# Patient Record
Sex: Female | Born: 1940 | Race: Black or African American | Hispanic: No
Health system: Midwestern US, Community
[De-identification: ages and names within clinical notes are randomized; demographics above are authoritative.]

## PROBLEM LIST (undated history)

## (undated) DIAGNOSIS — C801 Malignant (primary) neoplasm, unspecified: Secondary | ICD-10-CM

## (undated) DIAGNOSIS — I10 Essential (primary) hypertension: Secondary | ICD-10-CM

---

## 1998-04-27 ENCOUNTER — Ambulatory Visit (HOSPITAL_COMMUNITY): Admission: RE | Admit: 1998-04-27 | Discharge: 1998-04-27 | Payer: Self-pay | Admitting: Sports Medicine

## 1999-03-12 ENCOUNTER — Emergency Department (HOSPITAL_COMMUNITY): Admission: EM | Admit: 1999-03-12 | Discharge: 1999-03-12 | Payer: Self-pay | Admitting: Emergency Medicine

## 1999-03-25 ENCOUNTER — Encounter: Payer: Self-pay | Admitting: Neurological Surgery

## 1999-03-25 ENCOUNTER — Ambulatory Visit (HOSPITAL_COMMUNITY): Admission: RE | Admit: 1999-03-25 | Discharge: 1999-03-25 | Payer: Self-pay | Admitting: Neurological Surgery

## 1999-04-10 ENCOUNTER — Encounter: Payer: Self-pay | Admitting: Neurological Surgery

## 1999-04-12 ENCOUNTER — Inpatient Hospital Stay (HOSPITAL_COMMUNITY): Admission: RE | Admit: 1999-04-12 | Discharge: 1999-04-13 | Payer: Self-pay | Admitting: Neurological Surgery

## 1999-08-01 ENCOUNTER — Encounter: Admission: RE | Admit: 1999-08-01 | Discharge: 1999-10-09 | Payer: Self-pay | Admitting: Neurological Surgery

## 1999-10-08 ENCOUNTER — Encounter: Admission: RE | Admit: 1999-10-08 | Discharge: 1999-10-08 | Payer: Self-pay | Admitting: Neurological Surgery

## 1999-10-08 ENCOUNTER — Encounter: Payer: Self-pay | Admitting: Neurological Surgery

## 2002-11-01 ENCOUNTER — Ambulatory Visit (HOSPITAL_BASED_OUTPATIENT_CLINIC_OR_DEPARTMENT_OTHER): Admission: RE | Admit: 2002-11-01 | Discharge: 2002-11-01 | Payer: Self-pay | Admitting: Orthopedic Surgery

## 2003-02-09 ENCOUNTER — Encounter: Admission: RE | Admit: 2003-02-09 | Discharge: 2003-02-09 | Payer: Self-pay | Admitting: Plastic Surgery

## 2003-02-09 ENCOUNTER — Encounter: Payer: Self-pay | Admitting: Plastic Surgery

## 2003-05-14 ENCOUNTER — Ambulatory Visit (HOSPITAL_COMMUNITY): Admission: RE | Admit: 2003-05-14 | Discharge: 2003-05-14 | Payer: Self-pay | Admitting: Orthopedic Surgery

## 2003-05-14 ENCOUNTER — Encounter: Payer: Self-pay | Admitting: Orthopedic Surgery

## 2003-06-06 ENCOUNTER — Ambulatory Visit (HOSPITAL_BASED_OUTPATIENT_CLINIC_OR_DEPARTMENT_OTHER): Admission: RE | Admit: 2003-06-06 | Discharge: 2003-06-06 | Payer: Self-pay | Admitting: Orthopedic Surgery

## 2006-03-26 ENCOUNTER — Encounter: Payer: Self-pay | Admitting: Neurological Surgery

## 2016-08-05 ENCOUNTER — Telehealth: Payer: Self-pay | Admitting: *Deleted

## 2016-08-05 NOTE — Telephone Encounter (Signed)
Call from patient "asking if records have been received from [facility].  Asking if we are imaging or the hospital.  [place] sent records to [facility].  GI was to send records to your office.  I have bone cancer and need a shot." Called [facility]  No records received yet but [facility] will investigate.    [place] received with phone number for [facility] [phone].  Information provided to [facility]  [id number] "Have my records been received.  [facility] faxed them.  Are you with the hospital or imaging."  [facility] is alongside [facility] but are neither.  Provided [place] fax number [phone] at this time to ensure records sent to correct location.  I need a shot in my stomach but do not know the name of it.  I need it for bone cancer.  My doctor recommended a doctor with the letter 'M' and the letter 'G'.  The last injection I received was March 05, 2016.

## 2016-08-06 ENCOUNTER — Telehealth: Payer: Self-pay | Admitting: Hematology and Oncology

## 2016-08-06 ENCOUNTER — Telehealth: Payer: Self-pay | Admitting: *Deleted

## 2016-08-06 NOTE — Telephone Encounter (Signed)
"  I need to let Seth Bake know my PCP's name.  Dr. Micheal Likens in [place] I will keep until I get established with someone here.  His office needs the doctor's name, phone and fax information."  Informed [name] her insurance requires authorization, a referral from her PCP before she can be seen here.  Message also sent to new patient coordinator to contact patient.

## 2016-08-06 NOTE — Telephone Encounter (Signed)
Spoke to the patient to obtain her insurance information and to complete the pre-registration process. Appointment set for tomorrow, 9/13 at 345pm with Dr. Lindi Adie.

## 2016-08-07 ENCOUNTER — Telehealth: Payer: Self-pay | Admitting: Hematology and Oncology

## 2016-08-07 ENCOUNTER — Other Ambulatory Visit: Payer: Self-pay | Admitting: *Deleted

## 2016-08-07 ENCOUNTER — Ambulatory Visit: Payer: Self-pay | Admitting: Hematology and Oncology

## 2016-08-07 ENCOUNTER — Encounter: Payer: Self-pay | Admitting: Hematology and Oncology

## 2016-08-07 ENCOUNTER — Ambulatory Visit (HOSPITAL_BASED_OUTPATIENT_CLINIC_OR_DEPARTMENT_OTHER): Payer: Self-pay | Admitting: Hematology and Oncology

## 2016-08-07 DIAGNOSIS — C50919 Malignant neoplasm of unspecified site of unspecified female breast: Secondary | ICD-10-CM | POA: Insufficient documentation

## 2016-08-07 DIAGNOSIS — C7800 Secondary malignant neoplasm of unspecified lung: Secondary | ICD-10-CM

## 2016-08-07 DIAGNOSIS — C779 Secondary and unspecified malignant neoplasm of lymph node, unspecified: Secondary | ICD-10-CM

## 2016-08-07 DIAGNOSIS — C7951 Secondary malignant neoplasm of bone: Secondary | ICD-10-CM | POA: Insufficient documentation

## 2016-08-07 NOTE — Telephone Encounter (Signed)
appt made and avs printed °

## 2016-08-07 NOTE — Progress Notes (Signed)
Carla Roth CONSULT NOTE  No care team member to display  CHIEF COMPLAINTS/PURPOSE OF CONSULTATION:  Metastatic breast cancer, transfer of care from [place]:  Carla Roth 75 y.o. female is here because of recent diagnosis of metastatic breast cancer that was diagnosed when she presented to the hospital with a pathologic left hip fracture. She underwent left hip arthroplasty surgery and the tissue from the surgery revealed metastatic breast cancer that is ER/PR positive HER-2 negative. She had investigations which revealed a left breast lesion. She was started on letrozole after completion of palliative radiation therapy. She has been on letrozole for the past 2 years and does not appear to have any problems tolerating it. Further bone metastases she has been on Xgeva along with calcium and vitamin D. Her Lasix to according to the patient was done in March 2017. It was held at that time for dental issues.  Patient's daughter is a Leisure centre manager at [employer] and so she moved here to live with her daughter. She has 3 daughters and a son. Her son is an Ex-NFL player, 1 daughter was an Psychologist, prison and probation services who played track and field, 2 other daughters are Surveyor, quantity. She is extremely proud of their achievements.  I reviewed her records extensively and collaborated the history with the patient.  SUMMARY OF ONCOLOGIC HISTORY:   Metastatic breast cancer ([place])   08/03/2014 Initial Diagnosis    Left hip pathological fracture: Status post left hip arthroplasty, diagnosis metastatic breast cancer ER/PR positive HER-2 negative (at [facility])      09/06/2014 - 09/30/2014 Radiation Therapy    Palliative radiation to the left hip      10/03/2014 -  Anti-estrogen oral therapy    Letrozole 2.5 mg daily with denosumab every 3 months      03/12/2016 Imaging    Stable mediastinal and right axillary lymph nodes with stable bilateral lung nodules, left 12th rib lesion  stable      MEDICAL HISTORY:  Metastatic breast cancer and hypertension  SURGICAL HISTORY: Left axillary surgery for a granuloma Left hip arthroplasty for pathologic fracture 2015  SOCIAL HISTORY: Patient is divorced. She denies any tobacco or alcohol integration drug use FAMILY HISTORY: Denies any family history of breast cancer ALLERGIES:  has No Known Allergies.  MEDICATIONS:  Current Outpatient Prescriptions  Medication Sig Dispense Refill  . letrozole (FEMARA) 2.5 MG tablet TK 1 T PO D  3  . zolpidem (AMBIEN) 5 MG tablet TK 1 T PO QD  1   No current facility-administered medications for this visit.     REVIEW OF SYSTEMS:   Constitutional: Denies fevers, chills or abnormal night sweats Eyes: Denies blurriness of vision, double vision or watery eyes Ears, nose, mouth, throat, and face: Denies mucositis or sore throat Respiratory: Denies cough, dyspnea or wheezes Cardiovascular: Denies palpitation, chest discomfort or lower extremity swelling Gastrointestinal:  Denies nausea, heartburn or change in bowel habits Skin: Denies abnormal skin rashes Lymphatics: Denies new lymphadenopathy or easy bruising Neurological:Denies numbness, tingling or new weaknesses Behavioral/Psych: Mood is stable, no new changes  Breast:  Denies any palpable lumps or discharge All other systems were reviewed with the patient and are negative.  PHYSICAL EXAMINATION: ECOG PERFORMANCE STATUS: 0 - Asymptomatic  Vitals:   08/07/16 1539  BP: (!) 157/65  Pulse: 88  Resp: 20  Temp: 97.4 F (36.3 C)   Filed Weights   08/07/16 1539  Weight: 182 lb 1.6 oz (82.6 kg)  GENERAL:alert, no distress and comfortable SKIN: skin color, texture, turgor are normal, no rashes or significant lesions EYES: normal, conjunctiva are pink and non-injected, sclera clear OROPHARYNX:no exudate, no erythema and lips, buccal mucosa, and tongue normal  NECK: supple, thyroid normal size, non-tender, without  nodularity LYMPH:  no palpable lymphadenopathy in the cervical, axillary or inguinal LUNGS: clear to auscultation and percussion with normal breathing effort HEART: regular rate & rhythm and no murmurs and no lower extremity edema ABDOMEN:abdomen soft, non-tender and normal bowel sounds Musculoskeletal:no cyanosis of digits and no clubbing  PSYCH: alert & oriented x 3 with fluent speech NEURO: no focal motor/sensory deficits BREAST: No palpable nodules in breast. No palpable axillary or supraclavicular lymphadenopathy (exam performed in the presence of a chaperone)    RADIOGRAPHIC STUDIES: I have personally reviewed the radiological reports and agreed with the findings in the report.  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  Plan: 1. Continue letrozole 2. Rescan with CT chest abdomen pelvis and bone scan in October 2017 3. Xgeva every 3 months to start next week  Return to clinic after the CT scans and bone scan to review the results. If there are any signs of progression, we may have to add Ibrance. I spent over 2 hours reviewing the patient's chart from [place] in counseling and discussing the plan with her.   All questions were answered. The patient knows to call the clinic with any problems, questions or concerns.    Rulon Eisenmenger, MD 08/07/16

## 2016-08-07 NOTE — Assessment & Plan Note (Signed)
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  Plan: 1. Continue letrozole 2. Rescan with CT chest abdomen pelvis and bone scan in October 2017 3. Xgeva every 3 months to start next week  Return to clinic after the CT scans and bone scan to review the results. If there are any signs of progression, we may have to add Ibrance. I spent over 2 hours reviewing the patient's chart from [place] in counseling and discussing the plan with her.

## 2016-08-12 ENCOUNTER — Encounter: Payer: Self-pay | Admitting: Hematology and Oncology

## 2016-08-12 NOTE — Progress Notes (Signed)
Called patient and left voicemail on cell number listed with my contact name and number. Wanted to introduce myself as financial advocate and to inquire about her insurance coverage.

## 2016-08-14 ENCOUNTER — Ambulatory Visit (HOSPITAL_BASED_OUTPATIENT_CLINIC_OR_DEPARTMENT_OTHER): Payer: Self-pay

## 2016-08-14 ENCOUNTER — Other Ambulatory Visit: Payer: Self-pay

## 2016-08-14 ENCOUNTER — Other Ambulatory Visit (HOSPITAL_BASED_OUTPATIENT_CLINIC_OR_DEPARTMENT_OTHER): Payer: Self-pay

## 2016-08-14 ENCOUNTER — Encounter: Payer: Self-pay | Admitting: Hematology and Oncology

## 2016-08-14 VITALS — BP 145/66 | HR 90 | Temp 98.1°F | Resp 18

## 2016-08-14 DIAGNOSIS — C7951 Secondary malignant neoplasm of bone: Secondary | ICD-10-CM

## 2016-08-14 DIAGNOSIS — C50919 Malignant neoplasm of unspecified site of unspecified female breast: Secondary | ICD-10-CM

## 2016-08-14 LAB — CBC WITH DIFFERENTIAL/PLATELET
BASO%: 1.4 % (ref 0.0–2.0)
Basophils Absolute: 0.1 10*3/uL (ref 0.0–0.1)
EOS ABS: 0.8 10*3/uL — AB (ref 0.0–0.5)
EOS%: 12.5 % — ABNORMAL HIGH (ref 0.0–7.0)
HEMATOCRIT: 32.1 % — AB (ref 34.8–46.6)
HEMOGLOBIN: 10.5 g/dL — AB (ref 11.6–15.9)
LYMPH#: 2.1 10*3/uL (ref 0.9–3.3)
LYMPH%: 33.5 % (ref 14.0–49.7)
MCH: 27.8 pg (ref 25.1–34.0)
MCHC: 32.5 g/dL (ref 31.5–36.0)
MCV: 85.4 fL (ref 79.5–101.0)
MONO#: 0.4 10*3/uL (ref 0.1–0.9)
MONO%: 7.2 % (ref 0.0–14.0)
NEUT%: 45.4 % (ref 38.4–76.8)
NEUTROS ABS: 2.8 10*3/uL (ref 1.5–6.5)
PLATELETS: 309 10*3/uL (ref 145–400)
RBC: 3.76 10*6/uL (ref 3.70–5.45)
RDW: 14.2 % (ref 11.2–14.5)
WBC: 6.1 10*3/uL (ref 3.9–10.3)

## 2016-08-14 LAB — COMPREHENSIVE METABOLIC PANEL
ALT: 13 U/L (ref 0–55)
ANION GAP: 10 meq/L (ref 3–11)
AST: 19 U/L (ref 5–34)
Albumin: 3.5 g/dL (ref 3.5–5.0)
Alkaline Phosphatase: 211 U/L — ABNORMAL HIGH (ref 40–150)
BILIRUBIN TOTAL: 0.34 mg/dL (ref 0.20–1.20)
BUN: 12.9 mg/dL (ref 7.0–26.0)
CALCIUM: 9.5 mg/dL (ref 8.4–10.4)
CO2: 28 meq/L (ref 22–29)
CREATININE: 1 mg/dL (ref 0.6–1.1)
Chloride: 104 mEq/L (ref 98–109)
EGFR: 68 mL/min/{1.73_m2} — ABNORMAL LOW (ref >90)
Glucose: 109 mg/dl (ref 70–140)
Potassium: 4.3 mEq/L (ref 3.5–5.1)
Sodium: 142 mEq/L (ref 136–145)
TOTAL PROTEIN: 7.3 g/dL (ref 6.4–8.3)

## 2016-08-14 MED ORDER — DENOSUMAB 120 MG/1.7ML ~~LOC~~ SOLN
120.0000 mg | Freq: Once | SUBCUTANEOUS | Status: AC
  Administered 2016-08-14: 120 mg via SUBCUTANEOUS
  Filled 2016-08-14: qty 1.7

## 2016-08-14 NOTE — Progress Notes (Signed)
Patient returned call on 08/13/16. Patient wanted to confirm her appointment time for 08/14/16. I provided this information. I also asked patient if she has a new insurance card for 2017. Patient states she provided it during her recent visit. Advised patient that card was dated 04/10/2013 and the system did not recognize it. Patient states she had in her wallet one dated 71 and she knows she has received one for 2017 and she would call me back with the information from the card. Advised patient to present the current card at check-in and I would be able to view it once scanned. Patient verbalized understanding. Checked with Hoyle Sauer in registration whom checked in patient today for her visit to follow up on insurance. She states patient said she misplaced her card for 2017 and has requested a new one be sent. She was instructed to bring the card when she receives it.

## 2016-08-14 NOTE — Patient Instructions (Signed)
Denosumab injection  What is this medicine?  DENOSUMAB (den oh sue mab) slows bone breakdown. Prolia is used to treat osteoporosis in women after menopause and in men. Xgeva is used to prevent bone fractures and other bone problems caused by cancer bone metastases. Xgeva is also used to treat giant cell tumor of the bone.  This medicine may be used for other purposes; ask your health care provider or pharmacist if you have questions.  What should I tell my health care provider before I take this medicine?  They need to know if you have any of these conditions:  -dental disease  -eczema  -infection or history of infections  -kidney disease or on dialysis  -low blood calcium or vitamin D  -malabsorption syndrome  -scheduled to have surgery or tooth extraction  -taking medicine that contains denosumab  -thyroid or parathyroid disease  -an unusual reaction to denosumab, other medicines, foods, dyes, or preservatives  -pregnant or trying to get pregnant  -breast-feeding  How should I use this medicine?  This medicine is for injection under the skin. It is given by a health care professional in a hospital or clinic setting.  If you are getting Prolia, a special MedGuide will be given to you by the pharmacist with each prescription and refill. Be sure to read this information carefully each time.  For Prolia, talk to your pediatrician regarding the use of this medicine in children. Special care may be needed. For Xgeva, talk to your pediatrician regarding the use of this medicine in children. While this drug may be prescribed for children as young as 13 years for selected conditions, precautions do apply.  Overdosage: If you think you have taken too much of this medicine contact a poison control center or emergency room at once.  NOTE: This medicine is only for you. Do not share this medicine with others.  What if I miss a dose?  It is important not to miss your dose. Call your doctor or health care professional if you are  unable to keep an appointment.  What may interact with this medicine?  Do not take this medicine with any of the following medications:  -other medicines containing denosumab  This medicine may also interact with the following medications:  -medicines that suppress the immune system  -medicines that treat cancer  -steroid medicines like prednisone or cortisone  This list may not describe all possible interactions. Give your health care provider a list of all the medicines, herbs, non-prescription drugs, or dietary supplements you use. Also tell them if you smoke, drink alcohol, or use illegal drugs. Some items may interact with your medicine.  What should I watch for while using this medicine?  Visit your doctor or health care professional for regular checks on your progress. Your doctor or health care professional may order blood tests and other tests to see how you are doing.  Call your doctor or health care professional if you get a cold or other infection while receiving this medicine. Do not treat yourself. This medicine may decrease your body's ability to fight infection.  You should make sure you get enough calcium and vitamin D while you are taking this medicine, unless your doctor tells you not to. Discuss the foods you eat and the vitamins you take with your health care professional.  See your dentist regularly. Brush and floss your teeth as directed. Before you have any dental work done, tell your dentist you are receiving this medicine.  Do   not become pregnant while taking this medicine or for 5 months after stopping it. Women should inform their doctor if they wish to become pregnant or think they might be pregnant. There is a potential for serious side effects to an unborn child. Talk to your health care professional or pharmacist for more information.  What side effects may I notice from receiving this medicine?  Side effects that you should report to your doctor or health care professional as soon as  possible:  -allergic reactions like skin rash, itching or hives, swelling of the face, lips, or tongue  -breathing problems  -chest pain  -fast, irregular heartbeat  -feeling faint or lightheaded, falls  -fever, chills, or any other sign of infection  -muscle spasms, tightening, or twitches  -numbness or tingling  -skin blisters or bumps, or is dry, peels, or red  -slow healing or unexplained pain in the mouth or jaw  -unusual bleeding or bruising  Side effects that usually do not require medical attention (Report these to your doctor or health care professional if they continue or are bothersome.):  -muscle pain  -stomach upset, gas  This list may not describe all possible side effects. Call your doctor for medical advice about side effects. You may report side effects to FDA at 1-800-FDA-1088.  Where should I keep my medicine?  This medicine is only given in a clinic, doctor's office, or other health care setting and will not be stored at home.  NOTE: This sheet is a summary. It may not cover all possible information. If you have questions about this medicine, talk to your doctor, pharmacist, or health care provider.      2016, Elsevier/Gold Standard. (2012-05-11 12:37:47)

## 2016-09-06 ENCOUNTER — Telehealth: Payer: Self-pay

## 2016-09-06 NOTE — Telephone Encounter (Signed)
Received VM from pt stating she was told she had scans on 10/17 but had not heard anything further.  Upon chart review, it appears scans have been authorized but not scheduled.  Pt has an appt 10/17 to see Dr. Lindi Adie which will need to be adjusted accordingly to correlate with after scans obtained for review.  Called pt back but unable to reach pt.  Left VM with central scheduling contact information as well as our contact information should she have any questions or concerns.

## 2016-09-09 ENCOUNTER — Telehealth: Payer: Self-pay | Admitting: Medical Oncology

## 2016-09-09 NOTE — Telephone Encounter (Signed)
Sees [place] tomorrow .  No scans scheduled yet but they are ordered. She said she had scans [facility] [place] and she says it is too close to have scans now. She thinks she had CT CAP and bone scan . ( I can't fine it listed in care everywhere)  Please call her. [phone]

## 2016-09-10 ENCOUNTER — Encounter: Payer: Self-pay | Admitting: Hematology and Oncology

## 2016-09-10 ENCOUNTER — Ambulatory Visit (HOSPITAL_BASED_OUTPATIENT_CLINIC_OR_DEPARTMENT_OTHER): Payer: Self-pay | Admitting: Hematology and Oncology

## 2016-09-10 ENCOUNTER — Other Ambulatory Visit: Payer: Self-pay

## 2016-09-10 VITALS — BP 176/79 | HR 98 | Temp 98.0°F | Resp 19 | Wt 186.8 lb

## 2016-09-10 DIAGNOSIS — C50919 Malignant neoplasm of unspecified site of unspecified female breast: Secondary | ICD-10-CM

## 2016-09-10 DIAGNOSIS — C7800 Secondary malignant neoplasm of unspecified lung: Secondary | ICD-10-CM

## 2016-09-10 DIAGNOSIS — C7951 Secondary malignant neoplasm of bone: Secondary | ICD-10-CM

## 2016-09-10 DIAGNOSIS — C50912 Malignant neoplasm of unspecified site of left female breast: Secondary | ICD-10-CM

## 2016-09-10 MED ORDER — FUROSEMIDE 20 MG PO TABS: 20.0000 mg | ORAL_TABLET | Freq: Every day | ORAL | 1 refills | Status: AC

## 2016-09-10 NOTE — Telephone Encounter (Signed)
Per discussion with Dr. Lindi Adie after he saw pt in office today, pt will have scans done in April 2018.  Pt wishes for me to schedule these as she has had issues with scheduling in the past.  I have scheduled these as requested and called to inform her of this.  Sent pt calendar to her home with dates/times/and instructions for upcoming scans.  Informed managed care department that these scans will likely need to be re-authorized closer to scheduled date.  Pt verbalized understanding and is without further questions or concerns at time of call.

## 2016-09-10 NOTE — Progress Notes (Signed)
No care team member to display  DIAGNOSIS:  Encounter Diagnoses  Name Primary?  . Bone metastases ([place]) Yes  . Metastatic breast cancer ([place])     SUMMARY OF ONCOLOGIC HISTORY:   Metastatic breast cancer ([place])   08/03/2014 Initial Diagnosis    Left hip pathological fracture: Status post left hip arthroplasty, diagnosis metastatic breast cancer ER/PR positive HER-2 negative (at [facility])      09/06/2014 - 09/30/2014 Radiation Therapy    Palliative radiation to the left hip      10/03/2014 -  Anti-estrogen oral therapy    Letrozole 2.5 mg daily with denosumab every 3 months      03/12/2016 Imaging    Stable mediastinal and right axillary lymph nodes with stable bilateral lung nodules, left 12th rib lesion stable       CHIEF COMPLIANT: Follow-up of metastatic breast cancer on letrozole  INTERVAL HISTORY: Carla Roth is a 75 year old with above-mentioned history of trauma metastatic breast cancer with them lymph node and bone metastases along with bilateral lung nodules who was being treated at [facility] and is currently here for continuation of treatment. She has been tolerating letrozole fairly well. Initially we decided to do scans on her but patient elected to not do the scans at this time. She wishes to undergo scans 3 months from now. She denies any new problems or concerns.  REVIEW OF SYSTEMS:   Constitutional: Denies fevers, chills or abnormal weight loss Eyes: Denies blurriness of vision Ears, nose, mouth, throat, and face: Denies mucositis or sore throat Respiratory: Denies cough, dyspnea or wheezes Cardiovascular: Denies palpitation, chest discomfort Gastrointestinal:  Denies nausea, heartburn or change in bowel habits Skin: Denies abnormal skin rashes Lymphatics: Denies new lymphadenopathy or easy bruising Neurological:Denies numbness, tingling or new weaknesses Behavioral/Psych: Mood is stable, no new changes  Extremities: No lower extremity edema Breast:   denies any pain or lumps or nodules in either breasts All other systems were reviewed with the patient and are negative.  I have reviewed the past medical history, past surgical history, social history and family history with the patient and they are unchanged from previous note.  ALLERGIES:  has No Known Allergies.  MEDICATIONS:  Current Outpatient Prescriptions  Medication Sig Dispense Refill  . letrozole (FEMARA) 2.5 MG tablet TK 1 T PO D  3  . zolpidem (AMBIEN) 5 MG tablet TK 1 T PO QD  1   No current facility-administered medications for this visit.     PHYSICAL EXAMINATION: ECOG PERFORMANCE STATUS: 0 - Asymptomatic  Vitals:   09/10/16 0940  BP: (!) 176/79  Pulse: 98  Resp: 19  Temp: 98 F (36.7 C)   Filed Weights   09/10/16 0940  Weight: 186 lb 12.8 oz (84.7 kg)    GENERAL:alert, no distress and comfortable SKIN: skin color, texture, turgor are normal, no rashes or significant lesions EYES: normal, Conjunctiva are pink and non-injected, sclera clear OROPHARYNX:no exudate, no erythema and lips, buccal mucosa, and tongue normal  NECK: supple, thyroid normal size, non-tender, without nodularity LYMPH:  no palpable lymphadenopathy in the cervical, axillary or inguinal LUNGS: clear to auscultation and percussion with normal breathing effort HEART: regular rate & rhythm and no murmurs and no lower extremity edema ABDOMEN:abdomen soft, non-tender and normal bowel sounds MUSCULOSKELETAL:no cyanosis of digits and no clubbing  NEURO: alert & oriented x 3 with fluent speech, no focal motor/sensory deficits EXTREMITIES: No lower extremity edema  LABORATORY DATA:  I have reviewed the data as listed  Chemistry      Component Value Date/Time   NA 142 08/14/2016 0925   K 4.3 08/14/2016 0925   CO2 28 08/14/2016 0925   BUN 12.9 08/14/2016 0925   CREATININE 1.0 08/14/2016 0925      Component Value Date/Time   CALCIUM 9.5 08/14/2016 0925   ALKPHOS 211 (H) 08/14/2016 0925     AST 19 08/14/2016 0925   ALT 13 08/14/2016 0925   BILITOT 0.34 08/14/2016 0925       Lab Results  Component Value Date   WBC 6.1 08/14/2016   HGB 10.5 (L) 08/14/2016   HCT 32.1 (L) 08/14/2016   MCV 85.4 08/14/2016   PLT 309 08/14/2016   NEUTROABS 2.8 08/14/2016     ASSESSMENT & PLAN:  Metastatic breast cancer ([place]) Metastatic left breast cancer with bone, lymph node, 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  Plan: 1. Continue letrozole 2. patient feels clinically very well and does not have any symptoms. She would like to hold off on doing another scan until next year. Our plan is to do the scans in April 2018. 3. Xgeva every 3 months along with calcium and vitamin D Patient plans to bring her daughter who is a Leisure centre manager for volleyball at [employer]. She is also a 10 year breast cancer survivor. Her other daughter is at 78 PM who lives in [place] in track and field. Her son is also a Leisure centre manager for [employer] running back coach  Return to clinic in December for follow-up along with Xgeva injection. If there are any signs of progression, we may have to add Ibrance.   No orders of the defined types were placed in this encounter.  The patient has a good understanding of the overall plan. she agrees with it. she will call with any problems that may develop before the next visit here.   Rulon Eisenmenger, MD 09/10/16

## 2016-09-10 NOTE — Assessment & Plan Note (Signed)
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  Plan: 1. Continue letrozole 2. Rescan with CT chest abdomen pelvis and bone scan in 3 months 3. Xgeva every 3 months along with calcium and vitamin D  Return to clinic after the CT scans and bone scan to review the results. If there are any signs of progression, we may have to add Ibrance.

## 2016-09-24 ENCOUNTER — Ambulatory Visit (INDEPENDENT_AMBULATORY_CARE_PROVIDER_SITE_OTHER): Payer: Medicare HMO | Admitting: Physician Assistant

## 2016-09-24 DIAGNOSIS — Z23 Encounter for immunization: Secondary | ICD-10-CM | POA: Diagnosis not present

## 2016-10-13 ENCOUNTER — Emergency Department (HOSPITAL_COMMUNITY): Payer: Medicare HMO

## 2016-10-13 ENCOUNTER — Encounter (HOSPITAL_COMMUNITY): Payer: Self-pay | Admitting: Emergency Medicine

## 2016-10-13 ENCOUNTER — Inpatient Hospital Stay (HOSPITAL_COMMUNITY)
Admission: EM | Admit: 2016-10-13 | Discharge: 2016-10-15 | DRG: 389 | Disposition: A | Discharge status: Home / Self Care | Payer: Medicare HMO | Attending: Internal Medicine | Admitting: Internal Medicine

## 2016-10-13 DIAGNOSIS — Z79811 Long term (current) use of aromatase inhibitors: Secondary | ICD-10-CM | POA: Diagnosis not present

## 2016-10-13 DIAGNOSIS — Z90710 Acquired absence of both cervix and uterus: Secondary | ICD-10-CM

## 2016-10-13 DIAGNOSIS — C50919 Malignant neoplasm of unspecified site of unspecified female breast: Secondary | ICD-10-CM | POA: Diagnosis present

## 2016-10-13 DIAGNOSIS — Z170 Estrogen receptor positive status [ER+]: Secondary | ICD-10-CM

## 2016-10-13 DIAGNOSIS — K56609 Unspecified intestinal obstruction, unspecified as to partial versus complete obstruction: Secondary | ICD-10-CM | POA: Diagnosis not present

## 2016-10-13 DIAGNOSIS — I10 Essential (primary) hypertension: Secondary | ICD-10-CM | POA: Diagnosis present

## 2016-10-13 DIAGNOSIS — Z833 Family history of diabetes mellitus: Secondary | ICD-10-CM

## 2016-10-13 DIAGNOSIS — Z803 Family history of malignant neoplasm of breast: Secondary | ICD-10-CM

## 2016-10-13 DIAGNOSIS — Z96642 Presence of left artificial hip joint: Secondary | ICD-10-CM | POA: Diagnosis present

## 2016-10-13 DIAGNOSIS — C7951 Secondary malignant neoplasm of bone: Secondary | ICD-10-CM | POA: Diagnosis present

## 2016-10-13 DIAGNOSIS — R112 Nausea with vomiting, unspecified: Secondary | ICD-10-CM | POA: Diagnosis not present

## 2016-10-13 DIAGNOSIS — K565 Intestinal adhesions [bands], unspecified as to partial versus complete obstruction: Principal | ICD-10-CM | POA: Diagnosis present

## 2016-10-13 DIAGNOSIS — Z8249 Family history of ischemic heart disease and other diseases of the circulatory system: Secondary | ICD-10-CM

## 2016-10-13 DIAGNOSIS — Z0189 Encounter for other specified special examinations: Secondary | ICD-10-CM

## 2016-10-13 HISTORY — DX: Malignant (primary) neoplasm, unspecified: C80.1

## 2016-10-13 HISTORY — DX: Essential (primary) hypertension: I10

## 2016-10-13 LAB — COMPREHENSIVE METABOLIC PANEL
ALBUMIN: 4.6 g/dL (ref 3.5–5.0)
ALK PHOS: 160 U/L — AB (ref 38–126)
ALT: 15 U/L (ref 14–54)
ANION GAP: 9 (ref 5–15)
AST: 22 U/L (ref 15–41)
BILIRUBIN TOTAL: 0.8 mg/dL (ref 0.3–1.2)
BUN: 14 mg/dL (ref 6–20)
CALCIUM: 9.2 mg/dL (ref 8.9–10.3)
CO2: 26 mmol/L (ref 22–32)
CREATININE: 0.83 mg/dL (ref 0.44–1.00)
Chloride: 103 mmol/L (ref 101–111)
GFR calc Af Amer: >60 mL/min (ref >60)
GFR calc non Af Amer: >60 mL/min (ref >60)
GLUCOSE: 144 mg/dL — AB (ref 65–99)
Potassium: 3.8 mmol/L (ref 3.5–5.1)
Sodium: 138 mmol/L (ref 135–145)
TOTAL PROTEIN: 8.4 g/dL — AB (ref 6.5–8.1)

## 2016-10-13 LAB — CBC
HCT: 35.6 % — ABNORMAL LOW (ref 36.0–46.0)
Hemoglobin: 11.5 g/dL — ABNORMAL LOW (ref 12.0–15.0)
MCH: 27.3 pg (ref 26.0–34.0)
MCHC: 32.3 g/dL (ref 30.0–36.0)
MCV: 84.4 fL (ref 78.0–100.0)
PLATELETS: 405 10*3/uL — AB (ref 150–400)
RBC: 4.22 MIL/uL (ref 3.87–5.11)
RDW: 13.9 % (ref 11.5–15.5)
WBC: 10.7 10*3/uL — ABNORMAL HIGH (ref 4.0–10.5)

## 2016-10-13 LAB — LIPASE, BLOOD: Lipase: 20 U/L (ref 11–51)

## 2016-10-13 MED ORDER — SODIUM CHLORIDE 0.9 % IV SOLN
INTRAVENOUS | Status: DC
  Administered 2016-10-14: 01:00:00 via INTRAVENOUS

## 2016-10-13 MED ORDER — KETOROLAC TROMETHAMINE 15 MG/ML IJ SOLN
15.0000 mg | Freq: Four times a day (QID) | INTRAMUSCULAR | Status: DC | PRN
  Administered 2016-10-14 (×3): 15 mg via INTRAVENOUS
  Filled 2016-10-13 (×3): qty 1

## 2016-10-13 MED ORDER — ONDANSETRON HCL 4 MG/2ML IJ SOLN: 4.0000 mg | Freq: Four times a day (QID) | INTRAMUSCULAR | Status: DC | PRN

## 2016-10-13 MED ORDER — LIDOCAINE VISCOUS 2 % MT SOLN
15.0000 mL | Freq: Once | OROMUCOSAL | Status: AC
  Administered 2016-10-13: 15 mL via OROMUCOSAL
  Filled 2016-10-13: qty 15

## 2016-10-13 MED ORDER — ACETAMINOPHEN 650 MG RE SUPP: 650.0000 mg | Freq: Four times a day (QID) | RECTAL | Status: DC | PRN

## 2016-10-13 MED ORDER — HYDROMORPHONE HCL 1 MG/ML IJ SOLN
0.5000 mg | Freq: Once | INTRAMUSCULAR | Status: AC
Start: 2016-10-13 — End: 2016-10-13
  Administered 2016-10-13: 0.5 mg via INTRAVENOUS
  Filled 2016-10-13: qty 1

## 2016-10-13 MED ORDER — ENOXAPARIN SODIUM 40 MG/0.4ML ~~LOC~~ SOLN
40.0000 mg | SUBCUTANEOUS | Status: DC
  Filled 2016-10-13: qty 0.4

## 2016-10-13 MED ORDER — ACETAMINOPHEN-CODEINE #3 300-30 MG PO TABS
2.0000 | ORAL_TABLET | Freq: Once | ORAL | Status: AC
  Administered 2016-10-13: 2 via ORAL
  Filled 2016-10-13: qty 2

## 2016-10-13 MED ORDER — IOPAMIDOL (ISOVUE-300) INJECTION 61%
INTRAVENOUS | Status: AC
  Filled 2016-10-13: qty 100

## 2016-10-13 MED ORDER — ACETAMINOPHEN 325 MG PO TABS: 650.0000 mg | ORAL_TABLET | Freq: Four times a day (QID) | ORAL | Status: DC | PRN

## 2016-10-13 MED ORDER — ONDANSETRON HCL 4 MG PO TABS: 4.0000 mg | ORAL_TABLET | Freq: Four times a day (QID) | ORAL | Status: DC | PRN

## 2016-10-13 MED ORDER — IOPAMIDOL (ISOVUE-300) INJECTION 61%
100.0000 mL | Freq: Once | INTRAVENOUS | Status: AC | PRN
  Administered 2016-10-13: 100 mL via INTRAVENOUS

## 2016-10-13 MED ORDER — LABETALOL HCL 5 MG/ML IV SOLN
10.0000 mg | INTRAVENOUS | Status: DC | PRN
  Filled 2016-10-13: qty 4

## 2016-10-13 NOTE — ED Notes (Signed)
Pt states she is still unable to provide UA.

## 2016-10-13 NOTE — ED Provider Notes (Signed)
[facility] DEPT Provider Note   CSN: [id number] Arrival date & time: 10/13/16  1746     History   Chief Complaint Chief Complaint  Patient presents with  . Abdominal Pain    HPI Carla Roth is a 75 y.o. female.  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  She reports associated nausea, and vomiting.  She states that the symptoms started around noon today.  She reports a history of SBO.  She denies any fevers, chills, diarrhea, or dysuria.  There are no other associated symptoms.  There are no modifying factors.  She states that she has not taken anything for her symptoms.   The history is provided by the patient. No language interpreter was used.    Past Medical History:  Diagnosis Date  . Cancer (HCC)    Bone  . Hypertension     Patient Active Problem List   Diagnosis Date Noted  . Metastatic breast cancer ([place]) 08/07/2016  . Bone metastases ([place]) 08/07/2016    History reviewed. No pertinent surgical history.  OB History    No data available       Home Medications    Prior to Admission medications   Medication Sig Start Date End Date Taking? Authorizing Provider  furosemide (LASIX) 20 MG tablet Take 1 tablet (20 mg total) by mouth daily. 09/10/16   Nicholas Lose, MD  letrozole (FEMARA) 2.5 MG tablet TK 1 T PO D 08/04/16   Historical Provider, MD  lisinopril (PRINIVIL,ZESTRIL) 2.5 MG tablet Take 2.5 mg by mouth daily. 09/02/16   Historical Provider, MD  zolpidem (AMBIEN) 5 MG tablet TK 1 T PO QD 07/31/16   Historical Provider, MD    Family History No family history on file.  Social History Social History  Substance Use Topics  . Smoking status: Never Smoker  . Smokeless tobacco: Never Used  . Alcohol use No     Allergies   Patient has no known allergies.   Review of Systems Review of Systems  Gastrointestinal: Positive for abdominal pain, nausea and vomiting.  All  other systems reviewed and are negative.    Physical Exam Updated Vital Signs BP (!) 210/87 (BP Location: Right Arm)   Pulse 89   Temp 98.2 F (36.8 C) (Oral)   Resp 18   Ht 5\' 6"  (1.676 m)   Wt 77.1 kg   SpO2 100%   BMI 27.44 kg/m   Physical Exam  Constitutional: She is oriented to person, place, and time. She appears well-developed and well-nourished.  HENT:  Head: Normocephalic and atraumatic.  Eyes: Conjunctivae and EOM are normal. Pupils are equal, round, and reactive to light.  Neck: Normal range of motion. Neck supple.  Cardiovascular: Normal rate and regular rhythm.  Exam reveals no gallop and no friction rub.   No murmur heard. Pulmonary/Chest: Effort normal and breath sounds normal. No respiratory distress. She has no wheezes. She has no rales. She exhibits no tenderness.  Abdominal: Soft. Bowel sounds are normal. She exhibits no distension and no mass. There is no tenderness. There is no rebound and no guarding.  Vague diffuse abdominal discomfort  Musculoskeletal: Normal range of motion. She exhibits no edema or tenderness.  Neurological: She is alert and oriented to person, place, and time.  Skin: Skin is warm and dry.  Psychiatric: She has a normal mood and affect. Her behavior is normal. Judgment and thought content normal.  Nursing note and vitals reviewed.    ED Treatments / Results  Labs (all labs ordered are listed, but only abnormal results are displayed) Labs Reviewed  COMPREHENSIVE METABOLIC PANEL - Abnormal; Notable for the following:       Result Value   Glucose, Bld 144 (*)    Total Protein 8.4 (*)    Alkaline Phosphatase 160 (*)    All other components within normal limits  CBC - Abnormal; Notable for the following:    WBC 10.7 (*)    Hemoglobin 11.5 (*)    HCT 35.6 (*)    Platelets 405 (*)    All other components within normal limits  LIPASE, BLOOD  URINALYSIS, ROUTINE W REFLEX MICROSCOPIC (NOT AT [facility])    EKG  EKG  Interpretation None       Radiology Ct Abdomen Pelvis W Contrast  Result Date: 10/13/2016 CLINICAL DATA:  Abdominal pain with nausea and vomiting. History of breast carcinoma EXAM: CT ABDOMEN AND PELVIS WITH CONTRAST TECHNIQUE: Multidetector CT imaging of the abdomen and pelvis was performed using the standard protocol following bolus administration of intravenous contrast. CONTRAST:  17mL ISOVUE-300 IOPAMIDOL (ISOVUE-300) INJECTION 61% COMPARISON:  None. FINDINGS: Lower chest: Lung bases are clear. There is a fairly small focal hiatal hernia. Hepatobiliary: Liver measures 19.0 cm in length. There is hepatic steatosis. No focal liver lesions are evident. Gallbladder is mildly distended without appreciable wall thickening. There is no biliary duct dilatation. Pancreas: There is fatty replacement within the pancreas. There is no pancreatic mass or inflammatory focus. Spleen: No splenic lesions are evident. Adrenals/Urinary Tract: Adrenals appear normal bilaterally. There is a cyst in the lower pole of the right kidney measuring 2.0 x 1.5 cm. There is no evident hydronephrosis on either side. There is no renal or ureteral calculus on either side. Urinary bladder is midline with wall thickness within normal limits. Stomach/Bowel: There is bowel dilatation throughout much of the jejunum. There is a transition zone in the mid abdomen just to the left of midline in the distal jejunal region consistent with a degree of bowel obstruction. There is no appreciable free air or portal venous air. Most of the dilated loops of bowel containing fluid. There is no mesenteric or bowel wall thickening. Vascular/Lymphatic: There is atherosclerotic calcification in the aorta and proximal common iliac arteries, slightly more on the right than on the left. There is no abdominal aortic aneurysm. The major mesenteric vessels appear patent. There is no appreciable adenopathy in the abdomen or pelvis. Reproductive: Uterus is  absent. There is a small amount of free fluid in the dependent portion of the pelvis. No pelvic mass evident. Other: Appendix appears normal. No abscess in the abdomen or pelvis. There is a minimal ventral hernia containing only fat. Musculoskeletal: Patient is status post total hip replacement on the left. There is degenerative change in the lumbar spine. There is lytic change in the left twelfth rib, concerning for metastatic disease. A similar appearing lytic lesion is also noted in posterior right ninth rib. IMPRESSION: Evidence of small bowel obstruction with transition zone just to the left of midline in the distal jejunal region. No free air. No abscess. Appendix appears normal. 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 Patient is status post total hip replacement on the left. It may be prudent to correlate with nuclear medicine bone scan to assess for other potential sites of bony metastatic disease. Fairly small hiatal hernia present.  Rather minimal ventral hernia.  Prominent liver with hepatic steatosis. No focal liver lesions evident. Gallbladder borderline distended without wall thickening. No renal or ureteral calculus.  No hydronephrosis. Small amount of free fluid in the dependent portion of pelvis. Etiology uncertain. This finding may be due to reaction from small bowel obstruction. Uterus absent. Electronically Signed   By: Lowella Grip III M.D.   On: 10/13/2016 20:05    Procedures Procedures (including critical care time)  Medications Ordered in ED Medications  lidocaine (XYLOCAINE) 2 % viscous mouth solution 15 mL (not administered)  acetaminophen-codeine (TYLENOL #3) 300-30 MG per tablet 2 tablet (2 tablets Oral Given 10/13/16 1920)  iopamidol (ISOVUE-300) 61 % injection 100 mL ( Intravenous Canceled Entry 10/13/16 2008)  HYDROmorphone (DILAUDID) injection 0.5 mg (0.5 mg Intravenous Given 10/13/16 2127)     Initial Impression / Assessment and Plan /  ED Course  I have reviewed the triage vital signs and the nursing notes.  Pertinent labs & imaging results that were available during my care of the patient were reviewed by me and considered in my medical decision making (see chart for details).  Clinical Course     Patient request Tylenol 3 for pain.  She states that she doesn't trust anyone or any unfamiliar drugs.  Patient will need CT to evaluate abdominal pain.  CT consistent with SBO. Will consult general surgery.  Appreciate telephone consultation with Dr. Zella Richer, who recommends trying for medical management with NG tube at this point.  This has happened to the patient once in the past, and was treated medically at that time.  CCS will consult in the morning.  Appreciate Dr. Roel Cluck for admitting the patient.   Final Clinical Impressions(s) / ED Diagnoses   Final diagnoses:  SBO (small bowel obstruction)    New Prescriptions New Prescriptions   No medications on file     Montine Circle, PA-C 10/13/16 2144    Duffy Bruce, MD 10/15/16 [phone]

## 2016-10-13 NOTE — ED Notes (Signed)
Pt cannot use restroom at this time, aware urine specimen is needed.  

## 2016-10-13 NOTE — H&P (Signed)
Carla Roth [id number] DOB: March 22, 1941 DOA: 10/13/2016     PCP: No PCP Per Patient   Outpatient Specialists: oncology [facility]   Patient coming from:   home Lives  With family    Chief Complaint: Nausea vomiting abdominal distention  HPI: Carla Roth is a 75 y.o. female with medical history significant of metastatic breast cancer, pathologic left hip fracture, hip replacement 2015    Presented with sudden onset of abdominal distention and pain started today associated nausea and vomiting no diarrhea. Patient has history of small bowel obstructions in the past. She has history of hysterectomy. Any passive small bowel obstruction was treated conservatively good results  Denies any fevers or chills no chest pain or shortness of breath.  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 She has been on letrozole for the past 2 years she developed other bone metastasis to the ribs being treated with  Xgeva along with calcium and vitamin D.  IN ER:  Temp (24hrs), Avg:98.2 F (36.8 C), Min:98.2 F (36.8 C), Max:98.2 F (36.8 C)        Following Medications were ordered in ER: Medications  lidocaine (XYLOCAINE) 2 % viscous mouth solution 15 mL (not administered)  acetaminophen-codeine (TYLENOL #3) 300-30 MG per tablet 2 tablet (2 tablets Oral Given 10/13/16 1920)  iopamidol (ISOVUE-300) 61 % injection 100 mL ( Intravenous Canceled Entry 10/13/16 2008)  HYDROmorphone (DILAUDID) injection 0.5 mg (0.5 mg Intravenous Given 10/13/16 2127)     ER provider discussed case with: General surgery Dr. Maye Hides who recommended NG tube placement and will see patient in consult tomorrow for now admit to medicine for conservative management  Hospitalist was called for  admission for small bowel obstruction  Review of Systems:    Pertinent positives include:  abdominal pain, nausea, vomiting,  Constitutional:  No weight loss, night sweats, Fevers, chills, fatigue, weight loss  HEENT:  No headaches, Difficulty swallowing,Tooth/dental problems,Sore throat,  No sneezing, itching, ear ache, nasal congestion, post nasal drip,  Cardio-vascular:  No chest pain, Orthopnea, PND, anasarca, dizziness, palpitations.no Bilateral lower extremity swelling  GI:  No heartburn, indigestion, diarrhea, change in bowel habits, loss of appetite, melena, blood in stool, hematemesis Resp:  no shortness of breath at rest. No dyspnea on exertion, No excess mucus, no productive cough, No non-productive cough, No coughing up of blood.No change in color of mucus.No wheezing. Skin:  no rash or lesions. No jaundice GU:  no dysuria, change in color of urine, no urgency or frequency. No straining to urinate.  No flank pain.  Musculoskeletal:  No joint pain or no joint swelling. No decreased range of motion. No back pain.  Psych:  No change in mood or affect. No depression or anxiety. No memory loss.  Neuro: no localizing neurological complaints, no tingling, no weakness, no double vision, no gait abnormality, no slurred speech, no confusion  As per HPI otherwise 10 point review of systems negative.   Past Medical History: Past Medical History:  Diagnosis Date  . Cancer (HCC)    Bone  . Hypertension    History reviewed. No pertinent surgical history.   Social History:  Ambulatory  independent   reports that she has never smoked. She has never used smokeless tobacco. She reports that she does not drink alcohol. Her drug history is  not on file.  Allergies:  No Known Allergies     Family History:   Family History  Problem Relation Age of Onset  . Breast cancer Other     Medications: Prior to Admission medications   Medication Sig Start Date End Date Taking?  Authorizing Provider  furosemide (LASIX) 20 MG tablet Take 1 tablet (20 mg total) by mouth daily. 09/10/16  Yes Nicholas Lose, MD  letrozole (FEMARA) 2.5 MG tablet 2.5 mg once daily   Yes Historical Provider, MD  lisinopril (PRINIVIL,ZESTRIL) 2.5 MG tablet Take 2.5 mg by mouth daily. 09/02/16  Yes Historical Provider, MD  Multiple Vitamin (MULTIVITAMIN WITH MINERALS) TABS tablet Take 2 tablets by mouth daily.   Yes Historical Provider, MD  naproxen sodium (ANAPROX) 220 MG tablet Take 440 mg by mouth 2 (two) times daily with a meal.   Yes Historical Provider, MD  zolpidem (AMBIEN) 5 MG tablet 55m at bedtime for sleep 07/31/16  Yes Historical Provider, MD    Physical Exam: Patient Vitals for the past 24 hrs:  BP Temp Temp src Pulse Resp SpO2 Height Weight  10/13/16 1934 186/76 - - 88 18 100 % - -  10/13/16 1755 - - - - - - 5' 6"  (1.676 m) 77.1 kg (170 lb)  10/13/16 1753 (!) 210/87 98.2 F (36.8 C) Oral 89 18 100 % - -    1. General:  in No Acute distress 2. Psychological: Alert and  Oriented 3. Head/ENT:     Dry Mucous Membranes                     NG in place                          Head Non traumatic, neck supple                          Normal   Dentition 4. SKIN:  decreased Skin turgor,  Skin clean Dry and intact no rash 5. Heart: Regular rate and rhythm no Murmur, Rub or gallop 6. Lungs:   no wheezes or crackles   7. Abdomen:   tender distended 8. Lower extremities: no clubbing, cyanosis, or edema 9. Neurologically Grossly intact, moving all 4 extremities equally   10. MSK: Normal range of motion   body mass index is 27.44 kg/m.  Labs on Admission:   Labs on Admission: I have personally reviewed following labs and imaging studies  CBC:  Recent Labs Lab 10/13/16 1800  WBC 10.7*  HGB 11.5*  HCT 35.6*  MCV 84.4  PLT 4253   Basic Metabolic Panel:  Recent Labs Lab 10/13/16 1800  NA 138  K 3.8  CL 103  CO2 26  GLUCOSE 144*  BUN 14  CREATININE 0.83  CALCIUM 9.2    GFR: Estimated Creatinine Clearance: 63.3 mL/min (by C-G formula based on SCr of 0.83 mg/dL). Liver Function Tests:  Recent Labs Lab 10/13/16 1800  AST 22  ALT 15  ALKPHOS 160*  BILITOT 0.8  PROT 8.4*  ALBUMIN 4.6    Recent Labs Lab 10/13/16 1800  LIPASE 20   No results for input(s): AMMONIA in the last 168 hours. Coagulation Profile: No results for input(s): INR, PROTIME in the last 168 hours. Cardiac Enzymes: No results for input(s): CKTOTAL, CKMB, CKMBINDEX, TROPONINI in the last 168 hours. BNP (last 3 results) No results for input(s): PROBNP in the last  8760 hours. HbA1C: No results for input(s): HGBA1C in the last 72 hours. CBG: No results for input(s): GLUCAP in the last 168 hours. Lipid Profile: No results for input(s): CHOL, HDL, LDLCALC, TRIG, CHOLHDL, LDLDIRECT in the last 72 hours. Thyroid Function Tests: No results for input(s): TSH, T4TOTAL, FREET4, T3FREE, THYROIDAB in the last 72 hours. Anemia Panel: No results for input(s): VITAMINB12, FOLATE, FERRITIN, TIBC, IRON, RETICCTPCT in the last 72 hours. Urine analysis: No results found for: COLORURINE, APPEARANCEUR, LABSPEC, PHURINE, GLUCOSEU, HGBUR, BILIRUBINUR, KETONESUR, PROTEINUR, UROBILINOGEN, NITRITE, LEUKOCYTESUR Sepsis Labs: @LABRCNTIP (procalcitonin:4,lacticidven:4) )No results found for this or any previous visit (from the past 240 hour(s)).     UA not ordered  No results found for: HGBA1C  Estimated Creatinine Clearance: 63.3 mL/min (by C-G formula based on SCr of 0.83 mg/dL).  BNP (last 3 results) No results for input(s): PROBNP in the last 8760 hours.   ECG REPORT Not obtained  Filed Weights   10/13/16 1755  Weight: 77.1 kg (170 lb)     Cultures: No results found for: SDES, SPECREQUEST, CULT, REPTSTATUS   Radiological Exams on Admission: Ct Abdomen Pelvis W Contrast  Result Date: 10/13/2016 CLINICAL DATA:  Abdominal pain with nausea and vomiting. History of breast  carcinoma EXAM: CT ABDOMEN AND PELVIS WITH CONTRAST TECHNIQUE: Multidetector CT imaging of the abdomen and pelvis was performed using the standard protocol following bolus administration of intravenous contrast. CONTRAST:  162m ISOVUE-300 IOPAMIDOL (ISOVUE-300) INJECTION 61% COMPARISON:  None. FINDINGS: Lower chest: Lung bases are clear. There is a fairly small focal hiatal hernia. Hepatobiliary: Liver measures 19.0 cm in length. There is hepatic steatosis. No focal liver lesions are evident. Gallbladder is mildly distended without appreciable wall thickening. There is no biliary duct dilatation. Pancreas: There is fatty replacement within the pancreas. There is no pancreatic mass or inflammatory focus. Spleen: No splenic lesions are evident. Adrenals/Urinary Tract: Adrenals appear normal bilaterally. There is a cyst in the lower pole of the right kidney measuring 2.0 x 1.5 cm. There is no evident hydronephrosis on either side. There is no renal or ureteral calculus on either side. Urinary bladder is midline with wall thickness within normal limits. Stomach/Bowel: There is bowel dilatation throughout much of the jejunum. There is a transition zone in the mid abdomen just to the left of midline in the distal jejunal region consistent with a degree of bowel obstruction. There is no appreciable free air or portal venous air. Most of the dilated loops of bowel containing fluid. There is no mesenteric or bowel wall thickening. Vascular/Lymphatic: There is atherosclerotic calcification in the aorta and proximal common iliac arteries, slightly more on the right than on the left. There is no abdominal aortic aneurysm. The major mesenteric vessels appear patent. There is no appreciable adenopathy in the abdomen or pelvis. Reproductive: Uterus is absent. There is a small amount of free fluid in the dependent portion of the pelvis. No pelvic mass evident. Other: Appendix appears normal. No abscess in the abdomen or pelvis.  There is a minimal ventral hernia containing only fat. Musculoskeletal: Patient is status post total hip replacement on the left. There is degenerative change in the lumbar spine. There is lytic change in the left twelfth rib, concerning for metastatic disease. A similar appearing lytic lesion is also noted in posterior right ninth rib. IMPRESSION: Evidence of small bowel obstruction with transition zone just to the left of midline in the distal jejunal region. No free air. No abscess. Appendix appears normal. Bony metastases in  the posterior right ninth and left twelfth ribs. Patient is status post total hip replacement on the left. It may be prudent to correlate with nuclear medicine bone scan to assess for other potential sites of bony metastatic disease. Fairly small hiatal hernia present.  Rather minimal ventral hernia. Prominent liver with hepatic steatosis. No focal liver lesions evident. Gallbladder borderline distended without wall thickening. No renal or ureteral calculus.  No hydronephrosis. Small amount of free fluid in the dependent portion of pelvis. Etiology uncertain. This finding may be due to reaction from small bowel obstruction. Uterus absent. Electronically Signed   By: Lowella Grip III M.D.   On: 10/13/2016 20:05    Chart has been reviewed    Assessment/Plan  75 y.o. female with medical history significant of metastatic breast cancer, pathologic left hip fracture, hip replacement being admitted for small bowel obstruction for conservative management  Present on Admission: . Small bowel obstruction - most likely cause being adhesions, prior history of intra-abdominal operation. conservative management,   appreciate surgical Consult, keep nothing by mouth, NG tube in place, order KUB in a.m. . Accelerated hypertension in the setting of pain and inability to tolerate home medications will give labetalol when necessary hold by mouth meds for now . Metastatic breast cancer ([place])  while nothing by mouth Will hold oral therapy appears to be stable follow-up with oncology   Other plan as per orders.  DVT prophylaxis:  Lovenox     Code Status:  FULL CODE   as per patient   Family Communication:   Family   at  Bedside  plan of care was discussed with  Daughter  Disposition Plan:     To home once workup is complete and patient is stable                              Consults called: General surgery Dr. Zella Richer  Admission status:   inpatient     Level of care      medical floor           I have spent a total of 56 min on this admission    Carla Roth 10/13/2016, 10:32 PM    [facility]  Pager [phone]   after 2 AM please page floor coverage PA If 7AM-7PM, please contact the day team taking care of the patient  [email or web address]  Password [email or web address]

## 2016-10-13 NOTE — ED Notes (Addendum)
Attempt NG placement in R and then in L nare with 88F NG tube.  Met resistance and pt became anxious and upset.  Ordered 75F NG tube and notified pt of plan to place a smaller NG.  Pt has family support at the bedside and voices understanding but would prefer to "be put to sleep to have the NG placed as was done last time"

## 2016-10-13 NOTE — ED Triage Notes (Signed)
Patient reports abdominal starting starting around noon today. Reports nausea and emesis. Denies diarrhea.

## 2016-10-13 NOTE — Consult Note (Signed)
Reason for Consult:Small bowel obstruction Referring Physician:  Montine Circle, PA  Jhordan Carla Roth is an 75 y.o. female.  HPI: She was in her normal state of health until this afternoon when she had abrupt onset of sharp pain with abdominal distention and nausea and vomiting. She did have a bowel movement today. The pain continued along with nausea and vomiting and she presented to the emergency department. She was evaluated by physical exam and CT scan. Findings of the CT scan were consistent with a small bowel obstruction. We were asked to see her for this reason. She has had this one time in the past which was treated successfully nonoperatively. Previous abdominal surgery includes a partial hysterectomy. 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  Past Medical History:  Diagnosis Date  . Cancer (HCC)    Bone  . Hypertension    PSH:  Partial hysterectomy. Left hip arthroplasty.   Family History  Problem Relation Age of Onset  . CAD Mother   . Diabetes Father   . Breast cancer Other   . Diabetes Sister   . Diabetes Brother     Social History:  reports that she has never smoked. She has never used smokeless tobacco. She reports that she does not drink alcohol or use drugs.  Allergies: No Known Allergies  Prior to Admission medications   Medication Sig Start Date End Date Taking? Authorizing Provider  furosemide (LASIX) 20 MG tablet Take 1 tablet (20 mg total) by mouth daily. Patient taking differently: Take 20 mg by mouth as needed.  09/10/16  Yes Nicholas Lose, MD  letrozole (FEMARA) 2.5 MG tablet 2.5 mg once daily   Yes Historical Provider, MD  lisinopril (PRINIVIL,ZESTRIL) 2.5 MG tablet Take 2.5 mg by mouth daily. 09/02/16  Yes Historical Provider, MD  Multiple Vitamin (MULTIVITAMIN WITH MINERALS) TABS tablet Take 2 tablets by mouth daily.   Yes Historical Provider, MD  naproxen sodium (ANAPROX) 220 MG tablet Take 440 mg by mouth 2 (two) times daily with a meal.   Yes  Historical Provider, MD  zolpidem (AMBIEN) 5 MG tablet 8m at bedtime for sleep 07/31/16  Yes Historical Provider, MD     Results for orders placed or performed during the hospital encounter of 10/13/16 (from the past 48 hour(s))  Lipase, blood     Status: None   Collection Time: 10/13/16  6:00 PM  Result Value Ref Range   Lipase 20 11 - 51 U/L  Comprehensive metabolic panel     Status: Abnormal   Collection Time: 10/13/16  6:00 PM  Result Value Ref Range   Sodium 138 135 - 145 mmol/L   Potassium 3.8 3.5 - 5.1 mmol/L   Chloride 103 101 - 111 mmol/L   CO2 26 22 - 32 mmol/L   Glucose, Bld 144 (H) 65 - 99 mg/dL   BUN 14 6 - 20 mg/dL   Creatinine, Ser 0.83 0.44 - 1.00 mg/dL   Calcium 9.2 8.9 - 10.3 mg/dL   Total Protein 8.4 (H) 6.5 - 8.1 g/dL   Albumin 4.6 3.5 - 5.0 g/dL   AST 22 15 - 41 U/L   ALT 15 14 - 54 U/L   Alkaline Phosphatase 160 (H) 38 - 126 U/L   Total Bilirubin 0.8 0.3 - 1.2 mg/dL   GFR calc non Af Amer >60 >60 mL/min   GFR calc Af Amer >60 >60 mL/min    Comment: (NOTE) The eGFR has been calculated using the CKD EPI equation.  This calculation has not been validated in all clinical situations. eGFR's persistently <60 mL/min signify possible Chronic Kidney Disease.    Anion gap 9 5 - 15  CBC     Status: Abnormal   Collection Time: 10/13/16  6:00 PM  Result Value Ref Range   WBC 10.7 (H) 4.0 - 10.5 K/uL   RBC 4.22 3.87 - 5.11 MIL/uL   Hemoglobin 11.5 (L) 12.0 - 15.0 g/dL   HCT 35.6 (L) 36.0 - 46.0 %   MCV 84.4 78.0 - 100.0 fL   MCH 27.3 26.0 - 34.0 pg   MCHC 32.3 30.0 - 36.0 g/dL   RDW 13.9 11.5 - 15.5 %   Platelets 405 (H) 150 - 400 K/uL    Ct Abdomen Pelvis W Contrast  Result Date: 10/13/2016 CLINICAL DATA:  Abdominal pain with nausea and vomiting. History of breast carcinoma EXAM: CT ABDOMEN AND PELVIS WITH CONTRAST TECHNIQUE: Multidetector CT imaging of the abdomen and pelvis was performed using the standard protocol following bolus administration of  intravenous contrast. CONTRAST:  163m ISOVUE-300 IOPAMIDOL (ISOVUE-300) INJECTION 61% COMPARISON:  None. FINDINGS: Lower chest: Lung bases are clear. There is a fairly small focal hiatal hernia. Hepatobiliary: Liver measures 19.0 cm in length. There is hepatic steatosis. No focal liver lesions are evident. Gallbladder is mildly distended without appreciable wall thickening. There is no biliary duct dilatation. Pancreas: There is fatty replacement within the pancreas. There is no pancreatic mass or inflammatory focus. Spleen: No splenic lesions are evident. Adrenals/Urinary Tract: Adrenals appear normal bilaterally. There is a cyst in the lower pole of the right kidney measuring 2.0 x 1.5 cm. There is no evident hydronephrosis on either side. There is no renal or ureteral calculus on either side. Urinary bladder is midline with wall thickness within normal limits. Stomach/Bowel: There is bowel dilatation throughout much of the jejunum. There is a transition zone in the mid abdomen just to the left of midline in the distal jejunal region consistent with a degree of bowel obstruction. There is no appreciable free air or portal venous air. Most of the dilated loops of bowel containing fluid. There is no mesenteric or bowel wall thickening. Vascular/Lymphatic: There is atherosclerotic calcification in the aorta and proximal common iliac arteries, slightly more on the right than on the left. There is no abdominal aortic aneurysm. The major mesenteric vessels appear patent. There is no appreciable adenopathy in the abdomen or pelvis. Reproductive: Uterus is absent. There is a small amount of free fluid in the dependent portion of the pelvis. No pelvic mass evident. Other: Appendix appears normal. No abscess in the abdomen or pelvis. There is a minimal ventral hernia containing only fat. Musculoskeletal: Patient is status post total hip replacement on the left. There is degenerative change in the lumbar spine. There is  lytic change in the left twelfth rib, concerning for metastatic disease. A similar appearing lytic lesion is also noted in posterior right ninth rib. IMPRESSION: Evidence of small bowel obstruction with transition zone just to the left of midline in the distal jejunal region. No free air. No abscess. Appendix appears normal. 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 Patient is status post total hip replacement on the left. It may be prudent to correlate with nuclear medicine bone scan to assess for other potential sites of bony metastatic disease. Fairly small hiatal hernia present.  Rather minimal ventral hernia. Prominent liver with hepatic steatosis. No focal liver lesions evident. Gallbladder borderline distended without  wall thickening. No renal or ureteral calculus.  No hydronephrosis. Small amount of free fluid in the dependent portion of pelvis. Etiology uncertain. This finding may be due to reaction from small bowel obstruction. Uterus absent. Electronically Signed   By: Lowella Grip III M.D.   On: 10/13/2016 20:05   Dg Abd Portable 1v  Result Date: 10/13/2016 CLINICAL DATA:  NG tube placement EXAM: PORTABLE ABDOMEN - 1 VIEW COMPARISON:  CT abdomen pelvis 10/13/2016 FINDINGS: NG tube is coiled in the region of the gastroesophageal junction. The tip and side port are below the diaphragm. There is a normal bowel gas pattern. Excreted IV contrast is seen within the renal calices. IMPRESSION: NG tube coiled in the region of the gastroesophageal junction. Tip and side port are below the diaphragm. Electronically Signed   By: Ulyses Jarred M.D.   On: 10/13/2016 22:43    Review of Systems  Constitutional: Negative for chills and fever.  Respiratory: Negative for shortness of breath.   Cardiovascular: Negative for chest pain.  Gastrointestinal: Positive for abdominal pain, nausea and vomiting. Negative for diarrhea.  Musculoskeletal: Negative for back pain.   Neurological: Negative for seizures.  Endo/Heme/Allergies:       No bleeding disorders.   Blood pressure 184/91, pulse 91, temperature 98.2 F (36.8 C), temperature source Oral, resp. rate 18, height 5' 6"  (1.676 m), weight 77.1 kg (170 lb), SpO2 97 %. Physical Exam  Constitutional:  Overweight female in NAD  HENT:  Ng tube in.  Eyes: No scleral icterus.  Cardiovascular: Normal rate and regular rhythm.   Respiratory: Effort normal and breath sounds normal.  GI: She exhibits distension. She exhibits no mass. There is no tenderness.  Lower midline scar.  Hypoactive bowel sounds.  Genitourinary:  Genitourinary Comments: No palpable groin hernias.  Musculoskeletal:  No back tenderness.  Neurological: She is alert.  Skin: Skin is warm and dry. She is not diaphoretic.  Psychiatric: She has a normal mood and affect. Her behavior is normal.    Assessment/Plan: 1.  SBO-most likely from adhesions. 2.  Metastatic breast cancer  Rec:  Agree with ng decompression.  Will order small bowel obstruction x-ray protocol for tomorrow.  Renell Coaxum J 10/13/2016, 11:25 PM

## 2016-10-14 ENCOUNTER — Inpatient Hospital Stay (HOSPITAL_COMMUNITY): Payer: Medicare HMO

## 2016-10-14 LAB — CBC
HEMATOCRIT: 32.1 % — AB (ref 36.0–46.0)
Hemoglobin: 10.6 g/dL — ABNORMAL LOW (ref 12.0–15.0)
MCH: 27.7 pg (ref 26.0–34.0)
MCHC: 33 g/dL (ref 30.0–36.0)
MCV: 84 fL (ref 78.0–100.0)
PLATELETS: 376 10*3/uL (ref 150–400)
RBC: 3.82 MIL/uL — ABNORMAL LOW (ref 3.87–5.11)
RDW: 14.2 % (ref 11.5–15.5)
WBC: 7.6 10*3/uL (ref 4.0–10.5)

## 2016-10-14 LAB — MAGNESIUM: Magnesium: 2 mg/dL (ref 1.7–2.4)

## 2016-10-14 LAB — COMPREHENSIVE METABOLIC PANEL
ALBUMIN: 3.8 g/dL (ref 3.5–5.0)
ALT: 13 U/L — AB (ref 14–54)
AST: 21 U/L (ref 15–41)
Alkaline Phosphatase: 137 U/L — ABNORMAL HIGH (ref 38–126)
Anion gap: 8 (ref 5–15)
BUN: 18 mg/dL (ref 6–20)
CALCIUM: 8.4 mg/dL — AB (ref 8.9–10.3)
CO2: 25 mmol/L (ref 22–32)
CREATININE: 0.84 mg/dL (ref 0.44–1.00)
Chloride: 106 mmol/L (ref 101–111)
GFR calc Af Amer: >60 mL/min (ref >60)
GFR calc non Af Amer: >60 mL/min (ref >60)
Glucose, Bld: 114 mg/dL — ABNORMAL HIGH (ref 65–99)
POTASSIUM: 3.7 mmol/L (ref 3.5–5.1)
Sodium: 139 mmol/L (ref 135–145)
TOTAL PROTEIN: 7.3 g/dL (ref 6.5–8.1)
Total Bilirubin: 0.5 mg/dL (ref 0.3–1.2)

## 2016-10-14 LAB — TSH: TSH: 0.713 u[IU]/mL (ref 0.350–4.500)

## 2016-10-14 LAB — PHOSPHORUS: Phosphorus: 3.4 mg/dL (ref 2.5–4.6)

## 2016-10-14 MED ORDER — KCL IN DEXTROSE-NACL 10-5-0.45 MEQ/L-%-% IV SOLN
INTRAVENOUS | Status: DC
  Administered 2016-10-14 (×2): via INTRAVENOUS
  Filled 2016-10-14 (×3): qty 1000

## 2016-10-14 MED ORDER — LORAZEPAM 2 MG/ML IJ SOLN
1.0000 mg | Freq: Once | INTRAMUSCULAR | Status: AC
  Administered 2016-10-14: 1 mg via INTRAVENOUS
  Filled 2016-10-14: qty 1

## 2016-10-14 MED ORDER — DIATRIZOATE MEGLUMINE & SODIUM 66-10 % PO SOLN
90.0000 mL | Freq: Once | ORAL | Status: AC
  Administered 2016-10-14: 90 mL via NASOGASTRIC
  Filled 2016-10-14: qty 90

## 2016-10-14 MED ORDER — SODIUM CHLORIDE 0.9 % IV BOLUS (SEPSIS)
250.0000 mL | Freq: Once | INTRAVENOUS | Status: AC
Start: 2016-10-14 — End: 2016-10-14
  Administered 2016-10-14: 250 mL via INTRAVENOUS

## 2016-10-14 MED ORDER — MORPHINE SULFATE (PF) 2 MG/ML IV SOLN
2.0000 mg | Freq: Once | INTRAVENOUS | Status: AC
  Administered 2016-10-14: 2 mg via INTRAVENOUS
  Filled 2016-10-14: qty 1

## 2016-10-14 MED ORDER — HYDRALAZINE HCL 20 MG/ML IJ SOLN: 10.0000 mg | INTRAMUSCULAR | Status: DC | PRN

## 2016-10-14 NOTE — Progress Notes (Signed)
[facility] Progress Note     Subjective: Abdominal pain improving but still present, worse over upper abdomen. Feels minimally better with NGT. Denies nausea/vomiting. Denies flatus or BM.  Abdominal film shows NGT coiled at GE junction - nurse to reposition and advance ~6 cm today.  No output in NG cannister   Objective: Vital signs in last 24 hours: Temp:  [98.2 F (36.8 C)-98.4 F (36.9 C)] 98.4 F (36.9 C) (11/20 0419) Pulse Rate:  [88-96] 89 (11/20 0419) Resp:  [16-18] 16 (11/20 0419) BP: (143-210)/(67-91) 143/67 (11/20 0419) SpO2:  [97 %-100 %] 100 % (11/20 0419) Weight:  [170 lb (77.1 kg)] 170 lb (77.1 kg) (11/19 1755) Last BM Date: 10/13/16  Intake/Output from previous day: 11/19 0701 - 11/20 0700 In: 593.3 [I.V.:593.3] Out: -   PE: Gen:  Alert, NAD, pleasant Abd: Soft, TTP upper abdomen, mild distention, hypoactive BS, no peritonitis   Lab Results:   Recent Labs  10/13/16 1800 10/14/16 0405  WBC 10.7* 7.6  HGB 11.5* 10.6*  HCT 35.6* 32.1*  PLT 405* 376   BMET  Recent Labs  10/13/16 1800 10/14/16 0405  NA 138 139  K 3.8 3.7  CL 103 106  CO2 26 25  GLUCOSE 144* 114*  BUN 14 18  CREATININE 0.83 0.84  CALCIUM 9.2 8.4*   CMP     Component Value Date/Time   NA 139 10/14/2016 0405   NA 142 08/14/2016 0925   K 3.7 10/14/2016 0405   K 4.3 08/14/2016 0925   CL 106 10/14/2016 0405   CO2 25 10/14/2016 0405   CO2 28 08/14/2016 0925   GLUCOSE 114 (H) 10/14/2016 0405   GLUCOSE 109 08/14/2016 0925   BUN 18 10/14/2016 0405   BUN 12.9 08/14/2016 0925   CREATININE 0.84 10/14/2016 0405   CREATININE 1.0 08/14/2016 0925   CALCIUM 8.4 (L) 10/14/2016 0405   CALCIUM 9.5 08/14/2016 0925   PROT 7.3 10/14/2016 0405   PROT 7.3 08/14/2016 0925   ALBUMIN 3.8 10/14/2016 0405   ALBUMIN 3.5 08/14/2016 0925   AST 21 10/14/2016 0405   AST 19 08/14/2016 0925   ALT 13 (L) 10/14/2016 0405   ALT 13 08/14/2016 0925   ALKPHOS 137 (H) 10/14/2016 0405    ALKPHOS 211 (H) 08/14/2016 0925   BILITOT 0.5 10/14/2016 0405   BILITOT 0.34 08/14/2016 0925   GFRNONAA >60 10/14/2016 0405   GFRAA >60 10/14/2016 0405   Lipase     Component Value Date/Time   LIPASE 20 10/13/2016 1800   Studies/Results: Ct Abdomen Pelvis W Contrast  Result Date: 10/13/2016 CLINICAL DATA:  Abdominal pain with nausea and vomiting. History of breast carcinoma EXAM: CT ABDOMEN AND PELVIS WITH CONTRAST TECHNIQUE: Multidetector CT imaging of the abdomen and pelvis was performed using the standard protocol following bolus administration of intravenous contrast. CONTRAST:  166mL ISOVUE-300 IOPAMIDOL (ISOVUE-300) INJECTION 61% COMPARISON:  None. FINDINGS: Lower chest: Lung bases are clear. There is a fairly small focal hiatal hernia. Hepatobiliary: Liver measures 19.0 cm in length. There is hepatic steatosis. No focal liver lesions are evident. Gallbladder is mildly distended without appreciable wall thickening. There is no biliary duct dilatation. Pancreas: There is fatty replacement within the pancreas. There is no pancreatic mass or inflammatory focus. Spleen: No splenic lesions are evident. Adrenals/Urinary Tract: Adrenals appear normal bilaterally. There is a cyst in the lower pole of the right kidney measuring 2.0 x 1.5 cm. There is no evident hydronephrosis on either side. There is no  renal or ureteral calculus on either side. Urinary bladder is midline with wall thickness within normal limits. Stomach/Bowel: There is bowel dilatation throughout much of the jejunum. There is a transition zone in the mid abdomen just to the left of midline in the distal jejunal region consistent with a degree of bowel obstruction. There is no appreciable free air or portal venous air. Most of the dilated loops of bowel containing fluid. There is no mesenteric or bowel wall thickening. Vascular/Lymphatic: There is atherosclerotic calcification in the aorta and proximal common iliac arteries, slightly  more on the right than on the left. There is no abdominal aortic aneurysm. The major mesenteric vessels appear patent. There is no appreciable adenopathy in the abdomen or pelvis. Reproductive: Uterus is absent. There is a small amount of free fluid in the dependent portion of the pelvis. No pelvic mass evident. Other: Appendix appears normal. No abscess in the abdomen or pelvis. There is a minimal ventral hernia containing only fat. Musculoskeletal: Patient is status post total hip replacement on the left. There is degenerative change in the lumbar spine. There is lytic change in the left twelfth rib, concerning for metastatic disease. A similar appearing lytic lesion is also noted in posterior right ninth rib. IMPRESSION: Evidence of small bowel obstruction with transition zone just to the left of midline in the distal jejunal region. No free air. No abscess. Appendix appears normal. 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 Patient is status post total hip replacement on the left. It may be prudent to correlate with nuclear medicine bone scan to assess for other potential sites of bony metastatic disease. Fairly small hiatal hernia present.  Rather minimal ventral hernia. Prominent liver with hepatic steatosis. No focal liver lesions evident. Gallbladder borderline distended without wall thickening. No renal or ureteral calculus.  No hydronephrosis. Small amount of free fluid in the dependent portion of pelvis. Etiology uncertain. This finding may be due to reaction from small bowel obstruction. Uterus absent. Electronically Signed   By: Lowella Grip III M.D.   On: 10/13/2016 20:05   Dg Abd Portable 1v-small Bowel Protocol-position Verification  Result Date: 10/14/2016 CLINICAL DATA:  NG tube. EXAM: PORTABLE ABDOMEN - 1 VIEW COMPARISON:  10/13/2016 . FINDINGS: NG tube noted in unchanged position coiled at the gastroesophageal junction. A loop of slightly distended small bowel  noted. Colonic gas pattern is unremarkable. No free air. Contrast is noted in the right renal collecting system and bladder from prior CT. Left hip replacement. IMPRESSION: 1. NG tube in unchanged position coiled at the gastroesophageal junction. 2. Single slightly prominent loop of small bowel. Follow-up exam suggested to demonstrate resolution . Colonic gas pattern unremarkable. No free air. Electronically Signed   By: Marcello Moores  Register   On: 10/14/2016 08:38   Dg Abd Portable 1v  Result Date: 10/13/2016 CLINICAL DATA:  NG tube placement EXAM: PORTABLE ABDOMEN - 1 VIEW COMPARISON:  CT abdomen pelvis 10/13/2016 FINDINGS: NG tube is coiled in the region of the gastroesophageal junction. The tip and side port are below the diaphragm. There is a normal bowel gas pattern. Excreted IV contrast is seen within the renal calices. IMPRESSION: NG tube coiled in the region of the gastroesophageal junction. Tip and side port are below the diaphragm. Electronically Signed   By: Ulyses Jarred M.D.   On: 10/13/2016 22:43   Anti-infectives: Anti-infectives    None     Assessment/Plan Small Bowel Obstruction, recurrent  Likely secondary to adhesions,  PMH partial hysterectomy     Previously resolved with non-op mgmt   Advance NGT and confirm placement with abdominal film  Small Bowel protocol - follow abdominal films s/p gastrografin  NGT to LIWS  NPO, IVF   Metastatic breast CA  Dispo: advance NGT, SB protocol   LOS: 1 day    [facility] 10/14/2016, 8:48 AM Pager: [phone] Consults: [phone] Mon-Fri 7:00 am-4:30 pm Sat-Sun 7:00 am-11:30 am

## 2016-10-14 NOTE — Plan of Care (Signed)
Problem: Nutrition: Goal: Adequate nutrition will be maintained Outcome: Not Progressing NG tube, NPO  Problem: Bowel/Gastric: Goal: Will not experience complications related to bowel motility Outcome: Not Progressing SBO

## 2016-10-14 NOTE — Progress Notes (Signed)
PROGRESS NOTE  Carla Roth  HEN:[id number] DOB: 1941-01-04 DOA: 10/13/2016 PCP: No PCP Per Patient  Brief Narrative:   Carla Roth is a 75 y.o. female with medical history significant of ER/PR positive HER-2 negative metastatic breast cancer, pathologic left hip fracture, hip replacement 2015, hysterectomy, and recurrent SBO.  Presented with sudden onset of abdominal distention and pain with associated nausea and vomiting and no diarrhea. Patient has history of small bowel obstructions which have all resolved with conservative management.  She was started on letrozole after completion of palliative radiation therapy. 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  Assessment & Plan:   Active Problems:   Metastatic breast cancer (HCC)   Small bowel obstruction   Accelerated hypertension  SBO likely due to adhesions -  Continue NGT > minimal output due to malpositioning -  Repositioned NGT and continue LIS -  Plan for UGI with SBFT -  Continue NPO with IVF -  Change fluid to include dextrose   -  Encouraged ambulation  Metastatic breast cancer -  Hold letrozole until tolerating PO  Hypertension, elevated blood pressures due to nausea and pain -  Change labetalol to IV hydralazine for longer half-life    DVT prophylaxis:  lovenox Code Status:  Full code Family Communication:  Patient alone Disposition Plan:  Pending tolerating PO   Consultants:   General surgery  Procedures:  none  Antimicrobials:   none    Subjective:  Slightly improved abdominal pain.  No flatus or BM since yesterday.    Objective: Vitals:   10/14/16 0007 10/14/16 0419 10/14/16 1034 10/14/16 1347  BP: (!) 155/75 (!) 143/67 (!) 150/67 (!) 148/70  Pulse: 96 89 82 79  Resp: _0 Temp: 98.3 F (36.8 C) 98.4 F (36.9 C) 98.5 F (36.9 C) 98.3 F (36.8 C)  TempSrc:  Oral Oral Oral Oral  SpO2: 100% 100% 100% 100%  Weight:      Height:        Intake/Output Summary (Last 24 hours) at 10/14/16 1631 Last data filed at 10/14/16 1347  Gross per 24 hour  Intake           593.33 ml  Output                0 ml  Net           593.33 ml   Filed Weights   10/13/16 1755  Weight: 77.1 kg (170 lb)    Examination:  General exam:  Adult female.  No acute distress.  HEENT:  NCAT, MMM Respiratory system: Clear to auscultation bilaterally Cardiovascular system: Regular rate and rhythm, normal S1/S2. No murmurs, rubs, gallops or clicks.  Warm extremities Gastrointestinal system: normal active bowel sounds, soft, moderately distended.  TTP in the epigastrium, periumbilical area and just to the right without rebound or guarding.  Palpable distended loops of bowel.   MSK:  Normal tone and bulk, no lower extremity edema Neuro:  Grossly intact    Data Reviewed: I have personally reviewed following labs and imaging studies  CBC:  Recent Labs Lab 10/13/16 1800 10/14/16 0405  WBC 10.7* 7.6  HGB 11.5* 10.6*  HCT 35.6* 32.1*  MCV 84.4 84.0  PLT 405* 361   Basic Metabolic Panel:  Recent Labs Lab 10/13/16 1800 10/14/16 0405  NA 138 139  K 3.8 3.7  CL 103 106  CO2 26 25  GLUCOSE 144* 114*  BUN 14 18  CREATININE 0.83 0.84  CALCIUM 9.2 8.4*  MG  --  2.0  PHOS  --  3.4   GFR: Estimated Creatinine Clearance: 62.5 mL/min (by C-G formula based on SCr of 0.84 mg/dL). Liver Function Tests:  Recent Labs Lab 10/13/16 1800 10/14/16 0405  AST 22 21  ALT 15 13*  ALKPHOS 160* 137*  BILITOT 0.8 0.5  PROT 8.4* 7.3  ALBUMIN 4.6 3.8    Recent Labs Lab 10/13/16 1800  LIPASE 20   No results for input(s): AMMONIA in the last 168 hours. Coagulation Profile: No results for input(s): INR, PROTIME in the last 168 hours. Cardiac Enzymes: No results for input(s): CKTOTAL, CKMB, CKMBINDEX, TROPONINI in the last 168 hours. BNP (last 3 results) No results  for input(s): PROBNP in the last 8760 hours. HbA1C: No results for input(s): HGBA1C in the last 72 hours. CBG: No results for input(s): GLUCAP in the last 168 hours. Lipid Profile: No results for input(s): CHOL, HDL, LDLCALC, TRIG, CHOLHDL, LDLDIRECT in the last 72 hours. Thyroid Function Tests:  Recent Labs  10/14/16 0405  TSH 0.713   Anemia Panel: No results for input(s): VITAMINB12, FOLATE, FERRITIN, TIBC, IRON, RETICCTPCT in the last 72 hours. Urine analysis: No results found for: COLORURINE, APPEARANCEUR, LABSPEC, PHURINE, GLUCOSEU, HGBUR, BILIRUBINUR, KETONESUR, PROTEINUR, UROBILINOGEN, NITRITE, LEUKOCYTESUR Sepsis Labs: _0 (procalcitonin:4,lacticidven:4)  )No results found for this or any previous visit (from the past 240 hour(s)).    Radiology Studies: Ct Abdomen Pelvis W Contrast  Result Date: 10/13/2016 CLINICAL DATA:  Abdominal pain with nausea and vomiting. History of breast carcinoma EXAM: CT ABDOMEN AND PELVIS WITH CONTRAST TECHNIQUE: Multidetector CT imaging of the abdomen and pelvis was performed using the standard protocol following bolus administration of intravenous contrast. CONTRAST:  114m ISOVUE-300 IOPAMIDOL (ISOVUE-300) INJECTION 61% COMPARISON:  None. FINDINGS: Lower chest: Lung bases are clear. There is a fairly small focal hiatal hernia. Hepatobiliary: Liver measures 19.0 cm in length. There is hepatic steatosis. No focal liver lesions are evident. Gallbladder is mildly distended without appreciable wall thickening. There is no biliary duct dilatation. Pancreas: There is fatty replacement within the pancreas. There is no pancreatic mass or inflammatory focus. Spleen: No splenic lesions are evident. Adrenals/Urinary Tract: Adrenals appear normal bilaterally. There is a cyst in the lower pole of the right kidney measuring 2.0 x 1.5 cm. There is no evident hydronephrosis on either side. There is no renal or ureteral calculus on either side. Urinary bladder  is midline with wall thickness within normal limits. Stomach/Bowel: There is bowel dilatation throughout much of the jejunum. There is a transition zone in the mid abdomen just to the left of midline in the distal jejunal region consistent with a degree of bowel obstruction. There is no appreciable free air or portal venous air. Most of the dilated loops of bowel containing fluid. There is no mesenteric or bowel wall thickening. Vascular/Lymphatic: There is atherosclerotic calcification in the aorta and proximal common iliac arteries, slightly more on the right than on the left. There is no abdominal aortic aneurysm. The major mesenteric vessels appear patent. There is no appreciable adenopathy in the abdomen or pelvis. Reproductive: Uterus is absent. There is a small amount of free fluid in the dependent portion of the pelvis. No pelvic mass evident. Other: Appendix appears normal. No abscess in the abdomen or pelvis. There is a minimal ventral hernia containing only fat. Musculoskeletal: Patient is status  post total hip replacement on the left. There is degenerative change in the lumbar spine. There is lytic change in the left twelfth rib, concerning for metastatic disease. A similar appearing lytic lesion is also noted in posterior right ninth rib. IMPRESSION: Evidence of small bowel obstruction with transition zone just to the left of midline in the distal jejunal region. No free air. No abscess. Appendix appears normal. 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 Patient is status post total hip replacement on the left. It may be prudent to correlate with nuclear medicine bone scan to assess for other potential sites of bony metastatic disease. Fairly small hiatal hernia present.  Rather minimal ventral hernia. Prominent liver with hepatic steatosis. No focal liver lesions evident. Gallbladder borderline distended without wall thickening. No renal or ureteral calculus.  No  hydronephrosis. Small amount of free fluid in the dependent portion of pelvis. Etiology uncertain. This finding may be due to reaction from small bowel obstruction. Uterus absent. Electronically Signed   By: Lowella Grip III M.D.   On: 10/13/2016 20:05   Dg Abd Portable 1v  Result Date: 10/14/2016 CLINICAL DATA:  Advanced nasogastric tube. EXAM: PORTABLE ABDOMEN - 1 VIEW COMPARISON:  Earlier today FINDINGS: Advanced nasogastric tube with tip and side port below the diaphragm. Unchanged gaseous distension of small bowel. No concerning mass effect or gas collection. Iodinated contrast still visualized in the urinary bladder. IMPRESSION: 1. Advanced nasogastric tube with tip and side port below the diaphragm. 2. Unchanged small bowel distention. Electronically Signed   By: Monte Fantasia M.D.   On: 10/14/2016 10:20   Dg Abd Portable 1v-small Bowel Protocol-position Verification  Result Date: 10/14/2016 CLINICAL DATA:  NG tube. EXAM: PORTABLE ABDOMEN - 1 VIEW COMPARISON:  10/13/2016 . FINDINGS: NG tube noted in unchanged position coiled at the gastroesophageal junction. A loop of slightly distended small bowel noted. Colonic gas pattern is unremarkable. No free air. Contrast is noted in the right renal collecting system and bladder from prior CT. Left hip replacement. IMPRESSION: 1. NG tube in unchanged position coiled at the gastroesophageal junction. 2. Single slightly prominent loop of small bowel. Follow-up exam suggested to demonstrate resolution . Colonic gas pattern unremarkable. No free air. Electronically Signed   By: Marcello Moores  Register   On: 10/14/2016 08:38   Dg Abd Portable 1v  Result Date: 10/13/2016 CLINICAL DATA:  NG tube placement EXAM: PORTABLE ABDOMEN - 1 VIEW COMPARISON:  CT abdomen pelvis 10/13/2016 FINDINGS: NG tube is coiled in the region of the gastroesophageal junction. The tip and side port are below the diaphragm. There is a normal bowel gas pattern. Excreted IV contrast is  seen within the renal calices. IMPRESSION: NG tube coiled in the region of the gastroesophageal junction. Tip and side port are below the diaphragm. Electronically Signed   By: Ulyses Jarred M.D.   On: 10/13/2016 22:43     Scheduled Meds: . enoxaparin (LOVENOX) injection  40 mg Subcutaneous Q24H   Continuous Infusions: . dextrose 5 % and 0.45 % NaCl with KCl 10 mEq/L 100 mL/hr at 10/14/16 1015     LOS: 1 day    Time spent: 30 min    Janece Canterbury, MD [facility] Pager [phone]  If 7PM-7AM, please contact night-coverage [email or web address] Password [email or web address] 10/14/2016, 4:31 PM

## 2016-10-15 MED ORDER — POLYETHYLENE GLYCOL 3350 17 G PO PACK: 17.0000 g | PACK | Freq: Every day | ORAL | 0 refills | Status: AC | PRN

## 2016-10-15 MED ORDER — POLYETHYLENE GLYCOL 3350 17 G PO PACK: 17.0000 g | PACK | Freq: Every day | ORAL | Status: DC | PRN

## 2016-10-15 NOTE — Progress Notes (Signed)
Pt continues to ask when she is going to be discharged. I have explained to her that [facility] MD told her this am that is tolerated a diet that they may DC her this evening. Will continue to monitor.

## 2016-10-15 NOTE — Discharge Instructions (Signed)
Low-Fiber Diet °Fiber is found in fruits, vegetables, and whole grains. A low-fiber diet restricts fibrous foods that are not digested in the small intestine. A diet containing about 10-15 grams of fiber per day is considered low fiber. Low-fiber diets may be used to: °· Promote healing and rest the bowel during intestinal flare-ups. °· Prevent blockage of a partially obstructed or narrowed gastrointestinal tract. °· Reduce fecal weight and volume. °· Slow the movement of feces. ° °You may be on a low-fiber diet as a transitional diet following surgery, after an injury (trauma), or because of a short (acute) or lifelong (chronic) illness. Your health care provider will determine the length of time you need to stay on this diet. °What do I need to know about a low-fiber diet? °Always check the fiber content on the packaging's Nutrition Facts label, especially on foods from the grains list. Ask your dietitian if you have questions about specific foods that are related to your condition, especially if the food is not listed below. In general, a low-fiber food will have less than 2 g of fiber. °What foods can I eat? °Grains °All breads and crackers made with white flour. Sweet rolls, doughnuts, waffles, pancakes, French toast, bagels. Pretzels, Melba toast, zwieback. Well-cooked cereals, such as cornmeal, farina, or cream cereals. Dry cereals that do not contain whole grains, fruit, or nuts, such as refined corn, wheat, rice, and oat cereals. Potatoes prepared any way without skins, plain pastas and noodles, refined white rice. Use white flour for baking and making sauces. Use allowed list of grains for casseroles, dumplings, and puddings. °Vegetables °Strained tomato and vegetable juices. Fresh lettuce, cucumber, spinach. Well-cooked (no skin or pulp) or canned vegetables, such as asparagus, bean sprouts, beets, carrots, green beans, mushrooms, potatoes, pumpkin, spinach, yellow squash, tomato sauce/puree, turnips,  yams, and zucchini. Keep servings limited to ½ cup. °Fruits °All fruit juices except prune juice. Cooked or canned fruits without skin and seeds, such as applesauce, apricots, cherries, fruit cocktail, grapefruit, grapes, mandarin oranges, melons, peaches, pears, pineapple, and plums. Fresh fruits without skin, such as apricots, avocados, bananas, melons, pineapple, nectarines, and peaches. Keep servings limited to ½ cup or 1 piece. °Meat and Other Protein Sources °Ground or well-cooked tender beef, ham, veal, lamb, pork, or poultry. Eggs, plain cheese. Fish, oysters, shrimp, lobster, and other seafood. Liver, organ meats. Smooth nut butters. °Dairy °All milk products and alternative dairy substitutes, such as soy, rice, almond, and coconut, not containing added whole nuts, seeds, or added fruit. °Beverages °Decaf coffee, fruit, and vegetable juices or smoothies (small amounts, with no pulp or skins, and with fruits from allowed list), sports drinks, herbal tea. °Condiments °Ketchup, mustard, vinegar, cream sauce, cheese sauce, cocoa powder. Spices in moderation, such as allspice, basil, bay leaves, celery powder or leaves, cinnamon, cumin powder, curry powder, ginger, mace, marjoram, onion or garlic powder, oregano, paprika, parsley flakes, ground pepper, rosemary, sage, savory, tarragon, thyme, and turmeric. °Sweets and Desserts °Plain cakes and cookies, pie made with allowed fruit, pudding, custard, cream pie. Gelatin, fruit, ice, sherbet, frozen ice pops. Ice cream, ice milk without nuts. Plain hard candy, honey, jelly, molasses, syrup, sugar, chocolate syrup, gumdrops, marshmallows. Limit overall sugar intake. °Fats and Oil °Margarine, butter, cream, mayonnaise, salad oils, plain salad dressings made from allowed foods. Choose healthy fats such as olive oil, canola oil, and omega-3 fatty acids (such as found in salmon or tuna) when possible. °Other °Bouillon, broth, or cream soups made from allowed foods. Any    strained soup. Casseroles or mixed dishes made with allowed foods. °The items listed above may not be a complete list of recommended foods or beverages. Contact your dietitian for more options. °What foods are not recommended? °Grains °All whole wheat and whole grain breads and crackers. Multigrains, rye, bran seeds, nuts, or coconut. Cereals containing whole grains, multigrains, bran, coconut, nuts, raisins. Cooked or dry oatmeal, steel-cut oats. Coarse wheat cereals, granola. Cereals advertised as high fiber. Potato skins. Whole grain pasta, wild or brown rice. Popcorn. Coconut flour. Bran, buckwheat, corn bread, multigrains, rye, wheat germ. °Vegetables °Fresh, cooked or canned vegetables, such as artichokes, asparagus, beet greens, broccoli, Brussels sprouts, cabbage, celery, cauliflower, corn, eggplant, kale, legumes or beans, okra, peas, and tomatoes. Avoid large servings of any vegetables, especially raw vegetables. °Fruits °Fresh fruits, such as apples with or without skin, berries, cherries, figs, grapes, grapefruit, guavas, kiwis, mangoes, oranges, papayas, pears, persimmons, pineapple, and pomegranate. Prune juice and juices with pulp, stewed or dried prunes. Dried fruits, dates, raisins. Fruit seeds or skins. Avoid large servings of all fresh fruits. °Meats and Other Protein Sources °Tough, fibrous meats with gristle. Chunky nut butter. Cheese made with seeds, nuts, or other foods not recommended. Nuts, seeds, legumes (beans, including baked beans), dried peas, beans, lentils. °Dairy °Yogurt or cheese that contains nuts, seeds, or added fruit. °Beverages °Fruit juices with high pulp, prune juice. Caffeinated coffee and teas. °Condiments °Coconut, maple syrup, pickles, olives. °Sweets and Desserts °Desserts, cookies, or candies that contain nuts or coconut, chunky peanut butter, dried fruits. Jams, preserves with seeds, marmalade. Large amounts of sugar and sweets. Any other dessert made with fruits from  the not recommended list. °Other °Soups made from vegetables that are not recommended or that contain other foods not recommended. °The items listed above may not be a complete list of foods and beverages to avoid. Contact your dietitian for more information. °This information is not intended to replace advice given to you by your health care provider. Make sure you discuss any questions you have with your health care provider. °Document Released: 05/03/2002 Document Revised: 04/18/2016 Document Reviewed: 10/04/2013 °[employer]vier Interactive Patient Education © 2017 [employer] ° °

## 2016-10-15 NOTE — Progress Notes (Signed)
Paged Dr. Hilbert Bible for something to help the patient at patient request. Received and order for Ativan IV.

## 2016-10-15 NOTE — Progress Notes (Signed)
[facility] returned paged and gave the ok for DC as long as the attending hospitalist MD was ok with DC. I have paged the attending. Will continue to monitor.

## 2016-10-15 NOTE — Progress Notes (Signed)
Paged Dr. Hilbert Bible about patient BP and received an order for a bolus of 244mL. Will cont to monitor patient.

## 2016-10-15 NOTE — Discharge Summary (Addendum)
Physician Discharge Summary  Carla Roth TOI:[id number] DOB: 12-10-1940 DOA: 10/13/2016  PCP: No PCP Per Patient  Admit date: 10/13/2016 Discharge date: 10/15/2016  Admitted From: home  Disposition:  home  Recommendations for Outpatient Follow-up:  1. Follow up with Oncologist Dr. 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  She presented with sudden onset of abdominal distention and pain with associated nausea and vomiting but no diarrhea, similar to her prior SBO.  CT confirmed a small bowel obstruction with transition zone just to the left of midline in the distal jejunal region.  NG was placed to low intermittent suction and she was given IVF, antiemetics, and pain medication.  She had relief from her symptoms within 24 hours and had bowel movements, resolution of abdominal pain, and no nausea after eating solid foods.    Discharge Diagnoses:  Principal Problem:   Small bowel obstruction Active Problems:   Metastatic breast cancer ([place])   Accelerated hypertension  SBO likely due to adhesions, resolved with conservative measures -  UGI with SBFT demonstrated resolution of obstruction on 11/20 -  NG removed and patient tolerated soft diet on 11/21  Metastatic breast cancer -  Hold letrozole until tolerating PO, resumed at discharge  Hypertension, elevated blood pressures due to nausea and pain and resolved with treatment of underlying symptoms.  Lisinopril discontinued while NPO and NOT resumed at discharge due to low normal blood pressures.     Discharge Instructions  Discharge Instructions    Call MD for:  difficulty breathing, headache or visual disturbances    Complete by:  As directed    Call MD for:  extreme fatigue    Complete by:  As directed    Call MD for:  hives    Complete by:  As directed    Call MD for:  persistant dizziness or light-headedness    Complete by:  As directed    Call MD for:  persistant nausea and vomiting    Complete by:  As directed    Call MD for:  severe uncontrolled pain    Complete by:  As directed    Call MD for:  temperature >100.4    Complete by:  As directed    Diet general    Complete by:  As directed    Increase activity slowly    Complete by:  As directed        Medication List    STOP taking these medications   lisinopril 2.5 MG tablet Commonly known as:  PRINIVIL,ZESTRIL     TAKE these medications   furosemide 20 MG tablet Commonly known as:  LASIX Take 1 tablet (20 mg total) by mouth daily. What changed:  when to take this  reasons to take this   letrozole 2.5 MG tablet Commonly known as:  FEMARA 2.5 mg once daily   multivitamin with minerals Tabs tablet Take 2 tablets by mouth daily.   naproxen sodium 220 MG tablet Commonly known as:  ANAPROX Take 440 mg by mouth 2 (two) times daily with a meal.   polyethylene glycol packet Commonly known as:  MIRALAX /  GLYCOLAX Take 17 g by mouth daily as needed for moderate constipation.   zolpidem 5 MG tablet Commonly known as:  AMBIEN 46m at bedtime for sleep      Follow-up Information    GRulon Eisenmenger MD Follow up.   Specialty:  Hematology and Oncology Why:  as needed Contact information: [phone]         No Known Allergies  Consultations: [facility], Dr. NLucia Gaskins   Procedures/Studies: Ct Abdomen Pelvis W Contrast  Result Date: 10/13/2016 CLINICAL DATA:  Abdominal pain with nausea and vomiting. History of breast carcinoma  EXAM: CT ABDOMEN AND PELVIS WITH CONTRAST TECHNIQUE: Multidetector CT imaging of the abdomen and pelvis was performed using the standard protocol following bolus administration of intravenous contrast. CONTRAST:  1049mISOVUE-300 IOPAMIDOL (ISOVUE-300) INJECTION 61% COMPARISON:  None. FINDINGS: Lower chest: Lung bases are clear. There is a fairly small focal hiatal hernia. Hepatobiliary: Liver measures 19.0 cm in length. There is hepatic steatosis. No focal liver lesions are evident. Gallbladder is mildly distended without appreciable wall thickening. There is no biliary duct dilatation. Pancreas: There is fatty replacement within the pancreas. There is no pancreatic mass or inflammatory focus. Spleen: No splenic lesions are evident. Adrenals/Urinary Tract: Adrenals appear normal bilaterally. There is a cyst in the lower pole of the right kidney measuring 2.0 x 1.5 cm. There is no evident hydronephrosis on either side. There is no renal or ureteral calculus on either side. Urinary bladder is midline with wall thickness within normal limits. Stomach/Bowel: There is bowel dilatation throughout much of the jejunum. There is a transition zone in the mid abdomen just to the left of midline in the distal jejunal region consistent with a degree of bowel obstruction. There is no appreciable free air or portal venous air. Most of the dilated loops of bowel containing fluid. There is no mesenteric or bowel wall thickening. Vascular/Lymphatic: There is atherosclerotic calcification in the aorta and proximal common iliac arteries, slightly more on the right than on the left. There is no abdominal aortic aneurysm. The major mesenteric vessels appear patent. There is no appreciable adenopathy in the abdomen or pelvis. Reproductive: Uterus is absent. There is a small amount of free fluid in the dependent portion of the pelvis. No pelvic mass evident. Other: Appendix appears normal. No abscess in the abdomen or pelvis. There is a  minimal ventral hernia containing only fat. Musculoskeletal: Patient is status post total hip replacement on the left. There is degenerative change in the lumbar spine. There is lytic change in the left twelfth rib, concerning for metastatic disease. A similar appearing lytic lesion is also noted in posterior right ninth rib. IMPRESSION: Evidence of small bowel obstruction with transition zone just to the left of midline in the distal jejunal region. No free air. No abscess. Appendix appears normal. 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 Patient is status post total hip replacement on the left. It may be prudent to correlate with nuclear medicine bone scan to assess for other potential sites of bony metastatic disease. Fairly small hiatal hernia present.  Rather minimal ventral hernia. Prominent liver with hepatic steatosis. No focal liver lesions evident. Gallbladder borderline distended without wall thickening. No renal or ureteral calculus.  No hydronephrosis. Small amount of free fluid in the dependent portion of pelvis. Etiology uncertain. This finding may be due to reaction from small bowel obstruction. Uterus absent. Electronically Signed  By: Lowella Grip III M.D.   On: 10/13/2016 20:05   Dg Abd Portable 1v-small Bowel Obstruction Protocol-initial, 8 Hr Delay  Result Date: 10/14/2016 CLINICAL DATA:  Small bowel obstruction. EXAM: PORTABLE ABDOMEN - 1 VIEW COMPARISON:  Radiograph of same day. FINDINGS: Distal tip of nasogastric tube is unchanged in position and looped within proximal stomach. No significant colonic dilatation is noted. No significant bowel dilatation is noted. Small phleboliths are noted in the pelvis. Status post left hip arthroplasty. IMPRESSION: No definite evidence of bowel obstruction or ileus. Electronically Signed   By: Marijo Conception, M.D.   On: 10/14/2016 21:47   Dg Abd Portable 1v  Result Date: 10/14/2016 CLINICAL DATA:  Advanced  nasogastric tube. EXAM: PORTABLE ABDOMEN - 1 VIEW COMPARISON:  Earlier today FINDINGS: Advanced nasogastric tube with tip and side port below the diaphragm. Unchanged gaseous distension of small bowel. No concerning mass effect or gas collection. Iodinated contrast still visualized in the urinary bladder. IMPRESSION: 1. Advanced nasogastric tube with tip and side port below the diaphragm. 2. Unchanged small bowel distention. Electronically Signed   By: Monte Fantasia M.D.   On: 10/14/2016 10:20   Dg Abd Portable 1v-small Bowel Protocol-position Verification  Result Date: 10/14/2016 CLINICAL DATA:  NG tube. EXAM: PORTABLE ABDOMEN - 1 VIEW COMPARISON:  10/13/2016 . FINDINGS: NG tube noted in unchanged position coiled at the gastroesophageal junction. A loop of slightly distended small bowel noted. Colonic gas pattern is unremarkable. No free air. Contrast is noted in the right renal collecting system and bladder from prior CT. Left hip replacement. IMPRESSION: 1. NG tube in unchanged position coiled at the gastroesophageal junction. 2. Single slightly prominent loop of small bowel. Follow-up exam suggested to demonstrate resolution . Colonic gas pattern unremarkable. No free air. Electronically Signed   By: Marcello Moores  Register   On: 10/14/2016 08:38   Dg Abd Portable 1v  Result Date: 10/13/2016 CLINICAL DATA:  NG tube placement EXAM: PORTABLE ABDOMEN - 1 VIEW COMPARISON:  CT abdomen pelvis 10/13/2016 FINDINGS: NG tube is coiled in the region of the gastroesophageal junction. The tip and side port are below the diaphragm. There is a normal bowel gas pattern. Excreted IV contrast is seen within the renal calices. IMPRESSION: NG tube coiled in the region of the gastroesophageal junction. Tip and side port are below the diaphragm. Electronically Signed   By: Ulyses Jarred M.D.   On: 10/13/2016 22:43    Subjective: Feels back to normal and would like to go home.  Wants to have her NG removed and go home.  Had  bowel movements overnight.  No abdominal pain or nausea.    Discharge Exam: Vitals:   10/15/16 1113 10/15/16 1430  BP: 108/66 124/73  Pulse: 79 91  Resp: 18 18  Temp: 97.8 F (36.6 C) 98.4 F (36.9 C)   Vitals:   10/15/16 0111 10/15/16 0551 10/15/16 1113 10/15/16 1430  BP: (!) 120/58 (!) 115/55 108/66 124/73  Pulse: 84 87 79 91  Resp:  16 18 18   Temp:  98.3 F (36.8 C) 97.8 F (36.6 C) 98.4 F (36.9 C)  TempSrc:  Oral Oral Oral  SpO2:  97% 95% 95%  Weight:      Height:        General exam:  Adult female.  No acute distress.  HEENT:  NCAT, MMM Respiratory system: Clear to auscultation bilaterally Cardiovascular system: Regular rate and rhythm, normal S1/S2. No murmurs, rubs, gallops or clicks.  Warm extremities  Gastrointestinal system: normal active bowel sounds, soft, nondistended, nontender MSK:  Normal tone and bulk, no lower extremity edema Neuro:  Grossly intact    The results of significant diagnostics from this hospitalization (including imaging, microbiology, ancillary and laboratory) are listed below for reference.     Microbiology: No results found for this or any previous visit (from the past 240 hour(s)).   Labs: BNP (last 3 results) No results for input(s): BNP in the last 8760 hours. Basic Metabolic Panel:  Recent Labs Lab 10/13/16 1800 10/14/16 0405  NA 138 139  K 3.8 3.7  CL 103 106  CO2 26 25  GLUCOSE 144* 114*  BUN 14 18  CREATININE 0.83 0.84  CALCIUM 9.2 8.4*  MG  --  2.0  PHOS  --  3.4   Liver Function Tests:  Recent Labs Lab 10/13/16 1800 10/14/16 0405  AST 22 21  ALT 15 13*  ALKPHOS 160* 137*  BILITOT 0.8 0.5  PROT 8.4* 7.3  ALBUMIN 4.6 3.8    Recent Labs Lab 10/13/16 1800  LIPASE 20   No results for input(s): AMMONIA in the last 168 hours. CBC:  Recent Labs Lab 10/13/16 1800 10/14/16 0405  WBC 10.7* 7.6  HGB 11.5* 10.6*  HCT 35.6* 32.1*  MCV 84.4 84.0  PLT 405* 376   Cardiac Enzymes: No results for  input(s): CKTOTAL, CKMB, CKMBINDEX, TROPONINI in the last 168 hours. BNP: Invalid input(s): POCBNP CBG: No results for input(s): GLUCAP in the last 168 hours. D-Dimer No results for input(s): DDIMER in the last 72 hours. Hgb A1c No results for input(s): HGBA1C in the last 72 hours. Lipid Profile No results for input(s): CHOL, HDL, LDLCALC, TRIG, CHOLHDL, LDLDIRECT in the last 72 hours. Thyroid function studies  Recent Labs  10/14/16 0405  TSH 0.713   Anemia work up No results for input(s): VITAMINB12, FOLATE, FERRITIN, TIBC, IRON, RETICCTPCT in the last 72 hours. Urinalysis No results found for: COLORURINE, APPEARANCEUR, LABSPEC, [place], GLUCOSEU, HGBUR, BILIRUBINUR, KETONESUR, PROTEINUR, UROBILINOGEN, NITRITE, LEUKOCYTESUR Sepsis Labs Invalid input(s): PROCALCITONIN,  WBC,  LACTICIDVEN   Time coordinating discharge: Over 30 minutes  SIGNED:   Janece Canterbury, MD  [facility] 10/15/2016, 3:48 PM Pager   If 7PM-7AM, please contact night-coverage [email or web address] Password [email or web address]

## 2016-10-15 NOTE — Progress Notes (Signed)
[facility] has been paged about pt toleratering diet and to inform PA how adament pt is about going home today. She has already removed her own NGT, dressed and asking to have her IV removed. This Probation officer informed her that her would IV would not be removed by nursing staff until we had a written order for DC. Will continue to monitor.

## 2016-10-15 NOTE — Progress Notes (Signed)
[facility] Progress Note     Subjective: Patient states "I'm going home today". She is passing flatus and had a bowel movement last night as well as this morning. She denies blood in her stool, nausea, vomiting, fever, or chills.   No NG output charted in epic - ~50 cc in cannister during my exam, bilious   Objective: Vital signs in last 24 hours: Temp:  [98.2 F (36.8 C)-98.5 F (36.9 C)] 98.3 F (36.8 C) (11/21 0551) Pulse Rate:  [79-96] 87 (11/21 0551) Resp:  [16-18] 16 (11/21 0551) BP: (95-150)/(49-70) 115/55 (11/21 0551) SpO2:  [97 %-100 %] 97 % (11/21 0551) Last BM Date: 10/14/16  Intake/Output from previous day: 11/20 0701 - 11/21 0700 In: 1875 [I.V.:1875] Out: -  Intake/Output this shift: Total I/O In: -  Out: 150 [Urine:150]  PE: Gen:  Alert, NAD, pleasant and cooperarive Abd: Soft, NT, mild distention of upper abdomen, +BS  Lab Results:   Recent Labs  10/13/16 1800 10/14/16 0405  WBC 10.7* 7.6  HGB 11.5* 10.6*  HCT 35.6* 32.1*  PLT 405* 376   BMET  Recent Labs  10/13/16 1800 10/14/16 0405  NA 138 139  K 3.8 3.7  CL 103 106  CO2 26 25  GLUCOSE 144* 114*  BUN 14 18  CREATININE 0.83 0.84  CALCIUM 9.2 8.4*   PT/INR No results for input(s): LABPROT, INR in the last 72 hours. CMP     Component Value Date/Time   NA 139 10/14/2016 0405   NA 142 08/14/2016 0925   K 3.7 10/14/2016 0405   K 4.3 08/14/2016 0925   CL 106 10/14/2016 0405   CO2 25 10/14/2016 0405   CO2 28 08/14/2016 0925   GLUCOSE 114 (H) 10/14/2016 0405   GLUCOSE 109 08/14/2016 0925   BUN 18 10/14/2016 0405   BUN 12.9 08/14/2016 0925   CREATININE 0.84 10/14/2016 0405   CREATININE 1.0 08/14/2016 0925   CALCIUM 8.4 (L) 10/14/2016 0405   CALCIUM 9.5 08/14/2016 0925   PROT 7.3 10/14/2016 0405   PROT 7.3 08/14/2016 0925   ALBUMIN 3.8 10/14/2016 0405   ALBUMIN 3.5 08/14/2016 0925   AST 21 10/14/2016 0405   AST 19 08/14/2016 0925   ALT 13 (L) 10/14/2016 0405    ALT 13 08/14/2016 0925   ALKPHOS 137 (H) 10/14/2016 0405   ALKPHOS 211 (H) 08/14/2016 0925   BILITOT 0.5 10/14/2016 0405   BILITOT 0.34 08/14/2016 0925   GFRNONAA >60 10/14/2016 0405   GFRAA >60 10/14/2016 0405   Lipase     Component Value Date/Time   LIPASE 20 10/13/2016 1800   Studies/Results: Ct Abdomen Pelvis W Contrast  Result Date: 10/13/2016 CLINICAL DATA:  Abdominal pain with nausea and vomiting. History of breast carcinoma EXAM: CT ABDOMEN AND PELVIS WITH CONTRAST TECHNIQUE: Multidetector CT imaging of the abdomen and pelvis was performed using the standard protocol following bolus administration of intravenous contrast. CONTRAST:  133mL ISOVUE-300 IOPAMIDOL (ISOVUE-300) INJECTION 61% COMPARISON:  None. FINDINGS: Lower chest: Lung bases are clear. There is a fairly small focal hiatal hernia. Hepatobiliary: Liver measures 19.0 cm in length. There is hepatic steatosis. No focal liver lesions are evident. Gallbladder is mildly distended without appreciable wall thickening. There is no biliary duct dilatation. Pancreas: There is fatty replacement within the pancreas. There is no pancreatic mass or inflammatory focus. Spleen: No splenic lesions are evident. Adrenals/Urinary Tract: Adrenals appear normal bilaterally. There is a cyst in the lower pole of the right kidney measuring  2.0 x 1.5 cm. There is no evident hydronephrosis on either side. There is no renal or ureteral calculus on either side. Urinary bladder is midline with wall thickness within normal limits. Stomach/Bowel: There is bowel dilatation throughout much of the jejunum. There is a transition zone in the mid abdomen just to the left of midline in the distal jejunal region consistent with a degree of bowel obstruction. There is no appreciable free air or portal venous air. Most of the dilated loops of bowel containing fluid. There is no mesenteric or bowel wall thickening. Vascular/Lymphatic: There is atherosclerotic calcification  in the aorta and proximal common iliac arteries, slightly more on the right than on the left. There is no abdominal aortic aneurysm. The major mesenteric vessels appear patent. There is no appreciable adenopathy in the abdomen or pelvis. Reproductive: Uterus is absent. There is a small amount of free fluid in the dependent portion of the pelvis. No pelvic mass evident. Other: Appendix appears normal. No abscess in the abdomen or pelvis. There is a minimal ventral hernia containing only fat. Musculoskeletal: Patient is status post total hip replacement on the left. There is degenerative change in the lumbar spine. There is lytic change in the left twelfth rib, concerning for metastatic disease. A similar appearing lytic lesion is also noted in posterior right ninth rib. IMPRESSION: Evidence of small bowel obstruction with transition zone just to the left of midline in the distal jejunal region. No free air. No abscess. Appendix appears normal. 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 Patient is status post total hip replacement on the left. It may be prudent to correlate with nuclear medicine bone scan to assess for other potential sites of bony metastatic disease. Fairly small hiatal hernia present.  Rather minimal ventral hernia. Prominent liver with hepatic steatosis. No focal liver lesions evident. Gallbladder borderline distended without wall thickening. No renal or ureteral calculus.  No hydronephrosis. Small amount of free fluid in the dependent portion of pelvis. Etiology uncertain. This finding may be due to reaction from small bowel obstruction. Uterus absent. Electronically Signed   By: Lowella Grip III M.D.   On: 10/13/2016 20:05   Dg Abd Portable 1v-small Bowel Obstruction Protocol-initial, 8 Hr Delay  Result Date: 10/14/2016 CLINICAL DATA:  Small bowel obstruction. EXAM: PORTABLE ABDOMEN - 1 VIEW COMPARISON:  Radiograph of same day. FINDINGS: Distal tip of  nasogastric tube is unchanged in position and looped within proximal stomach. No significant colonic dilatation is noted. No significant bowel dilatation is noted. Small phleboliths are noted in the pelvis. Status post left hip arthroplasty. IMPRESSION: No definite evidence of bowel obstruction or ileus. Electronically Signed   By: Marijo Conception, M.D.   On: 10/14/2016 21:47   Dg Abd Portable 1v  Result Date: 10/14/2016 CLINICAL DATA:  Advanced nasogastric tube. EXAM: PORTABLE ABDOMEN - 1 VIEW COMPARISON:  Earlier today FINDINGS: Advanced nasogastric tube with tip and side port below the diaphragm. Unchanged gaseous distension of small bowel. No concerning mass effect or gas collection. Iodinated contrast still visualized in the urinary bladder. IMPRESSION: 1. Advanced nasogastric tube with tip and side port below the diaphragm. 2. Unchanged small bowel distention. Electronically Signed   By: Monte Fantasia M.D.   On: 10/14/2016 10:20   Dg Abd Portable 1v-small Bowel Protocol-position Verification  Result Date: 10/14/2016 CLINICAL DATA:  NG tube. EXAM: PORTABLE ABDOMEN - 1 VIEW COMPARISON:  10/13/2016 . FINDINGS: NG tube noted in unchanged position coiled at  the gastroesophageal junction. A loop of slightly distended small bowel noted. Colonic gas pattern is unremarkable. No free air. Contrast is noted in the right renal collecting system and bladder from prior CT. Left hip replacement. IMPRESSION: 1. NG tube in unchanged position coiled at the gastroesophageal junction. 2. Single slightly prominent loop of small bowel. Follow-up exam suggested to demonstrate resolution . Colonic gas pattern unremarkable. No free air. Electronically Signed   By: Marcello Moores  Register   On: 10/14/2016 08:38   Dg Abd Portable 1v  Result Date: 10/13/2016 CLINICAL DATA:  NG tube placement EXAM: PORTABLE ABDOMEN - 1 VIEW COMPARISON:  CT abdomen pelvis 10/13/2016 FINDINGS: NG tube is coiled in the region of the  gastroesophageal junction. The tip and side port are below the diaphragm. There is a normal bowel gas pattern. Excreted IV contrast is seen within the renal calices. IMPRESSION: NG tube coiled in the region of the gastroesophageal junction. Tip and side port are below the diaphragm. Electronically Signed   By: Ulyses Jarred M.D.   On: 10/13/2016 22:43   Anti-infectives: Anti-infectives    None     Assessment/Plan Small Bowel Obstruction, recurrent             Likely secondary to adhesions, PMH partial hysterectomy                            SB protocol 11/20 - Abd films today show no significant bowel dilatation  Having bowel function after administration of gastrografin            Clamp NGT and start clears  Metastatic breast CA  Dispo: d/c NGT and start clear liquids, advance diet to soft as tolerated  Stable for discharge this evening vs tomorrow AM if tolerating diet Re-start miralax PRN daily      LOS: 2 days    Carla Roth , [facility] 10/15/2016, 9:07 AM Pager: [phone] Consults: [phone] Mon-Fri 7:00 am-4:30 pm Sat-Sun 7:00 am-11:30 am  Agree with above. To start clear liquids and see how she does. Possibly home later today.  She had a similar episode about 2 years ago.  Alphonsa Overall, MD, [facility] Pager: [phone] Office phone:  [phone]

## 2016-10-15 NOTE — Progress Notes (Signed)
DC instructions reviewed with the pt. All questions and concerns were addressed with the pt. Pt alert and oriented times four, VSS, no apparent s/s of distress or discomfort, skin intact, daughter here to tx home.

## 2016-10-15 NOTE — Progress Notes (Signed)
Pt pulled out her own NGT. No c/o pain. No s/s of distress or discomfort at this time. Will continue to monitor.

## 2016-11-12 ENCOUNTER — Other Ambulatory Visit: Payer: Self-pay

## 2016-11-12 DIAGNOSIS — C50919 Malignant neoplasm of unspecified site of unspecified female breast: Secondary | ICD-10-CM

## 2016-11-12 DIAGNOSIS — C7951 Secondary malignant neoplasm of bone: Secondary | ICD-10-CM

## 2016-11-12 NOTE — Assessment & Plan Note (Deleted)
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  Plan: 1. Continue letrozole 2. patient feels clinically very well and does not have any symptoms. She would like to hold off on doing another scan until next year. Our plan is to do the scans in April 2018. 3. Xgeva every 3 months along with calcium and vitamin D Patient plans to bring her daughter who is a Leisure centre manager for volleyball at [employer]. She is also a 10 year breast cancer survivor. Her other daughter is at 95 PM who lives in [place] in track and field. Her son is also a Leisure centre manager for [employer] running back coach  Return to clinic in December for follow-up along with Xgeva injection. If there are any signs of progression, we may have to add Ibrance.

## 2016-11-13 ENCOUNTER — Ambulatory Visit: Payer: Self-pay | Admitting: Hematology and Oncology

## 2016-11-13 ENCOUNTER — Other Ambulatory Visit: Payer: Self-pay

## 2016-11-13 ENCOUNTER — Ambulatory Visit: Payer: Self-pay

## 2016-12-02 ENCOUNTER — Telehealth: Payer: Self-pay | Admitting: Emergency Medicine

## 2016-12-02 NOTE — Telephone Encounter (Signed)
Patient called to state that she was in the hospital and discharged 11/21. Patient states she hasn't received her injection which she normally gets here. Per epic patient had a lab/MD and injection appointment on 11/13/16; patient states she was in [place] at that time.   Message sent to scheduling to reschedule lab/MD and injection. Patient aware.

## 2016-12-04 NOTE — Assessment & Plan Note (Signed)
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 Hospitalization for SBO Nov 2017  Plan: 1. Continue letrozole 2. scans in April 2018. 3. Xgeva every 3 months along with calcium and vitamin D  Patient plans to bring her daughter who is a Leisure centre manager for volleyball at [employer]. She is also a 10 year breast cancer survivor. Her other daughter is at 68 PM who lives in [place] in track and field. Her son is also a Leisure centre manager for [employer] running back coach  Return to clinic in December for follow-up along with Xgeva injection. If there are any signs of progression, we may have to add Ibrance.

## 2016-12-05 ENCOUNTER — Other Ambulatory Visit (HOSPITAL_BASED_OUTPATIENT_CLINIC_OR_DEPARTMENT_OTHER): Payer: Medicare HMO

## 2016-12-05 ENCOUNTER — Encounter: Payer: Self-pay | Admitting: Hematology and Oncology

## 2016-12-05 ENCOUNTER — Ambulatory Visit (HOSPITAL_BASED_OUTPATIENT_CLINIC_OR_DEPARTMENT_OTHER): Payer: Medicare HMO

## 2016-12-05 ENCOUNTER — Ambulatory Visit (HOSPITAL_BASED_OUTPATIENT_CLINIC_OR_DEPARTMENT_OTHER): Payer: Medicare HMO | Admitting: Hematology and Oncology

## 2016-12-05 VITALS — BP 178/84 | HR 84 | Temp 98.6°F | Resp 18 | Ht 66.0 in | Wt 183.1 lb

## 2016-12-05 DIAGNOSIS — C7951 Secondary malignant neoplasm of bone: Secondary | ICD-10-CM | POA: Diagnosis not present

## 2016-12-05 DIAGNOSIS — C50919 Malignant neoplasm of unspecified site of unspecified female breast: Secondary | ICD-10-CM

## 2016-12-05 DIAGNOSIS — C778 Secondary and unspecified malignant neoplasm of lymph nodes of multiple regions: Secondary | ICD-10-CM

## 2016-12-05 DIAGNOSIS — I10 Essential (primary) hypertension: Secondary | ICD-10-CM

## 2016-12-05 DIAGNOSIS — C50912 Malignant neoplasm of unspecified site of left female breast: Secondary | ICD-10-CM

## 2016-12-05 DIAGNOSIS — C7800 Secondary malignant neoplasm of unspecified lung: Secondary | ICD-10-CM

## 2016-12-05 DIAGNOSIS — M898X9 Other specified disorders of bone, unspecified site: Secondary | ICD-10-CM

## 2016-12-05 DIAGNOSIS — K56609 Unspecified intestinal obstruction, unspecified as to partial versus complete obstruction: Secondary | ICD-10-CM

## 2016-12-05 LAB — CBC WITH DIFFERENTIAL/PLATELET
BASO%: 0.8 % (ref 0.0–2.0)
BASOS ABS: 0.1 10*3/uL (ref 0.0–0.1)
EOS%: 5.4 % (ref 0.0–7.0)
Eosinophils Absolute: 0.4 10*3/uL (ref 0.0–0.5)
HCT: 31.3 % — ABNORMAL LOW (ref 34.8–46.6)
HEMOGLOBIN: 10.3 g/dL — AB (ref 11.6–15.9)
LYMPH#: 2.4 10*3/uL (ref 0.9–3.3)
LYMPH%: 36.6 % (ref 14.0–49.7)
MCH: 26.8 pg (ref 25.1–34.0)
MCHC: 32.9 g/dL (ref 31.5–36.0)
MCV: 81.5 fL (ref 79.5–101.0)
MONO#: 0.5 10*3/uL (ref 0.1–0.9)
MONO%: 7.6 % (ref 0.0–14.0)
NEUT#: 3.2 10*3/uL (ref 1.5–6.5)
NEUT%: 49.6 % (ref 38.4–76.8)
Platelets: 342 10*3/uL (ref 145–400)
RBC: 3.84 10*6/uL (ref 3.70–5.45)
RDW: 14.2 % (ref 11.2–14.5)
WBC: 6.4 10*3/uL (ref 3.9–10.3)
nRBC: 0 % (ref 0–0)

## 2016-12-05 LAB — COMPREHENSIVE METABOLIC PANEL
ALBUMIN: 3.7 g/dL (ref 3.5–5.0)
ALK PHOS: 185 U/L — AB (ref 40–150)
ALT: 10 U/L (ref 0–55)
AST: 19 U/L (ref 5–34)
Anion Gap: 9 mEq/L (ref 3–11)
BUN: 14.3 mg/dL (ref 7.0–26.0)
CALCIUM: 9.1 mg/dL (ref 8.4–10.4)
CO2: 24 mEq/L (ref 22–29)
Chloride: 108 mEq/L (ref 98–109)
Creatinine: 0.8 mg/dL (ref 0.6–1.1)
EGFR: 80 mL/min/{1.73_m2} — AB (ref >90)
GLUCOSE: 115 mg/dL (ref 70–140)
POTASSIUM: 3.8 meq/L (ref 3.5–5.1)
SODIUM: 141 meq/L (ref 136–145)
Total Bilirubin: 0.4 mg/dL (ref 0.20–1.20)
Total Protein: 7.6 g/dL (ref 6.4–8.3)

## 2016-12-05 MED ORDER — ACETAMINOPHEN-CODEINE #3 300-30 MG PO TABS: 1.0000 | ORAL_TABLET | Freq: Three times a day (TID) | ORAL | 0 refills | Status: AC | PRN

## 2016-12-05 MED ORDER — LISINOPRIL 5 MG PO TABS: 5.0000 mg | ORAL_TABLET | Freq: Every day | ORAL | 3 refills | Status: DC

## 2016-12-05 MED ORDER — DENOSUMAB 120 MG/1.7ML ~~LOC~~ SOLN
120.0000 mg | Freq: Once | SUBCUTANEOUS | Status: AC
  Administered 2016-12-05: 120 mg via SUBCUTANEOUS
  Filled 2016-12-05: qty 1.7

## 2016-12-05 MED ORDER — ACETAMINOPHEN-CODEINE #3 300-30 MG PO TABS: 1.0000 | ORAL_TABLET | ORAL | 0 refills | Status: DC | PRN

## 2016-12-05 NOTE — Patient Instructions (Signed)
Denosumab injection  What is this medicine?  DENOSUMAB (den oh sue mab) slows bone breakdown. Prolia is used to treat osteoporosis in women after menopause and in men. Xgeva is used to prevent bone fractures and other bone problems caused by cancer bone metastases. Xgeva is also used to treat giant cell tumor of the bone.  This medicine may be used for other purposes; ask your health care provider or pharmacist if you have questions.  What should I tell my health care provider before I take this medicine?  They need to know if you have any of these conditions:  -dental disease  -eczema  -infection or history of infections  -kidney disease or on dialysis  -low blood calcium or vitamin D  -malabsorption syndrome  -scheduled to have surgery or tooth extraction  -taking medicine that contains denosumab  -thyroid or parathyroid disease  -an unusual reaction to denosumab, other medicines, foods, dyes, or preservatives  -pregnant or trying to get pregnant  -breast-feeding  How should I use this medicine?  This medicine is for injection under the skin. It is given by a health care professional in a hospital or clinic setting.  If you are getting Prolia, a special MedGuide will be given to you by the pharmacist with each prescription and refill. Be sure to read this information carefully each time.  For Prolia, talk to your pediatrician regarding the use of this medicine in children. Special care may be needed. For Xgeva, talk to your pediatrician regarding the use of this medicine in children. While this drug may be prescribed for children as young as 13 years for selected conditions, precautions do apply.  Overdosage: If you think you have taken too much of this medicine contact a poison control center or emergency room at once.  NOTE: This medicine is only for you. Do not share this medicine with others.  What if I miss a dose?  It is important not to miss your dose. Call your doctor or health care professional if you are  unable to keep an appointment.  What may interact with this medicine?  Do not take this medicine with any of the following medications:  -other medicines containing denosumab  This medicine may also interact with the following medications:  -medicines that suppress the immune system  -medicines that treat cancer  -steroid medicines like prednisone or cortisone  This list may not describe all possible interactions. Give your health care provider a list of all the medicines, herbs, non-prescription drugs, or dietary supplements you use. Also tell them if you smoke, drink alcohol, or use illegal drugs. Some items may interact with your medicine.  What should I watch for while using this medicine?  Visit your doctor or health care professional for regular checks on your progress. Your doctor or health care professional may order blood tests and other tests to see how you are doing.  Call your doctor or health care professional if you get a cold or other infection while receiving this medicine. Do not treat yourself. This medicine may decrease your body's ability to fight infection.  You should make sure you get enough calcium and vitamin D while you are taking this medicine, unless your doctor tells you not to. Discuss the foods you eat and the vitamins you take with your health care professional.  See your dentist regularly. Brush and floss your teeth as directed. Before you have any dental work done, tell your dentist you are receiving this medicine.  Do   not become pregnant while taking this medicine or for 5 months after stopping it. Women should inform their doctor if they wish to become pregnant or think they might be pregnant. There is a potential for serious side effects to an unborn child. Talk to your health care professional or pharmacist for more information.  What side effects may I notice from receiving this medicine?  Side effects that you should report to your doctor or health care professional as soon as  possible:  -allergic reactions like skin rash, itching or hives, swelling of the face, lips, or tongue  -breathing problems  -chest pain  -fast, irregular heartbeat  -feeling faint or lightheaded, falls  -fever, chills, or any other sign of infection  -muscle spasms, tightening, or twitches  -numbness or tingling  -skin blisters or bumps, or is dry, peels, or red  -slow healing or unexplained pain in the mouth or jaw  -unusual bleeding or bruising  Side effects that usually do not require medical attention (Report these to your doctor or health care professional if they continue or are bothersome.):  -muscle pain  -stomach upset, gas  This list may not describe all possible side effects. Call your doctor for medical advice about side effects. You may report side effects to FDA at 1-800-FDA-1088.  Where should I keep my medicine?  This medicine is only given in a clinic, doctor's office, or other health care setting and will not be stored at home.  NOTE: This sheet is a summary. It may not cover all possible information. If you have questions about this medicine, talk to your doctor, pharmacist, or health care provider.      2016, Elsevier/Gold Standard. (2012-05-11 12:37:47)

## 2016-12-05 NOTE — Progress Notes (Signed)
Patient Care Team: No Pcp Per Patient as PCP - General (General Practice)  DIAGNOSIS:  Encounter Diagnosis  Name Primary?  . Metastatic breast cancer ([place])     SUMMARY OF ONCOLOGIC HISTORY:   Metastatic breast cancer ([place])   08/03/2014 Initial Diagnosis    Left hip pathological fracture: Status post left hip arthroplasty, diagnosis metastatic breast cancer ER/PR positive HER-2 negative (at [facility])      09/06/2014 - 09/30/2014 Radiation Therapy    Palliative radiation to the left hip      10/03/2014 -  Anti-estrogen oral therapy    Letrozole 2.5 mg daily with denosumab every 3 months      03/12/2016 Imaging    Stable mediastinal and right axillary lymph nodes with stable bilateral lung nodules, left 12th rib lesion stable      10/13/2016 - 10/15/2016 Hospital Admission    SBO treated conservatively       CHIEF COMPLIANT: Follow-up of metastatic breast cancer  INTERVAL HISTORY: Carla Roth is a 76 year old with metastatic breast cancer with lung, lymph node, bone metastases. She is currently on letrozole with Xgeva. She was admitted to the hospital in November 2017 with small bowel obstruction and was treated conservatively. She has not had any further problems since then. Her last scans were done in April which showed stable findings.  She complains of bone pain.  REVIEW OF SYSTEMS:   Constitutional: Denies fevers, chills or abnormal weight loss Eyes: Denies blurriness of vision Ears, nose, mouth, throat, and face: Denies mucositis or sore throat Respiratory: Denies cough, dyspnea or wheezes Cardiovascular: Denies palpitation, chest discomfort Gastrointestinal:  Denies nausea, heartburn or change in bowel habits Skin: Denies abnormal skin rashes Lymphatics: Denies new lymphadenopathy or easy bruising Neurological:Denies numbness, tingling or new weaknesses Behavioral/Psych: Mood is stable, no new changes  Extremities: No lower extremity edema All other systems  were reviewed with the patient and are negative.  I have reviewed the past medical history, past surgical history, social history and family history with the patient and they are unchanged from previous note.  ALLERGIES:  has No Known Allergies.  MEDICATIONS:  Current Outpatient Prescriptions  Medication Sig Dispense Refill  . acetaminophen-codeine (TYLENOL #3) 300-30 MG tablet Take 1 tablet by mouth every 8 (eight) hours as needed for moderate pain. 30 tablet 0  . furosemide (LASIX) 20 MG tablet Take 1 tablet (20 mg total) by mouth daily. (Patient taking differently: Take 20 mg by mouth as needed. ) 30 tablet 1  . letrozole (FEMARA) 2.5 MG tablet 2.5 mg once daily  3  . lisinopril (PRINIVIL,ZESTRIL) 5 MG tablet Take 1 tablet (5 mg total) by mouth daily. 30 tablet 3  . Multiple Vitamin (MULTIVITAMIN WITH MINERALS) TABS tablet Take 2 tablets by mouth daily.    . naproxen sodium (ANAPROX) 220 MG tablet Take 440 mg by mouth 2 (two) times daily with a meal.    . polyethylene glycol (MIRALAX / GLYCOLAX) packet Take 17 g by mouth daily as needed for moderate constipation. 30 each 0  . zolpidem (AMBIEN) 5 MG tablet 42m at bedtime for sleep  1   No current facility-administered medications for this visit.    Facility-Administered Medications Ordered in Other Visits  Medication Dose Route Frequency Provider Last Rate Last Dose  . denosumab (XGEVA) injection 120 mg  120 mg Subcutaneous Once VNicholas Lose MD        PHYSICAL EXAMINATION: ECOG PERFORMANCE STATUS: 1 - Symptomatic but completely ambulatory  Vitals:  12/05/16 0906  BP: (!) 178/84  Pulse: 84  Resp: 18  Temp: 98.6 F (37 C)   Filed Weights   12/05/16 0906  Weight: 183 lb 1.6 oz (83.1 kg)    GENERAL:alert, no distress and comfortable SKIN: skin color, texture, turgor are normal, no rashes or significant lesions EYES: normal, Conjunctiva are pink and non-injected, sclera clear OROPHARYNX:no exudate, no erythema and lips,  buccal mucosa, and tongue normal  NECK: supple, thyroid normal size, non-tender, without nodularity LYMPH:  no palpable lymphadenopathy in the cervical, axillary or inguinal LUNGS: clear to auscultation and percussion with normal breathing effort HEART: regular rate & rhythm and no murmurs and no lower extremity edema ABDOMEN:abdomen soft, non-tender and normal bowel sounds MUSCULOSKELETAL:no cyanosis of digits and no clubbing  NEURO: alert & oriented x 3 with fluent speech, no focal motor/sensory deficits EXTREMITIES: No lower extremity edema   LABORATORY DATA:  I have reviewed the data as listed   Chemistry      Component Value Date/Time   NA 141 12/05/2016 0852   K 3.8 12/05/2016 0852   CL 106 10/14/2016 0405   CO2 24 12/05/2016 0852   BUN 14.3 12/05/2016 0852   CREATININE 0.8 12/05/2016 0852      Component Value Date/Time   CALCIUM 9.1 12/05/2016 0852   ALKPHOS 185 (H) 12/05/2016 0852   AST 19 12/05/2016 0852   ALT 10 12/05/2016 0852   BILITOT 0.40 12/05/2016 0852       Lab Results  Component Value Date   WBC 6.4 12/05/2016   HGB 10.3 (L) 12/05/2016   HCT 31.3 (L) 12/05/2016   MCV 81.5 12/05/2016   PLT 342 12/05/2016   NEUTROABS 3.2 12/05/2016    ASSESSMENT & PLAN:  Metastatic breast cancer ([place]) Metastatic left breast cancer with bone, lymph node, 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 Hospitalization for SBO Nov 2017  Plan: 1. Continue letrozole 2. scans in April 2018. 3. Xgeva every 3 months along with calcium and vitamin D.She will receive an injection today.  Small bowel obstruction November 2017 required hospitalization and conservative management felt to be related to strictures. Bone pain: Sent a prescription for Tylenol 3 I gave her a prescription for  handicap placard  Hypertension: I increase the dosage of lisinopril to 5 mg daily.  Patient plans to bring her daughter who is a Leisure centre manager for volleyball at [employer]. She is also a 10 year breast cancer survivor. Her other daughter lives in [place] and is a Leisure centre manager and [employer] in [employer] worker. Her son is also a Leisure centre manager for [employer] running back coach  Return to clinic in April after scans and for follow-up along with Xgeva injection. If there are any signs of progression, we may have to add Ibrance.  I spent 25 minutes talking to the patient of which more than half was spent in counseling and coordination of care.  No orders of the defined types were placed in this encounter.  The patient has a good understanding of the overall plan. she agrees with it. she will call with any problems that may develop before the next visit here.   Rulon Eisenmenger, MD 12/05/16

## 2016-12-09 ENCOUNTER — Other Ambulatory Visit: Payer: Self-pay

## 2016-12-09 DIAGNOSIS — C50919 Malignant neoplasm of unspecified site of unspecified female breast: Secondary | ICD-10-CM

## 2016-12-09 DIAGNOSIS — C7951 Secondary malignant neoplasm of bone: Secondary | ICD-10-CM

## 2016-12-09 NOTE — Progress Notes (Signed)
Called pt to schedule ct scans and bone scans. Pt states that she had discussed results already with Dr.Gudena and does not need to have scans done at this time.

## 2017-01-24 ENCOUNTER — Emergency Department (INDEPENDENT_AMBULATORY_CARE_PROVIDER_SITE_OTHER)
Admission: EM | Admit: 2017-01-24 | Discharge: 2017-01-24 | Disposition: A | Discharge status: Home / Self Care | Payer: Medicare HMO | Source: Home / Self Care | Attending: Family Medicine | Admitting: Family Medicine

## 2017-01-24 ENCOUNTER — Encounter: Payer: Self-pay | Admitting: *Deleted

## 2017-01-24 ENCOUNTER — Emergency Department (INDEPENDENT_AMBULATORY_CARE_PROVIDER_SITE_OTHER): Payer: Medicare HMO

## 2017-01-24 DIAGNOSIS — M1712 Unilateral primary osteoarthritis, left knee: Secondary | ICD-10-CM

## 2017-01-24 DIAGNOSIS — M25562 Pain in left knee: Secondary | ICD-10-CM

## 2017-01-24 DIAGNOSIS — G8929 Other chronic pain: Secondary | ICD-10-CM

## 2017-01-24 MED ORDER — MELOXICAM 7.5 MG PO TABS: 7.5000 mg | ORAL_TABLET | Freq: Every day | ORAL | 1 refills | Status: AC

## 2017-01-24 MED ORDER — ACETAMINOPHEN-CODEINE #3 300-30 MG PO TABS: 1.0000 | ORAL_TABLET | Freq: Four times a day (QID) | ORAL | 0 refills | Status: DC | PRN

## 2017-01-24 NOTE — Discharge Instructions (Signed)
Wear knee brace daytime.  Apply heating pad two to three times daily.

## 2017-01-24 NOTE — ED Triage Notes (Signed)
Patient c/o anterior left knee pain without injury x 3 months. Pain is gradually worsening, today is the worse. H/o left hip surgery 1 years ago. H/o bone CA. Taking tylenol # 3 without relief.

## 2017-01-24 NOTE — ED Provider Notes (Signed)
[facility]    CSN: [id number] Arrival date & time: 01/24/17  1522     History   Chief Complaint Chief Complaint  Patient presents with  . Knee Pain    HPI Carla Roth is a 76 y.o. female.   Patient complains of persistent pain in her left knee for 2+ months.  She has had swelling and pain with weight bearing, not improved with Aleve.  She recalls no injury. She has a past history of breast CA metastatic to her left hip, resulting in left hip pathological fracture.  She is s/p left hip arthroplasty.  She states that a follow-up scan last month was negative.   The history is provided by the patient.  Knee Pain  Location:  Knee Time since incident:  2 months Injury: no   Knee location:  L knee Pain details:    Quality:  Aching   Radiates to:  Does not radiate   Severity:  Mild   Onset quality:  Gradual   Duration:  2 months   Timing:  Constant   Progression:  Worsening Chronicity:  New Prior injury to area:  No Relieved by:  Nothing Worsened by:  Bearing weight Ineffective treatments:  NSAIDs Associated symptoms: decreased ROM, stiffness and swelling   Associated symptoms: no back pain, no fatigue, no muscle weakness, no numbness and no tingling   Risk factors: known bone disorder     Past Medical History:  Diagnosis Date  . Cancer (HCC)    Bone  . Hypertension     Patient Active Problem List   Diagnosis Date Noted  . Small bowel obstruction 10/13/2016  . Accelerated hypertension 10/13/2016  . Metastatic breast cancer ([place]) 08/07/2016  . Bone metastases ([place]) 08/07/2016    History reviewed. No pertinent surgical history.  OB History    No data available       Home Medications    Prior to Admission medications   Medication Sig Start Date End Date Taking? Authorizing Provider  acetaminophen-codeine (TYLENOL #3) 300-30 MG tablet Take 1 tablet by mouth every 6 (six) hours as needed for moderate pain. 01/24/17   Kandra Nicolas, MD    furosemide (LASIX) 20 MG tablet Take 1 tablet (20 mg total) by mouth daily. Patient taking differently: Take 20 mg by mouth as needed.  09/10/16   Nicholas Lose, MD  letrozole (FEMARA) 2.5 MG tablet 2.5 mg once daily    Historical Provider, MD  lisinopril (PRINIVIL,ZESTRIL) 5 MG tablet Take 1 tablet (5 mg total) by mouth daily. 12/05/16   Nicholas Lose, MD  meloxicam (MOBIC) 7.5 MG tablet Take 1 tablet (7.5 mg total) by mouth daily. (Take with food) 01/24/17   Kandra Nicolas, MD  Multiple Vitamin (MULTIVITAMIN WITH MINERALS) TABS tablet Take 2 tablets by mouth daily.    Historical Provider, MD  naproxen sodium (ANAPROX) 220 MG tablet Take 440 mg by mouth 2 (two) times daily with a meal.    Historical Provider, MD  polyethylene glycol (MIRALAX / GLYCOLAX) packet Take 17 g by mouth daily as needed for moderate constipation. 10/15/16   Janece Canterbury, MD  zolpidem (AMBIEN) 5 MG tablet 5mg  at bedtime for sleep 07/31/16   Historical Provider, MD    Family History Family History  Problem Relation Age of Onset  . CAD Mother   . Diabetes Father   . Breast cancer Other   . Diabetes Sister   . Diabetes Brother     Social History Social History  Substance Use Topics  . Smoking status: Never Smoker  . Smokeless tobacco: Never Used  . Alcohol use No     Allergies   Patient has no known allergies.   Review of Systems Review of Systems  Constitutional: Negative for fatigue.  Musculoskeletal: Positive for stiffness. Negative for back pain.  All other systems reviewed and are negative.    Physical Exam Triage Vital Signs ED Triage Vitals  Enc Vitals Group     BP 01/24/17 1553 154/87     Pulse Rate 01/24/17 1553 75     Resp --      Temp --      Temp src --      SpO2 01/24/17 1553 100 %     Weight 01/24/17 1553 160 lb (72.6 kg)     Height --      Head Circumference --      Peak Flow --      Pain Score 01/24/17 1555 10     Pain Loc --      Pain Edu? --      Excl. in [place]? --    No  data found.   Updated Vital Signs BP 154/87 (BP Location: Left Arm)   Pulse 75   Wt 160 lb (72.6 kg)   SpO2 100%   BMI 25.82 kg/m   Visual Acuity Right Eye Distance:   Left Eye Distance:   Bilateral Distance:    Right Eye Near:   Left Eye Near:    Bilateral Near:     Physical Exam  Constitutional: She appears well-developed and well-nourished. No distress.  HENT:  Head: Normocephalic.  Mouth/Throat: Oropharynx is clear and moist.  Eyes: Conjunctivae are normal. Pupils are equal, round, and reactive to light.  Neck: Normal range of motion.  Cardiovascular: Normal heart sounds.   Pulmonary/Chest: Breath sounds normal.  Abdominal: There is no tenderness.  Musculoskeletal: She exhibits no edema.       Left knee: She exhibits decreased range of motion and swelling. She exhibits no ecchymosis, no deformity, no laceration, no erythema and normal alignment. Tenderness found. Lateral joint line tenderness noted.       Legs: Left knee:  Decreased range of motion; has pain with passive flexion beyond about 30 degrees from full extension.  No effusion, erythema, or warmth.  Knee stable, negative drawer test.  McMurray test negative.  Distal neurovascular function is intact.   Neurological: She is alert.  Skin: Skin is warm and dry. No rash noted.  Nursing note and vitals reviewed.    UC Treatments / Results  Labs (all labs ordered are listed, but only abnormal results are displayed) Labs Reviewed - No data to display  EKG  EKG Interpretation None       Radiology Dg Knee Complete 4 Views Left  Result Date: 01/24/2017 CLINICAL DATA:  Left knee pain for 3 months. EXAM: LEFT KNEE - COMPLETE 4+ VIEW COMPARISON:  None. FINDINGS: No fracture or dislocation is noted. Minimal suprapatellar joint effusion is noted. Moderate narrowing of the lateral joint space is noted with osteophyte formation. Mild osteophyte formation is noted medially. No soft tissue abnormality is noted.  IMPRESSION: Moderate degenerative joint disease. Minimal suprapatellar joint effusion. No fracture or dislocation is noted. Electronically Signed   By: Marijo Conception, M.D.   On: 01/24/2017 16:48    Procedures Procedures (including critical care time)  Medications Ordered in UC Medications - No data to display   Initial Impression / Assessment  and Plan / UC Course  I have reviewed the triage vital signs and the nursing notes.  Pertinent labs & imaging results that were available during my care of the patient were reviewed by me and considered in my medical decision making (see chart for details).    Dispensed hinged knee brace. Begin Mobic 7.5mg  QAM.  Rx for Tylenol #3. Wear knee brace daytime.  Apply heating pad two to three times daily. Followup with Dr. Aundria Mems or Dr. Lynne Leader ([facility]) for further evaluation/management.    Final Clinical Impressions(s) / UC Diagnoses   Final diagnoses:  Chronic pain of left knee  Primary osteoarthritis of left knee    New Prescriptions New Prescriptions   ACETAMINOPHEN-CODEINE (TYLENOL #3) 300-30 MG TABLET    Take 1 tablet by mouth every 6 (six) hours as needed for moderate pain.   MELOXICAM (MOBIC) 7.5 MG TABLET    Take 1 tablet (7.5 mg total) by mouth daily. (Take with food)     Kandra Nicolas, MD 01/27/17 2117

## 2017-02-10 ENCOUNTER — Telehealth: Payer: Self-pay | Admitting: Emergency Medicine

## 2017-02-10 NOTE — Telephone Encounter (Signed)
Patient called today asking for refill on Tylenol 3. I asked if she had followed up with [facility] or Dr Georgina Snell, she had not, she said she is seeing Oncologist at the end of the month. I spoke to [place], she advised her to call PCP or Oncologist for pain meds. I advised patient that the prescribing physician was not here today and that she should contact her PCP or Oncologist and she said the last doctor she saw was here and that's who gave her the medicine and that's who should give her a refill. I told her it was a controlled substance and we couldn't call it in anyway. She hung up on me.

## 2017-02-13 ENCOUNTER — Telehealth: Payer: Self-pay

## 2017-02-13 NOTE — Telephone Encounter (Signed)
Pt called in to see if Dr.Gudena could refill her tylenol 3 and Lorrin Mais, since her old dr will no longer refill it for her. Told pt that I can refill it today, but pt wanted to wait until her appointment with Dr.Gudena in April. Pt just wanted to call to give a heads up about her refill request. Pt states that she still has 30 day supply of meds. Confirmed time/date with pt. Pt verbalized understanding and has no further questions at this time.

## 2017-03-04 ENCOUNTER — Encounter (HOSPITAL_COMMUNITY): Payer: Self-pay

## 2017-03-04 ENCOUNTER — Ambulatory Visit (HOSPITAL_COMMUNITY): Payer: Self-pay

## 2017-03-06 ENCOUNTER — Other Ambulatory Visit: Payer: Self-pay

## 2017-03-06 DIAGNOSIS — C50919 Malignant neoplasm of unspecified site of unspecified female breast: Secondary | ICD-10-CM

## 2017-03-06 DIAGNOSIS — C7951 Secondary malignant neoplasm of bone: Secondary | ICD-10-CM

## 2017-03-07 ENCOUNTER — Other Ambulatory Visit (HOSPITAL_BASED_OUTPATIENT_CLINIC_OR_DEPARTMENT_OTHER): Payer: Medicare HMO

## 2017-03-07 ENCOUNTER — Ambulatory Visit (HOSPITAL_BASED_OUTPATIENT_CLINIC_OR_DEPARTMENT_OTHER): Payer: Medicare HMO

## 2017-03-07 ENCOUNTER — Ambulatory Visit (HOSPITAL_BASED_OUTPATIENT_CLINIC_OR_DEPARTMENT_OTHER): Payer: Medicare HMO | Admitting: Hematology and Oncology

## 2017-03-07 ENCOUNTER — Encounter: Payer: Self-pay | Admitting: Hematology and Oncology

## 2017-03-07 VITALS — BP 159/63 | HR 91 | Temp 97.9°F | Resp 17 | Ht 66.0 in | Wt 182.5 lb

## 2017-03-07 DIAGNOSIS — C7951 Secondary malignant neoplasm of bone: Secondary | ICD-10-CM

## 2017-03-07 DIAGNOSIS — C50912 Malignant neoplasm of unspecified site of left female breast: Secondary | ICD-10-CM

## 2017-03-07 DIAGNOSIS — C50919 Malignant neoplasm of unspecified site of unspecified female breast: Secondary | ICD-10-CM

## 2017-03-07 DIAGNOSIS — I10 Essential (primary) hypertension: Secondary | ICD-10-CM

## 2017-03-07 LAB — COMPREHENSIVE METABOLIC PANEL
ALBUMIN: 3.5 g/dL (ref 3.5–5.0)
ALK PHOS: 154 U/L — AB (ref 40–150)
ALT: 10 U/L (ref 0–55)
ANION GAP: 9 meq/L (ref 3–11)
AST: 19 U/L (ref 5–34)
BILIRUBIN TOTAL: 0.36 mg/dL (ref 0.20–1.20)
BUN: 13.8 mg/dL (ref 7.0–26.0)
CO2: 26 mEq/L (ref 22–29)
Calcium: 8.9 mg/dL (ref 8.4–10.4)
Chloride: 108 mEq/L (ref 98–109)
Creatinine: 0.8 mg/dL (ref 0.6–1.1)
EGFR: 84 mL/min/{1.73_m2} — AB (ref >90)
GLUCOSE: 140 mg/dL (ref 70–140)
Potassium: 3.5 mEq/L (ref 3.5–5.1)
SODIUM: 143 meq/L (ref 136–145)
TOTAL PROTEIN: 7.1 g/dL (ref 6.4–8.3)

## 2017-03-07 LAB — CBC WITH DIFFERENTIAL/PLATELET
BASO%: 0.9 % (ref 0.0–2.0)
Basophils Absolute: 0.1 10*3/uL (ref 0.0–0.1)
EOS%: 5.4 % (ref 0.0–7.0)
Eosinophils Absolute: 0.3 10*3/uL (ref 0.0–0.5)
HCT: 31.1 % — ABNORMAL LOW (ref 34.8–46.6)
HGB: 10.1 g/dL — ABNORMAL LOW (ref 11.6–15.9)
LYMPH%: 31.3 % (ref 14.0–49.7)
MCH: 26.5 pg (ref 25.1–34.0)
MCHC: 32.6 g/dL (ref 31.5–36.0)
MCV: 81.3 fL (ref 79.5–101.0)
MONO#: 0.4 10*3/uL (ref 0.1–0.9)
MONO%: 6.1 % (ref 0.0–14.0)
NEUT#: 3.3 10*3/uL (ref 1.5–6.5)
NEUT%: 56.3 % (ref 38.4–76.8)
Platelets: 344 10*3/uL (ref 145–400)
RBC: 3.83 10*6/uL (ref 3.70–5.45)
RDW: 13.9 % (ref 11.2–14.5)
WBC: 5.9 10*3/uL (ref 3.9–10.3)
lymph#: 1.9 10*3/uL (ref 0.9–3.3)

## 2017-03-07 MED ORDER — ZOLPIDEM TARTRATE 5 MG PO TABS: ORAL_TABLET | ORAL | 1 refills | Status: DC

## 2017-03-07 MED ORDER — ACETAMINOPHEN-CODEINE #3 300-30 MG PO TABS: 1.0000 | ORAL_TABLET | Freq: Four times a day (QID) | ORAL | 0 refills | Status: DC | PRN

## 2017-03-07 MED ORDER — DENOSUMAB 120 MG/1.7ML ~~LOC~~ SOLN
120.0000 mg | Freq: Once | SUBCUTANEOUS | Status: AC
  Administered 2017-03-07: 120 mg via SUBCUTANEOUS
  Filled 2017-03-07: qty 1.7

## 2017-03-07 NOTE — Progress Notes (Signed)
Patient Care Team: No Pcp Per Patient as PCP - General (General Practice)  DIAGNOSIS:  Encounter Diagnoses  Name Primary?  . Metastatic breast cancer ([place])   . Bone metastases ([place]) Yes    SUMMARY OF ONCOLOGIC HISTORY:   Metastatic breast cancer ([place])   08/03/2014 Initial Diagnosis    Left hip pathological fracture: Status post left hip arthroplasty, diagnosis metastatic breast cancer ER/PR positive HER-2 negative (at [facility])      09/06/2014 - 09/30/2014 Radiation Therapy    Palliative radiation to the left hip      10/03/2014 -  Anti-estrogen oral therapy    Letrozole 2.5 mg daily with denosumab every 3 months      03/12/2016 Imaging    Stable mediastinal and right axillary lymph nodes with stable bilateral lung nodules, left 12th rib lesion stable      10/13/2016 - 10/15/2016 Hospital Admission    SBO treated conservatively       CHIEF COMPLIANT: Follow-up on letrozole therapy  INTERVAL HISTORY: Carla Roth is a 76 year old with above-mentioned history metastatic breast cancer who is here on letrozole therapy along with Xgeva every 3 months that started in 2015. She was supposed to have had scans and follow-up however scans had not been done. She had a prior problem with small bowel obstruction which was treated conservatively and it appears to be doing better. Patient is paranoid that all of her information is getting transmitted to outside people and because of this she refused to get CT scans or even blood draw today. She tells me that she trusts me completely but she does not trust others.  REVIEW OF SYSTEMS:   Constitutional: Denies fevers, chills or abnormal weight loss Eyes: Denies blurriness of vision Ears, nose, mouth, throat, and face: Denies mucositis or sore throat Respiratory: Denies cough, dyspnea or wheezes Cardiovascular: Denies palpitation, chest discomfort Gastrointestinal:  Denies nausea, heartburn or change in bowel habits Skin: Denies  abnormal skin rashes Lymphatics: Denies new lymphadenopathy or easy bruising Neurological:Denies numbness, tingling or new weaknesses Behavioral/Psych: Paranoid about her information being shared with others  Extremities: No lower extremity edema All other systems were reviewed with the patient and are negative.  I have reviewed the past medical history, past surgical history, social history and family history with the patient and they are unchanged from previous note. Her daughter is a Leisure centre manager for volleyball at [employer]. She is also a 10 year breast cancer survivor. Her other daughter lives in [place] and is a Leisure centre manager and [place] in Youth worker. Her son is also a Leisure centre manager for [employer] running back coach  ALLERGIES:  has No Known Allergies.  MEDICATIONS:  Current Outpatient Prescriptions  Medication Sig Dispense Refill  . acetaminophen-codeine (TYLENOL #3) 300-30 MG tablet Take 1 tablet by mouth every 6 (six) hours as needed for moderate pain. 60 tablet 0  . furosemide (LASIX) 20 MG tablet Take 1 tablet (20 mg total) by mouth daily. (Patient taking differently: Take 20 mg by mouth as needed. ) 30 tablet 1  . letrozole (FEMARA) 2.5 MG tablet 2.5 mg once daily  3  . lisinopril (PRINIVIL,ZESTRIL) 5 MG tablet Take 1 tablet (5 mg total) by mouth daily. 30 tablet 3  . meloxicam (MOBIC) 7.5 MG tablet Take 1 tablet (7.5 mg total) by mouth daily. (Take with food) 15 tablet 1  . Multiple Vitamin (MULTIVITAMIN WITH MINERALS) TABS tablet Take 2 tablets by mouth daily.    . naproxen sodium (ANAPROX)  220 MG tablet Take 440 mg by mouth 2 (two) times daily with a meal.    . polyethylene glycol (MIRALAX / GLYCOLAX) packet Take 17 g by mouth daily as needed for moderate constipation. 30 each 0  . zolpidem (AMBIEN) 5 MG tablet 59m at bedtime for sleep 30 tablet 1   No current facility-administered medications for this visit.     PHYSICAL EXAMINATION: ECOG PERFORMANCE STATUS: 1 -  Symptomatic but completely ambulatory  Vitals:   03/07/17 0848  BP: (!) 159/63  Pulse: 91  Resp: 17  Temp: 97.9 F (36.6 C)   Filed Weights   03/07/17 0848  Weight: 182 lb 8 oz (82.8 kg)    GENERAL:alert, no distress and comfortable SKIN: skin color, texture, turgor are normal, no rashes or significant lesions EYES: normal, Conjunctiva are pink and non-injected, sclera clear OROPHARYNX:no exudate, no erythema and lips, buccal mucosa, and tongue normal  NECK: supple, thyroid normal size, non-tender, without nodularity LYMPH:  no palpable lymphadenopathy in the cervical, axillary or inguinal LUNGS: clear to auscultation and percussion with normal breathing effort HEART: regular rate & rhythm and no murmurs and no lower extremity edema ABDOMEN:abdomen soft, non-tender and normal bowel sounds MUSCULOSKELETAL:no cyanosis of digits and no clubbing  NEURO: alert & oriented x 3 with fluent speech, no focal motor/sensory deficits EXTREMITIES: No lower extremity edema  LABORATORY DATA:  I have reviewed the data as listed   Chemistry      Component Value Date/Time   NA 143 03/07/2017 0836   K 3.5 03/07/2017 0836   CL 106 10/14/2016 0405   CO2 26 03/07/2017 0836   BUN 13.8 03/07/2017 0836   CREATININE 0.8 03/07/2017 0836      Component Value Date/Time   CALCIUM 8.9 03/07/2017 0836   ALKPHOS 154 (H) 03/07/2017 0836   AST 19 03/07/2017 0836   ALT 10 03/07/2017 0836   BILITOT 0.36 03/07/2017 0836       Lab Results  Component Value Date   WBC 5.9 03/07/2017   HGB 10.1 (L) 03/07/2017   HCT 31.1 (L) 03/07/2017   MCV 81.3 03/07/2017   PLT 344 03/07/2017   NEUTROABS 3.3 03/07/2017    ASSESSMENT & PLAN:  Metastatic breast cancer (HCC) Metastatic left breast cancer with bone, lymph node, lung metastases diagnosed September 2015 status post left hip arthroplasty followed by left hip radiation  Current treatment: Letrozole 2.5 mg daily started October 2015 Letrozole  toxicities: No side effects or letrozole therapy. Hospitalization for SBO Nov 2017  Plan: 1. Continue letrozole 2. scansin April 2018. 3. Xgeva every 3 months along with calcium and vitamin D.She will receive an injection today.  Small bowel obstruction November 2017 required hospitalization and conservative management felt to be related to strictures. Bone pain: Sent a prescription for Tylenol 3 And Ambien  Hypertension:On lisinopril 5 mg daily.  Paranoid behavior: Patient believes that her information is being transmitted outside. I discussed with her that we take privacy extremely seriously and that by law  her information is [place] protected. We do not transfer her information to anyone that is not involved in her care. She mentioned instances where people in the hospital appear to know her history. I informed her that during hospitalization her medical record is required to be reviewed by the attending doctors so that they can understand her complete history. She then started discussing about people on the street knocking on her door about her health issues.  I offered her counseling through our  case Freight forwarder and Education officer, museum. She refused. I discussed with her that if she would not allow [place] to do blood work or scans that I cannot treat her effectively. She did finally allow [place] to draw blood so that she can get the Xgeva injection. I offered her transfer to another facility like [facility] for continuation of her care if she does not trust [employer]. She did not seem keen on doing that either. I will postpone her scans for 3 months from now. If she calls me back to transfer her care will be happy to do that for her. Scans are essentially becauseIf there are any signs of progression, we may have to add Ibrance.  I spent 25 minutes talking to the patient of which more than half was spent in counseling and coordination of care.  No orders of the defined types were placed in this  encounter.  The patient has a good understanding of the overall plan. she agrees with it. she will call with any problems that may develop before the next visit here.   Rulon Eisenmenger, MD 03/07/17

## 2017-03-07 NOTE — Assessment & Plan Note (Signed)
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 Hospitalization for SBO Nov 2017  Plan: 1. Continue letrozole 2. scansin April 2018. 3. Xgeva every 3 months along with calcium and vitamin D.She will receive an injection today.  Small bowel obstruction November 2017 required hospitalization and conservative management felt to be related to strictures. Bone pain: Sent a prescription for Tylenol 3 I gave her a prescription for handicap placard  Hypertension:On lisinopril 5 mg daily.  Patient plans to bring her daughter who is a Leisure centre manager for volleyball at [employer]. She is also a 10 year breast cancer survivor. Her other daughter lives in [place] and is a Leisure centre manager and [place] in Youth worker. Her son is also a Leisure centre manager for [employer] running back coach  Patient was supposed to have had scans. The scans had not been performed. We will try to reorder the scans.If there are any signs of progression, we may have to add Ibrance.

## 2017-03-07 NOTE — Patient Instructions (Signed)
Denosumab injection  What is this medicine?  DENOSUMAB (den oh sue mab) slows bone breakdown. Prolia is used to treat osteoporosis in women after menopause and in men. Xgeva is used to prevent bone fractures and other bone problems caused by cancer bone metastases. Xgeva is also used to treat giant cell tumor of the bone.  This medicine may be used for other purposes; ask your health care provider or pharmacist if you have questions.  What should I tell my health care provider before I take this medicine?  They need to know if you have any of these conditions:  -dental disease  -eczema  -infection or history of infections  -kidney disease or on dialysis  -low blood calcium or vitamin D  -malabsorption syndrome  -scheduled to have surgery or tooth extraction  -taking medicine that contains denosumab  -thyroid or parathyroid disease  -an unusual reaction to denosumab, other medicines, foods, dyes, or preservatives  -pregnant or trying to get pregnant  -breast-feeding  How should I use this medicine?  This medicine is for injection under the skin. It is given by a health care professional in a hospital or clinic setting.  If you are getting Prolia, a special MedGuide will be given to you by the pharmacist with each prescription and refill. Be sure to read this information carefully each time.  For Prolia, talk to your pediatrician regarding the use of this medicine in children. Special care may be needed. For Xgeva, talk to your pediatrician regarding the use of this medicine in children. While this drug may be prescribed for children as young as 13 years for selected conditions, precautions do apply.  Overdosage: If you think you have taken too much of this medicine contact a poison control center or emergency room at once.  NOTE: This medicine is only for you. Do not share this medicine with others.  What if I miss a dose?  It is important not to miss your dose. Call your doctor or health care professional if you are  unable to keep an appointment.  What may interact with this medicine?  Do not take this medicine with any of the following medications:  -other medicines containing denosumab  This medicine may also interact with the following medications:  -medicines that suppress the immune system  -medicines that treat cancer  -steroid medicines like prednisone or cortisone  This list may not describe all possible interactions. Give your health care provider a list of all the medicines, herbs, non-prescription drugs, or dietary supplements you use. Also tell them if you smoke, drink alcohol, or use illegal drugs. Some items may interact with your medicine.  What should I watch for while using this medicine?  Visit your doctor or health care professional for regular checks on your progress. Your doctor or health care professional may order blood tests and other tests to see how you are doing.  Call your doctor or health care professional if you get a cold or other infection while receiving this medicine. Do not treat yourself. This medicine may decrease your body's ability to fight infection.  You should make sure you get enough calcium and vitamin D while you are taking this medicine, unless your doctor tells you not to. Discuss the foods you eat and the vitamins you take with your health care professional.  See your dentist regularly. Brush and floss your teeth as directed. Before you have any dental work done, tell your dentist you are receiving this medicine.  Do   not become pregnant while taking this medicine or for 5 months after stopping it. Women should inform their doctor if they wish to become pregnant or think they might be pregnant. There is a potential for serious side effects to an unborn child. Talk to your health care professional or pharmacist for more information.  What side effects may I notice from receiving this medicine?  Side effects that you should report to your doctor or health care professional as soon as  possible:  -allergic reactions like skin rash, itching or hives, swelling of the face, lips, or tongue  -breathing problems  -chest pain  -fast, irregular heartbeat  -feeling faint or lightheaded, falls  -fever, chills, or any other sign of infection  -muscle spasms, tightening, or twitches  -numbness or tingling  -skin blisters or bumps, or is dry, peels, or red  -slow healing or unexplained pain in the mouth or jaw  -unusual bleeding or bruising  Side effects that usually do not require medical attention (Report these to your doctor or health care professional if they continue or are bothersome.):  -muscle pain  -stomach upset, gas  This list may not describe all possible side effects. Call your doctor for medical advice about side effects. You may report side effects to FDA at 1-800-FDA-1088.  Where should I keep my medicine?  This medicine is only given in a clinic, doctor's office, or other health care setting and will not be stored at home.  NOTE: This sheet is a summary. It may not cover all possible information. If you have questions about this medicine, talk to your doctor, pharmacist, or health care provider.      2016, Elsevier/Gold Standard. (2012-05-11 12:37:47)

## 2017-04-07 ENCOUNTER — Other Ambulatory Visit: Payer: Self-pay | Admitting: Emergency Medicine

## 2017-04-07 ENCOUNTER — Telehealth: Payer: Self-pay | Admitting: *Deleted

## 2017-04-07 MED ORDER — LISINOPRIL 5 MG PO TABS: 5.0000 mg | ORAL_TABLET | Freq: Every day | ORAL | 3 refills | Status: DC

## 2017-04-07 NOTE — Telephone Encounter (Signed)
Call from pt reporting hypertension med refill was declined by our office, pt states she discussed refills with Dr. Lindi Adie and he agreed to manage BP meds for her.  Message to MD for review.

## 2017-04-23 ENCOUNTER — Other Ambulatory Visit: Payer: Self-pay

## 2017-04-23 ENCOUNTER — Other Ambulatory Visit: Payer: Self-pay | Admitting: Hematology and Oncology

## 2017-04-23 ENCOUNTER — Telehealth: Payer: Self-pay

## 2017-04-23 MED ORDER — ACETAMINOPHEN-CODEINE #3 300-30 MG PO TABS: 1.0000 | ORAL_TABLET | Freq: Four times a day (QID) | ORAL | 0 refills | Status: AC | PRN

## 2017-04-23 NOTE — Telephone Encounter (Signed)
Pt called to request refill of her tylenol 3. Called in refill to her preferred pharmacy.

## 2017-05-12 ENCOUNTER — Telehealth: Payer: Self-pay

## 2017-05-12 MED ORDER — ZOLPIDEM TARTRATE 5 MG PO TABS: ORAL_TABLET | ORAL | 0 refills | Status: DC

## 2017-05-12 NOTE — Telephone Encounter (Signed)
Pt called for ambien refill. Called in.

## 2017-05-16 ENCOUNTER — Other Ambulatory Visit: Payer: Self-pay | Admitting: Hematology and Oncology

## 2017-05-16 NOTE — Telephone Encounter (Signed)
Refill request for Zolpidem in refill errors.  Called pharmacy for clarification.  This nurse unable to match with name, do.b, or phone number with select patient options.   Pharmacist reports "Patient's insuranace has d.o.b as [birth year] but her actual date of birth is.05-08-1941." Refill linked to patient but Refill has been filled on 05-12-2017 per patient request.

## 2017-06-05 ENCOUNTER — Other Ambulatory Visit: Payer: Self-pay | Admitting: *Deleted

## 2017-06-05 DIAGNOSIS — C50919 Malignant neoplasm of unspecified site of unspecified female breast: Secondary | ICD-10-CM

## 2017-06-05 DIAGNOSIS — C7951 Secondary malignant neoplasm of bone: Secondary | ICD-10-CM

## 2017-06-06 ENCOUNTER — Other Ambulatory Visit: Payer: Medicare HMO

## 2017-06-06 ENCOUNTER — Ambulatory Visit: Payer: Medicare HMO | Admitting: Hematology and Oncology

## 2017-06-06 ENCOUNTER — Ambulatory Visit: Payer: Medicare HMO

## 2017-06-06 NOTE — Assessment & Plan Note (Deleted)
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 Hospitalization for SBO Nov 2017  Plan: 1. Continue letrozole 2. scansin April 2018. 3. Xgeva every 3 months along with calcium and vitamin D.She will receive an injection today.  Small bowel obstruction November 2017 required hospitalization and conservative management felt to be related to strictures. Bone pain: Sent a prescription for Tylenol 3 And Ambien  Hypertension:On lisinopril 5 mg daily.  Paranoid behavior: Patient did not undergo scans.

## 2017-06-10 ENCOUNTER — Telehealth: Payer: Self-pay

## 2017-06-10 ENCOUNTER — Other Ambulatory Visit: Payer: Self-pay | Admitting: Emergency Medicine

## 2017-06-10 MED ORDER — ZOLPIDEM TARTRATE 5 MG PO TABS: ORAL_TABLET | ORAL | 0 refills | Status: DC

## 2017-06-10 NOTE — Telephone Encounter (Signed)
Returned pts call to inform her that her medication was called into pharmacy today.

## 2017-06-11 ENCOUNTER — Telehealth: Payer: Self-pay | Admitting: Emergency Medicine

## 2017-07-08 ENCOUNTER — Other Ambulatory Visit: Payer: Self-pay | Admitting: *Deleted

## 2017-07-08 MED ORDER — LETROZOLE 2.5 MG PO TABS: ORAL_TABLET | ORAL | 3 refills | Status: AC

## 2017-07-08 NOTE — Telephone Encounter (Signed)
Received faxed request from pt's pharmacy requesting refills on letrozole and ambien.  Noted pt FTKA on 06/06/2017 with no further scheduled appointments.  Letrozole refilled and note to pharmacy faxed stating need for appointment for refill of ambien.  Request for rescheduling of appointment sent.

## 2017-07-09 ENCOUNTER — Telehealth: Payer: Self-pay | Admitting: Hematology and Oncology

## 2017-07-09 ENCOUNTER — Other Ambulatory Visit: Payer: Self-pay

## 2017-07-09 ENCOUNTER — Telehealth: Payer: Self-pay | Admitting: *Deleted

## 2017-07-09 MED ORDER — ZOLPIDEM TARTRATE 5 MG PO TABS: ORAL_TABLET | ORAL | 0 refills | Status: DC

## 2017-07-09 NOTE — Telephone Encounter (Signed)
"  We put in refill request for both Letrozole and Zolpidem.  Pharmacy received letrozole but still waiting for zolpidem from Dr. Lindi Adie.  What is the hold up?    07-08-2017 refill note shared with call.  "I heard my daughter call informing office I was sick on 06-06-2017."    Will notify [place].  See today's scheduling phone note.  Pending f/u next Thursday, 07-17-2017.  Last [place] f/u was 03-07-2017.

## 2017-07-09 NOTE — Telephone Encounter (Signed)
sw pt to confirm 8/23 appt at 0900 per sch msg

## 2017-07-11 ENCOUNTER — Other Ambulatory Visit: Payer: Self-pay | Admitting: *Deleted

## 2017-07-14 ENCOUNTER — Other Ambulatory Visit: Payer: Self-pay

## 2017-07-14 MED ORDER — ZOLPIDEM TARTRATE 5 MG PO TABS: ORAL_TABLET | ORAL | 0 refills | Status: DC

## 2017-07-15 ENCOUNTER — Ambulatory Visit: Payer: Medicare HMO | Admitting: Hematology and Oncology

## 2017-07-15 ENCOUNTER — Ambulatory Visit: Payer: Medicare HMO

## 2017-07-15 ENCOUNTER — Other Ambulatory Visit: Payer: Medicare HMO

## 2017-07-17 ENCOUNTER — Other Ambulatory Visit: Payer: Medicare HMO

## 2017-07-17 ENCOUNTER — Ambulatory Visit: Payer: Medicare HMO

## 2017-07-17 ENCOUNTER — Ambulatory Visit: Payer: Medicare HMO | Admitting: Hematology and Oncology

## 2017-08-01 ENCOUNTER — Other Ambulatory Visit: Payer: Medicare HMO

## 2017-08-01 ENCOUNTER — Ambulatory Visit: Payer: Medicare HMO | Admitting: Hematology and Oncology

## 2017-08-01 ENCOUNTER — Ambulatory Visit: Payer: Medicare HMO

## 2017-08-01 NOTE — Assessment & Plan Note (Deleted)
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 Hospitalization for SBO Nov 2017  Plan: 1. Continue letrozole 2. scansin April 2018. 3. Xgeva every 3 months along with calcium and vitamin D.She will receive an injection today.  Small bowel obstruction November 2017 required hospitalization and conservative management felt to be related to strictures. Bone pain: Sent a prescription for Tylenol 3 And Ambien  Hypertension:On lisinopril 5 mg daily.  Paranoid behavior:  Patient has not done her scans because of deep suspicion regarding the scanning technology. I'm unable to adequately treat her because I'm uncertain about her disease status.

## 2017-08-14 ENCOUNTER — Telehealth: Payer: Self-pay

## 2017-08-14 NOTE — Telephone Encounter (Signed)
1600 lvm that rx was not filled. 1627 pt called asking for refill again 1637 s/w pt that cannot refill medication until instructed by Dr Lindi Adie.

## 2017-08-14 NOTE — Telephone Encounter (Signed)
Pt called for lisinopril and ambien refills. She has not showed or cancelled her last 3 appts. Last seen 03/07/17. No future appts currently scheduled. This RN did not refill.

## 2017-08-14 NOTE — Telephone Encounter (Signed)
Will follow up with patient

## 2017-08-15 ENCOUNTER — Other Ambulatory Visit: Payer: Self-pay

## 2017-08-15 MED ORDER — LISINOPRIL 5 MG PO TABS: 5.0000 mg | ORAL_TABLET | Freq: Every day | ORAL | 3 refills | Status: AC

## 2017-08-15 MED ORDER — ZOLPIDEM TARTRATE 5 MG PO TABS: ORAL_TABLET | ORAL | 0 refills | Status: DC

## 2017-08-21 ENCOUNTER — Telehealth: Payer: Self-pay | Admitting: Hematology and Oncology

## 2017-08-21 NOTE — Telephone Encounter (Signed)
08/20/17 prescription needs filled. This is scanned under the media tab.

## 2017-08-25 ENCOUNTER — Telehealth: Payer: Self-pay | Admitting: Hematology and Oncology

## 2017-08-25 NOTE — Telephone Encounter (Signed)
08/25/17 prescription refill

## 2017-09-01 ENCOUNTER — Telehealth: Payer: Self-pay | Admitting: Hematology and Oncology

## 2017-09-01 NOTE — Telephone Encounter (Signed)
08/29/17 prescription refill. Prescription scanned in media °

## 2017-09-02 ENCOUNTER — Other Ambulatory Visit: Payer: Self-pay

## 2017-09-05 ENCOUNTER — Telehealth: Payer: Self-pay | Admitting: Hematology and Oncology

## 2017-09-05 ENCOUNTER — Other Ambulatory Visit: Payer: Self-pay

## 2017-09-05 MED ORDER — ZOLPIDEM TARTRATE 5 MG PO TABS: ORAL_TABLET | ORAL | 0 refills | Status: AC

## 2017-09-05 NOTE — Telephone Encounter (Signed)
09/05/17 prescription refill scanned in media °

## 2017-09-05 NOTE — Telephone Encounter (Signed)
Refill request for Ambien called in to [facility]

## 2018-05-17 IMAGING — DX DG ABD PORTABLE 1V
1 series · 1 of 1 positions shown · non-contrast
Comparison: 10/13/2016 .

CLINICAL DATA: NG tube.

EXAM:
PORTABLE ABDOMEN - 1 VIEW

[abdomen kub]
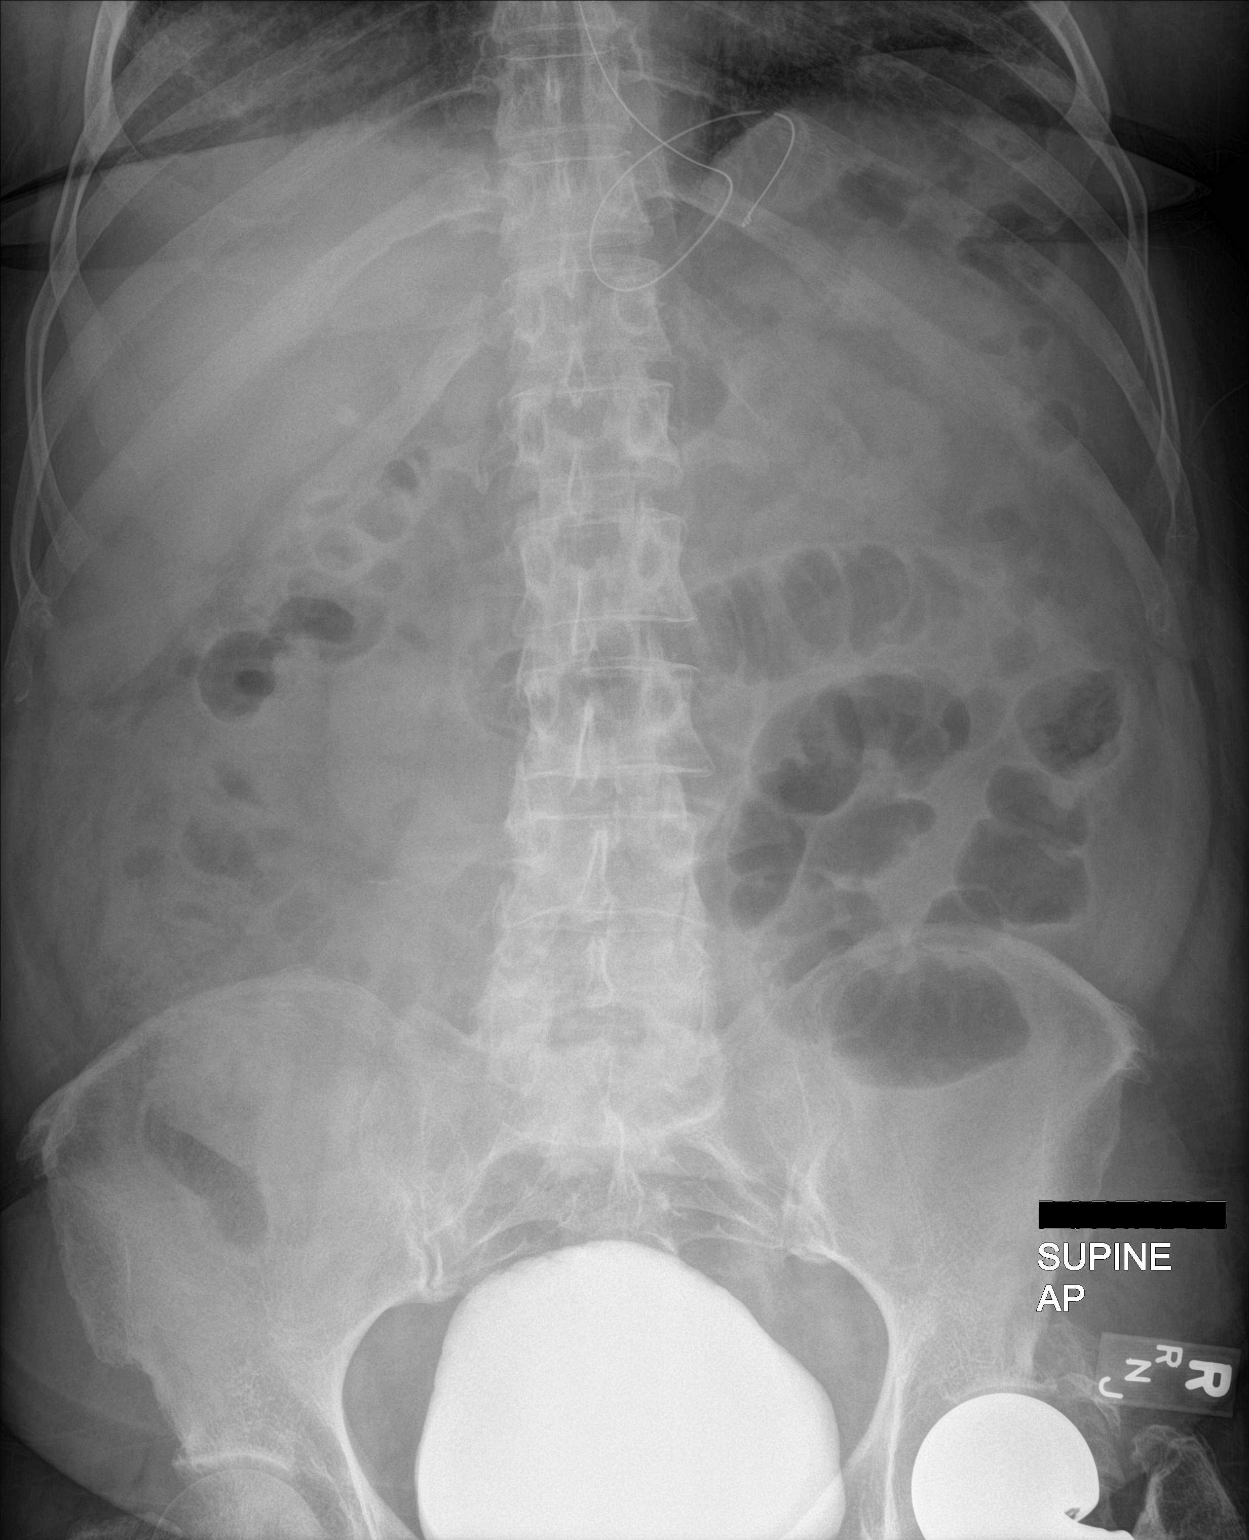

[1 of 1 positions shown; findings below may reference images not displayed]

FINDINGS: NG tube noted in unchanged position coiled at the gastroesophageal
junction. A loop of slightly distended small bowel noted. Colonic
gas pattern is unremarkable. No free air. Contrast is noted in the
right renal collecting system and bladder from prior CT. Left hip
replacement.
IMPRESSION: 1. NG tube in unchanged position coiled at the gastroesophageal
junction.

2. Single slightly prominent loop of small bowel. Follow-up exam
suggested to demonstrate resolution . Colonic gas pattern
unremarkable. No free air.

## 2018-08-17 ENCOUNTER — Inpatient Hospital Stay
Admit: 2018-08-17 | Discharge: 2018-09-03 | Disposition: A | Discharge status: Skilled Nursing Facility | Payer: MEDICARE | Attending: Internal Medicine | Admitting: Internal Medicine

## 2018-08-17 ENCOUNTER — Emergency Department: Admit: 2018-08-18 | Payer: MEDICARE | Primary: Hematology & Oncology

## 2018-08-17 ENCOUNTER — Emergency Department: Admit: 2018-08-17 | Payer: MEDICARE | Primary: Hematology & Oncology

## 2018-08-17 DIAGNOSIS — C7931 Secondary malignant neoplasm of brain: Secondary | ICD-10-CM

## 2018-08-17 MED ORDER — SODIUM CHLORIDE 0.9% BOLUS IV
0.9 % | INTRAVENOUS | Status: AC
Start: 2018-08-17 — End: 2018-08-17
  Administered 2018-08-18: via INTRAVENOUS

## 2018-08-17 NOTE — ED Provider Notes (Addendum)
[facility]  Emergency Department Treatment Report        Patient: Kayla [name] Age: 77 y.o. Sex: female    Date of Birth: [birth year] Admit Date: 08/17/2018 PCP: Henrene Pastor, MD   MRN: [id number]  CSN: [id number]  Attending: Terald Sleeper, MD   Room: [email or web address]  APP: Terald Sleeper, MD     Chief Complaint     Chief Complaint   Patient presents with   ??? Altered mental status       History of Present Illness   77 y.o. female presenting with PMHx of breast cancer presenting with altered mental status.  Patient is here with her daughter, who states her mom has not been acting herself for the past week.  They have recently moved here from [facility], and obtained new providers.  Patient's oncologist is Dr. Brent [facility] who has recently placed patient on oral "cancer medication" per daughter. She was supposed to have the medications changed tomorrow in office, as she was advised to stop medication after 2-3 days of taking it. Her daughter states she noticed she began to become more weak last week, she is no longer able to use her can to walk. She also has become disoriented, and does not know where she is. She states her mouth has been increasingly dry, dehydrated, and more tired. She has given her fluids, Pedialyte, and Gatorade. She had a recent admission in January, for dehydration per her daughters report.     A complete history and review of symptoms could not be performed because of the patients altered mental status.    Review of Systems   A complete history and review of symptoms could not be performed because of the patients altered mental status. Patient was able to express pain to her R flank.     Past Medical/Surgical History   No past medical history on file.  No past surgical history on file.    Social History     Social History     Socioeconomic History   ??? Marital status: DIVORCED     Spouse name: Not on file   ??? Number of children: Not on file    ??? Years of education: Not on file   ??? Highest education level: Not on file   Occupational History   ??? Not on file   Social Needs   ??? Financial resource strain: Not on file   ??? Food insecurity:     Worry: Not on file     Inability: Not on file   ??? Transportation needs:     Medical: Not on file     Non-medical: Not on file   Tobacco Use   ??? Smoking status: Not on file   Substance and Sexual Activity   ??? Alcohol use: Not on file   ??? Drug use: Not on file   ??? Sexual activity: Not on file   Lifestyle   ??? Physical activity:     Days per week: Not on file     Minutes per session: Not on file   ??? Stress: Not on file   Relationships   ??? Social connections:     Talks on phone: Not on file     Gets together: Not on file     Attends religious service: Not on file     Active member of club or organization: Not on file     Attends meetings of clubs or organizations: Not on file  Relationship status: Not on file   ??? Intimate partner violence:     Fear of current or ex partner: Not on file     Emotionally abused: Not on file     Physically abused: Not on file     Forced sexual activity: Not on file   Other Topics Concern   ??? Not on file   Social History Narrative   ??? Not on file       Family History   No family history on file.    Current Medications     Current Facility-Administered Medications   Medication Dose Route Frequency Provider Last Rate Last Dose   ??? piperacillin-tazobactam (ZOSYN) 4.5 g in 0.9% sodium chloride (MBP/ADV) 100 mL MBP  4.5 g IntraVENous ONCE Batichon, Deniece N, PA 200 mL/hr at 08/17/18 2229 4.5 g at 08/17/18 2229   ??? [START ON 08/18/2018] piperacillin-tazobactam (ZOSYN) 3.375 g in 0.9% sodium chloride (MBP/ADV) 100 mL MBP  3.375 g IntraVENous Q8H Batichon, Deniece N, PA       ??? vancomycin (VANCOCIN) 1000 mg in NS 250 ml infusion  1,000 mg IntraVENous ONCE Batichon, Deniece N, PA       ??? [START ON 08/18/2018] vancomycin (VANCOCIN) 750 mg in 0.9% sodium  chloride 250 mL IVPB  750 mg IntraVENous Q12H Batichon, Deniece N, PA       ??? [START ON 08/19/2018] Vancomycin trough due 9/25 @ 0900   Other ONCE Batichon, Deniece N, PA       ??? *Pharmacy dosing vancomycin  1 Each Other Rx Dosing/Monitoring Batichon, Deniece N, PA           Allergies   No Known Allergies    Physical Exam     ED Triage Vitals   Enc Vitals Group      BP 08/17/18 1837 147/75      Pulse (Heart Rate) 08/17/18 1837 (!) 116      Resp Rate 08/17/18 1837 24      Temp 08/17/18 1837 98.3 ??F (36.8 ??C)      Temp src --       O2 Sat (%) 08/17/18 1837 98 %      Weight 08/17/18 1829 122 lb      Height 08/17/18 1829 _0       Head Circumference --       Peak Flow --       Pain Score --       Pain Loc --       Pain Edu? --       Excl. in GC? --        CONSTITUTIONAL: Laying in bed. She looks dry, her voice is horse.   HEAD: Normocephalic and atraumatic.  EYES: PERRL.  EOM intact. Visual fields tested and intact bilaterally.  ENT: Posterior pharynx clear. Uvula midline no erythema or exudate.   NECK: Supple with no evidence of meningismus.  Non-tender along midline with no step-offs.    CARDIOVASCULAR: Normal S1, S2; no murmurs, rubs, or gallops.  Radial, femoral, and pedal pulses are present and symmetric. There is brisk capillary refill.  RESPIRATORY: Normal chest excursion with respiration.  Breath sounds clear and equal bilaterally.  No wheezes, rhonchi, or rales.   CHEST:  No evidence of trauma or deformity.  Non-tender to palpation.  No crepitus or paradoxical movements.  Chest excursion is normal.  GI: Bowel sounds present. Non-distended.  Non-tender to deep palpation throughout abdomen.  No guarding.  Negative Murphy???s sign.  No palpable organomegaly. There are no abdominal bruits.  There is no palpable pulsatile abdominal mass.  BACK:  No evidence of trauma, pain or deformity.  Non-tender to palpation along entirety of midline. Tenderness to R side   EXT: I examined all major joints and long bones.There are no deformities noted in extremities. No pain with hip motion  There is full ROM in all extremity joints. There is no bony or joint tenderness to palpation.  Pulses are equal bilaterally.   SKIN: Normal for age and race; warm; dry; good turgor; no apparent lesions or exudate.  PERIPHERAL VASCULAR: No calf tenderness, erythema, or size asymmetry. Brisk cap refill.   NEURO: Patient's altered mental status limits exam. Patient can not follow commands    PSYCHOLOGICAL: Oriented to person only.      Impression and Management Plan   77 y.o.female with altered mental status.  Patient is now barely responsive, however she is able to answer some question by the point.  Will obtain CXR CT Ab/Pelvis, CT Head, Lactic acid, troponin, ammonia, CBC, CMP, and reassess.     Nursing notes were reviewed. Past medical history reviewed. This is a new problem for this patient. Differential Diagnosis: sepsis, toxicity, hypoglycemia, malignancy, pneumonia.   Initial interventions given  2 L NS IV, Zosyn, Vancomycin.      Diagnostic Studies   Lab:   Results for orders placed or performed during the hospital encounter of 96/04/54   METABOLIC PANEL, COMPREHENSIVE   Result Value Ref Range    Sodium 140 136 - 145 mEq/L    Potassium 3.3 (L) 3.5 - 5.1 mEq/L    Chloride 101 98 - 107 mEq/L    CO2 31 21 - 32 mEq/L    Glucose 82 74 - 106 mg/dl    BUN 11 7 - 25 mg/dl    Creatinine 0.6 0.6 - 1.3 mg/dl    GFR est AA >60.0      GFR est non-AA >60      Calcium 10.9 (H) 8.5 - 10.1 mg/dl    AST (SGOT) 505 (H) 15 - 37 U/L    ALT (SGPT) 61 12 - 78 U/L    Alk. phosphatase 836 (H) 45 - 117 U/L    Bilirubin, total 1.9 (H) 0.2 - 1.0 mg/dl    Protein, total 7.2 6.4 - 8.2 gm/dl    Albumin 2.5 (L) 3.4 - 5.0 gm/dl    Anion gap 8 5 - 15 mmol/L   CBC WITH AUTOMATED DIFF   Result Value Ref Range    WBC 6.3 4.0 - 11.0 1000/mm3    RBC 3.77 3.60 - 5.20 M/uL    HGB 10.8 (L) 13.0 - 17.2 gm/dl     HCT 33.9 (L) 37.0 - 50.0 %    MCV 89.9 80.0 - 98.0 fL    MCH 28.6 25.4 - 34.6 pg    MCHC 31.9 30.0 - 36.0 gm/dl    PLATELET 272 140 - 450 1000/mm3    MPV 10.4 (H) 6.0 - 10.0 fL    RDW-SD 56.4 (H) 36.4 - 46.3      NRBC 0 0 - 0      IMMATURE GRANULOCYTES 0.8 0.0 - 3.0 %    NEUTROPHILS 67.7 (H) 34 - 64 %    LYMPHOCYTES 15.6 (L) 28 - 48 %    MONOCYTES 13.0 1 - 13 %    EOSINOPHILS 2.1 0 - 5 %    BASOPHILS 0.8 0 - 3 %  LACTIC ACID   Result Value Ref Range    Lactic Acid 3.2 (H) 0.4 - 2.0 mmol/L   TROPONIN I   Result Value Ref Range    Troponin-I <0.015 0.000 - 0.045 ng/ml   AMMONIA   Result Value Ref Range    Ammonia 37 (H) 25 - 32 umol/L   NT-PRO BNP   Result Value Ref Range    NT pro-BNP 151.0 (H) 0.0 - 125.0 pg/ml   POC CHEM8   Result Value Ref Range    Sodium 139 136 - 145 mEq/L    Potassium 3.4 (L) 3.5 - 4.9 mEq/L    Chloride 99 98 - 107 mEq/L    CO2, TOTAL 30 21 - 32 mmol/L    Glucose 87 74 - 106 mg/dL    BUN 9 7 - 25 mg/dl    Creatinine 0.5 (L) 0.6 - 1.3 mg/dl    HCT 36 (L) 38 - 45 %    HGB 12.2 (L) 12.4 - 17.2 gm/dl    CALCIUM,IONIZED 5.20 4.40 - 5.40 mg/dL   POC URINE MACROSCOPIC   Result Value Ref Range    Glucose Negative NEGATIVE,Negative mg/dl    Bilirubin Small (A) NEGATIVE,Negative      Ketone Trace (A) NEGATIVE,Negative mg/dl    Specific gravity 1.025 1.005 - 1.030      Blood Negative NEGATIVE,Negative      pH (UA) 5.5 5 - 9      Protein Trace (A) NEGATIVE,Negative mg/dl    Urobilinogen >=8.0 0.0 - 1.0 EU/dl    Nitrites Negative NEGATIVE,Negative      Leukocyte Esterase Negative NEGATIVE,Negative      Color Orange      Appearance Clear         Imaging:    Xr Chest Sngl V    Result Date: 08/17/2018  Indication: Short of breath Frontal view chest Heart mildly enlarged. Shallow inspiration with interstitial and patchy alveolar densities throughout the lungs. Anterior cervical fusion at 3 adjacent lower cervical levels. Degenerative narrowing and sclerosis bilateral humeral joint. This rotated to the right.      IMPRESSION: Edema versus multifocal pneumonia or chronic changes bilaterally. Cardiomegaly. Postsurgical changes cervical spine.     Ct Head Wo Cont    Result Date: 08/17/2018  DICOM format image data is available to nonaffiliated external healthcare facilities or entities on a secure, media free, reciprocally searchable basis with patient authorization for 12 months following the date of the study. Indication: Altered mental status Technique:  5 millimeter axial images without contrast were obtained through the brain. Comparison: none Findings:   There is generalized moderate cerebral atrophy.  Low attenuation surrounds the lateral ventricles. There is a rounded 16 x 14 mm mass in the right thalamus surrounding low-density edema. The mass is hyperdense peripherally and low density centrally. There is also focal low-density in the right parietal white matter suggesting vasogenic edema. Focal low-density seen more laterally in the right parietal cortex measuring 2.2 cm diameter. Apparent 2.6 cm x 2.2 cm mass is seen in the right cerebellum with surrounding low-density suggesting vasogenic edema. Asymmetric 11 mm low-density irregular lytic lesion is seen in the diploic space of the right parietal bone. No hemorrhage seen. Sella, pineal, craniocervical junction, orbits, paranasal sinuses, and temporal bones are unremarkable.      Impression: cerebral atrophy and nonspecific white matter changes not unusual for age; multiple lesions as described in brain and calvarium worrisome for metastatic disease on this noncontrast study. Contrast  MRI suggested for further evaluation.     Cta Chest W Or W Wo Cont    Result Date: 08/17/2018  DICOM format image data is available to nonaffiliated external healthcare facilities or entities on a secure, media free, reciprocally searchable basis with patient authorization for 12 months following the date of the study. Indication: Altered mental status Technique: 3 millimeter axial  images with IV contrast obtained from lung apices to bases. 3-D MIP CTA images obtained. Comparison: Frontal view chest 08/17/2018 Findings:  Mediastinum: No aortic dissection or pulmonary embolus. Calcified aortic arch and mild coronary calcifications. 10 mm low-density focus right thyroid lobe. Mild cardiomegaly. Small bilateral pleural effusions. Lungs: Numerous nodules of varying sizes throughout the lungs largest of which left lower lobe measures 18 x 19 mm. Focal pleural thickening adjacent to rib destruction right lower lobe posterolaterally. Bones/ chest wall: Extensive lytic lesions throughout the skeletal system of the thorax including sternum, spine, and ribs. High-grade compression fractures T3 and T4 with mild retropulsion. Anterior cervical fusion. Multiple masses are seen throughout the right breast the largest of which is partially imaged laterally at 6.8 x 3.5 cm; there is overlying right breast skin thickening. Asymmetric upper normal size nodes are seen in the right axilla. Below the diaphragm liver is heterogeneously low in density.     Impression: 1. No evidence of pulmonary embolus or aortic dissection. 2. 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 3. Indeterminate lesion right thyroid lobe which could be evaluated with ultrasound. 4. Cardiomegaly. 5. Bilateral small pleural effusions. 6. Multiple lesions throughout lungs and bones consistent with metastatic disease possibly from breast primary with asymmetric multi centric lesions throughout the right breast and overlying skin thickening with asymmetric upper normal size right axillary nodes. 7. Compression fractures T3 and T4 with retropulsion. 8. Heterogeneous liver. Metastatic disease not excluded.     Ct Abd Pelv W Cont    Result Date: 08/17/2018  DICOM format image data is available to nonaffiliated external healthcare facilities or entities on a secure, media free, reciprocally searchable  basis with patient authorization for 12 months following the date of the study. TECHNIQUE: CT of the abdomen and pelvis WITH intravenous contrast.  The abdomen and pelvis were scanned utilizing a multidetector helical scanner from the diaphragm to the lesser trochanter without the oral administration of barium.  Coronal and sagittal reformations were obtained. COMPARISON: none INDICATION: Altered mental status DISCUSSION: HEPATOBILIARY: Multiple low-density ill-defined lesions throughout the liver with mild hepatomegaly. Gallbladder mildly thick-walled but otherwise unremarkable. SPLEEN: No splenomegaly or focal lesion. PANCREAS: Atrophic. ADRENALS: No adrenal nodules. KIDNEYS/URETERS: 17 mm rounded low-density right lower renal pole cystic cyst without hydronephrosis. PELVIC ORGANS/BLADDER: Uterus atrophic or removed. Extensive artifact in pelvis due to right hip replacement. PERITONEUM / RETROPERITONEUM: No free air or fluid. LYMPH NODES: No lymphadenopathy. VESSELS: Heavy calcification in nondilated aorta and iliac vessels. GI TRACT: No distention or wall thickening. No evidence of appendicitis. Large amount retained fecal material in colon and rectum. BONES AND SOFT TISSUES: Extensive lytic destruction of the bony structures throughout spine and pelvis with left hip replacement. Multiple compression fractures of vertebral bodies moderate grade at L3 and low-grade at L2 and L4 without significant retropulsion. Small fracture right superior acetabular margin laterally.     IMPRESSION: 1. 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 Compression fractures L2 through L4. Small acute fracture suspected at superior lateral right acetabular margin. 2. Indeterminate right renal lesion likely represent cyst which could be confirmed with  ultrasound. 3. Left hip replacement. 4. Uterus atrophic or removed. 5. Atherosclerotic vascular disease. 6. Constipation.       ED Course/MDM    Patient given Vancomycin and Zosyn, and fluids. CXR per radiologist read shows possible pneumonia vs chronic changes.  Antibiotics were ordered.  Lactic acid and ammonia increased. WBC, troponin, and urine unremarkable. Patient seen by Dr. Pamella Pert. Awaiting CT results.     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  Chest CTA shows bilateral pleural effusions.  Patient's daughter was informed of the results.  Patient continues with tachycardia despite treatment.  I will discuss admission with hospitalist.    10:56 PM D/w Dr. Rosalene Billings who will admit to stepdown unit.    Final Diagnosis       ICD-10-CM ICD-9-CM   1. Malignant neoplasm of breast, stage 4, unspecified laterality (HCC) C50.919 174.9   2. Altered mental status, unspecified altered mental status type R41.82 780.97   3. Pleural effusion J90 511.9   4. Dehydration E86.0 276.51   5. Brain mass G93.9 348.9   6. 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 Lactic acidosis E87.2 276.2       Disposition   Admission.    Deniece Batichon, PA-C  August 17, 2018    The patient was personally evaluated by myself and Dr. Terald Sleeper, MD who agrees with the above assessment and plan.    My signature above authenticates this document and my orders, the final    diagnosis (es), discharge prescription (s), and instructions in the Epic    record.  If you have any questions please contact [phone].     Nursing notes have been reviewed by the physician/ advanced practice    Clinician.

## 2018-08-17 NOTE — Progress Notes (Signed)
[facility] Dosing Services: Vancomycin    Consult for Vancomycin Dosing by Pharmacy by Rexanne Mano  Consult provided for this 77 y.o. year old female , for indication of sepsis.  Day of Therapy 0    Ht Readings from Last 1 Encounters:   08/17/18 167.6 cm (66")        Wt Readings from Last 1 Encounters:   08/17/18 55.3 kg (122 lb)        Previous Regimen    Last Level    Other Current Antibiotics Zosyn   Significant Cultures    Serum Creatinine Lab Results   Component Value Date/Time    Creatinine 0.6 08/17/2018 08:15 PM    Creatinine 0.5 (L) 08/17/2018 08:15 PM        Creatinine Clearance Estimated Creatinine Clearance: 71.8 mL/min (based on SCr of 0.6 mg/dL).   BUN Lab Results   Component Value Date/Time    BUN 11 08/17/2018 08:15 PM    BUN 9 08/17/2018 08:15 PM        WBC Lab Results   Component Value Date/Time    WBC 6.3 08/17/2018 08:15 PM        H/H Lab Results   Component Value Date/Time    HGB 10.8 (L) 08/17/2018 08:15 PM    HGB 12.2 (L) 08/17/2018 08:15 PM        Platelets Lab Results   Component Value Date/Time    PLATELET 272 08/17/2018 08:15 PM        Temp 98.3 ??F (36.8 ??C)     Start Vancomycin therapy, with loading dose of 1000 (mg) on 9/24 @ 2200  Follow with maintenance dose of 750(mg) every 12 hours    Dose calculated to approximate a therapeutic trough of 15-20 mcg/mL.      Will draw a vancomycin trough prior to the fourth dose on 9/25 @ 0900     Thank you,  Marya Fossa, PharmD

## 2018-08-17 NOTE — ED Triage Notes (Signed)
Slow decline in mental status started a couple weeks ago at which time she was started on a new oral chemo.  This week she has declined to inability to walk and only alert to person.

## 2018-08-17 NOTE — H&P (Signed)
Medicine History and Physical    Patient: Kayla [name] Age: 77 y.o. 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 Altered mental status         HPI:   Kayla [name] is a 77 y.o. year old female who presents with decreased PO intake, generalized weakness since being placed on oral chemotherapy last week. Today, patient noted to be confused, lethargic. In ED, Patient awake and oriented to person however not time or place. Patient on presentation, tachycardic, and appeared dehydrated.   In ED, Vancomycin and Zosyn initiated.       Review of Systems - 12 Point ROS -ve except what is noted in the HPI.     Past Medical History:  No past medical history on file.    Past Surgical History:  No past surgical history on file.    Family History:  No family history on file.    Social History:  Social History     Socioeconomic History   ??? Marital status: DIVORCED     Spouse name: Not on file   ??? Number of children: Not on file   ??? Years of education: Not on file   ??? Highest education level: Not on file       Home Medications:  Prior to Admission medications    Not on File       Allergies:  No Known Allergies      Physical Exam:     Visit Vitals  BP 129/72   Pulse (!) 105   Temp 98.3 ??F (36.8 ??C)   Resp 13   Ht '5\' 6"'$  (1.676 m)   Wt 55.3 kg (122 lb)   SpO2 100%   BMI 19.69 kg/m??       Physical Exam:   General appearance: alert, cooperative, no distress, appears stated age  Head: Normocephalic, without obvious abnormality, atraumatic  Neck: supple, trachea midline  Lungs: clear to auscultation bilaterally  Heart: regular rate and rhythm, S1, S2 normal, no murmur, click, rub or gallop  Abdomen: soft, non-tender. Bowel sounds normal. No masses,  no organomegaly  Extremities: extremities normal, atraumatic, no cyanosis or edema  Skin: dry skin, dry MM  Neurologic: Grossly normal      Intake and Output:  Current Shift:  09/23 1901 - 09/24 0700  In: 1000 [I.V.:1000]  Out: -   Last three shifts:  No intake/output data recorded.    Lab/Data Reviewed:  Lab:   Recent Results (from the past 12 hour(s))   METABOLIC PANEL, COMPREHENSIVE    Collection Time: 08/17/18  8:15 PM   Result Value Ref Range    Sodium 140 136 - 145 mEq/L    Potassium 3.3 (L) 3.5 - 5.1 mEq/L  Chloride 101 98 - 107 mEq/L    CO2 31 21 - 32 mEq/L    Glucose 82 74 - 106 mg/dl    BUN 11 7 - 25 mg/dl    Creatinine 0.6 0.6 - 1.3 mg/dl    GFR est AA >60.0      GFR est non-AA >60      Calcium 10.9 (H) 8.5 - 10.1 mg/dl    AST (SGOT) 505 (H) 15 - 37 U/L    ALT (SGPT) 61 12 - 78 U/L    Alk. phosphatase 836 (H) 45 - 117 U/L    Bilirubin, total 1.9 (H) 0.2 - 1.0 mg/dl    Protein, total 7.2 6.4 - 8.2 gm/dl    Albumin 2.5 (L) 3.4 - 5.0 gm/dl    Anion gap 8 5 - 15 mmol/L   CBC WITH AUTOMATED DIFF    Collection Time: 08/17/18  8:15 PM   Result Value Ref Range    WBC 6.3 4.0 - 11.0 1000/mm3    RBC 3.77 3.60 - 5.20 M/uL    HGB 10.8 (L) 13.0 - 17.2 gm/dl    HCT 33.9 (L) 37.0 - 50.0 %    MCV 89.9 80.0 - 98.0 fL    MCH 28.6 25.4 - 34.6 pg    MCHC 31.9 30.0 - 36.0 gm/dl    PLATELET 272 140 - 450 1000/mm3    MPV 10.4 (H) 6.0 - 10.0 fL    RDW-SD 56.4 (H) 36.4 - 46.3      NRBC 0 0 - 0      IMMATURE GRANULOCYTES 0.8 0.0 - 3.0 %    NEUTROPHILS 67.7 (H) 34 - 64 %    LYMPHOCYTES 15.6 (L) 28 - 48 %    MONOCYTES 13.0 1 - 13 %    EOSINOPHILS 2.1 0 - 5 %    BASOPHILS 0.8 0 - 3 %    LACTIC ACID    Collection Time: 08/17/18  8:15 PM   Result Value Ref Range    Lactic Acid 3.2 (H) 0.4 - 2.0 mmol/L   TROPONIN I    Collection Time: 08/17/18  8:15 PM   Result Value Ref Range    Troponin-I <0.015 0.000 - 0.045 ng/ml   AMMONIA    Collection Time: 08/17/18  8:15 PM   Result Value Ref Range    Ammonia 37 (H) 25 - 32 umol/L   POC CHEM8    Collection Time: 08/17/18  8:15 PM   Result Value Ref Range    Sodium 139 136 - 145 mEq/L    Potassium 3.4 (L) 3.5 - 4.9 mEq/L    Chloride 99 98 - 107 mEq/L    CO2, TOTAL 30 21 - 32 mmol/L    Glucose 87 74 - 106 mg/dL    BUN 9 7 - 25 mg/dl    Creatinine 0.5 (L) 0.6 - 1.3 mg/dl    HCT 36 (L) 38 - 45 %    HGB 12.2 (L) 12.4 - 17.2 gm/dl    CALCIUM,IONIZED 5.20 4.40 - 5.40 mg/dL   NT-PRO BNP    Collection Time: 08/17/18  8:15 PM   Result Value Ref Range    NT pro-BNP 151.0 (H) 0.0 - 125.0 pg/ml   POC URINE MACROSCOPIC    Collection Time: 08/17/18  8:37 PM   Result Value Ref Range    Glucose Negative NEGATIVE,Negative mg/dl    Bilirubin Small (A) NEGATIVE,Negative      Ketone Trace (A) NEGATIVE,Negative  mg/dl    Specific gravity 1.025 1.005 - 1.030      Blood Negative NEGATIVE,Negative      pH (UA) 5.5 5 - 9      Protein Trace (A) NEGATIVE,Negative mg/dl    Urobilinogen >=8.0 0.0 - 1.0 EU/dl    Nitrites Negative NEGATIVE,Negative      Leukocyte Esterase Negative NEGATIVE,Negative      Color Orange      Appearance Clear         Imaging:    Xr Chest Sngl V    Result Date: 08/17/2018  Indication: Short of breath Frontal view chest Heart mildly enlarged. Shallow inspiration with interstitial and patchy alveolar densities throughout the lungs. Anterior cervical fusion at 3 adjacent lower cervical levels. Degenerative narrowing and sclerosis bilateral humeral joint. This rotated to the right.     IMPRESSION: Edema versus multifocal pneumonia or chronic changes bilaterally. Cardiomegaly. Postsurgical changes cervical spine.     Ct Head Wo Cont    Result Date: 08/17/2018   DICOM format image data is available to nonaffiliated external healthcare facilities or entities on a secure, media free, reciprocally searchable basis with patient authorization for 12 months following the date of the study. Indication: Altered mental status Technique:  5 millimeter axial images without contrast were obtained through the brain. Comparison: none Findings:   There is generalized moderate cerebral atrophy.  Low attenuation surrounds the lateral ventricles. There is a rounded 16 x 14 mm mass in the right thalamus surrounding low-density edema. The mass is hyperdense peripherally and low density centrally. There is also focal low-density in the right parietal white matter suggesting vasogenic edema. Focal low-density seen more laterally in the right parietal cortex measuring 2.2 cm diameter. Apparent 2.6 cm x 2.2 cm mass is seen in the right cerebellum with surrounding low-density suggesting vasogenic edema. Asymmetric 11 mm low-density irregular lytic lesion is seen in the diploic space of the right parietal bone. No hemorrhage seen. Sella, pineal, craniocervical junction, orbits, paranasal sinuses, and temporal bones are unremarkable.      Impression: cerebral atrophy and nonspecific white matter changes not unusual for age; multiple lesions as described in brain and calvarium worrisome for metastatic disease on this noncontrast study. Contrast MRI suggested for further evaluation.     Cta Chest W Or W Wo Cont    Result Date: 08/17/2018  DICOM format image data is available to nonaffiliated external healthcare facilities or entities on a secure, media free, reciprocally searchable basis with patient authorization for 12 months following the date of the study. Indication: Altered mental status Technique: 3 millimeter axial images with IV contrast obtained from lung apices to bases. 3-D MIP CTA images obtained. Comparison: Frontal view chest 08/17/2018 Findings:   Mediastinum: No aortic dissection or pulmonary embolus. Calcified aortic arch and mild coronary calcifications. 10 mm low-density focus right thyroid lobe. Mild cardiomegaly. Small bilateral pleural effusions. Lungs: Numerous nodules of varying sizes throughout the lungs largest of which left lower lobe measures 18 x 19 mm. Focal pleural thickening adjacent to rib destruction right lower lobe posterolaterally. Bones/ chest wall: Extensive lytic lesions throughout the skeletal system of the thorax including sternum, spine, and ribs. High-grade compression fractures T3 and T4 with mild retropulsion. Anterior cervical fusion. Multiple masses are seen throughout the right breast the largest of which is partially imaged laterally at 6.8 x 3.5 cm; there is overlying right breast skin thickening. Asymmetric upper normal size nodes are seen in the right axilla. Below the diaphragm  liver is heterogeneously low in density.     Impression: 1. No evidence of pulmonary embolus or aortic dissection. 2. 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 3. Indeterminate lesion right thyroid lobe which could be evaluated with ultrasound. 4. Cardiomegaly. 5. Bilateral small pleural effusions. 6. Multiple lesions throughout lungs and bones consistent with metastatic disease possibly from breast primary with asymmetric multi centric lesions throughout the right breast and overlying skin thickening with asymmetric upper normal size right axillary nodes. 7. Compression fractures T3 and T4 with retropulsion. 8. Heterogeneous liver. Metastatic disease not excluded.     Ct Abd Pelv W Cont    Result Date: 08/17/2018  DICOM format image data is available to nonaffiliated external healthcare facilities or entities on a secure, media free, reciprocally searchable basis with patient authorization for 12 months following the date of the study. TECHNIQUE: CT of the abdomen and pelvis WITH intravenous contrast.   The abdomen and pelvis were scanned utilizing a multidetector helical scanner from the diaphragm to the lesser trochanter without the oral administration of barium.  Coronal and sagittal reformations were obtained. COMPARISON: none INDICATION: Altered mental status DISCUSSION: HEPATOBILIARY: Multiple low-density ill-defined lesions throughout the liver with mild hepatomegaly. Gallbladder mildly thick-walled but otherwise unremarkable. SPLEEN: No splenomegaly or focal lesion. PANCREAS: Atrophic. ADRENALS: No adrenal nodules. KIDNEYS/URETERS: 17 mm rounded low-density right lower renal pole cystic cyst without hydronephrosis. PELVIC ORGANS/BLADDER: Uterus atrophic or removed. Extensive artifact in pelvis due to right hip replacement. PERITONEUM / RETROPERITONEUM: No free air or fluid. LYMPH NODES: No lymphadenopathy. VESSELS: Heavy calcification in nondilated aorta and iliac vessels. GI TRACT: No distention or wall thickening. No evidence of appendicitis. Large amount retained fecal material in colon and rectum. BONES AND SOFT TISSUES: Extensive lytic destruction of the bony structures throughout spine and pelvis with left hip replacement. Multiple compression fractures of vertebral bodies moderate grade at L3 and low-grade at L2 and L4 without significant retropulsion. Small fracture right superior acetabular margin laterally.     IMPRESSION: 1. 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 Compression fractures L2 through L4. Small acute fracture suspected at superior lateral right acetabular margin. 2. Indeterminate right renal lesion likely represent cyst which could be confirmed with ultrasound. 3. Left hip replacement. 4. Uterus atrophic or removed. 5. Atherosclerotic vascular disease. 6. Constipation.       Rosalene Billings, MD  August 17, 2018

## 2018-08-17 NOTE — ED Provider Notes (Signed)
[facility]  Emergency Department Treatment Report        Patient: Kayla [name] Age: 77 y.o. Sex: female    Date of Birth: 12/14/1940 Admit Date: 08/17/2018 PCP: Henrene Pastor, MD   MRN: [id number]  CSN: [id number]  Attending: Terald Sleeper, MD   Room: [facility]  APP: Terald Sleeper, MD     Chief Complaint     Chief Complaint   Patient presents with   ??? Altered mental status       History of Present Illness   77 y.o. female presenting with PMHx of breast cancer presenting with altered mental status.  Patient is here with her daughter, who states her mom has not been acting herself for the past week.  They have recently moved here from [facility], and obtained new providers.  Patient's oncologist is Dr. Brent [facility] who has recently placed patient on oral "cancer medication" per daughter. She was supposed to have the medications changed tomorrow in office, as she was advised to stop medication after 2-3 days of taking it. Her daughter states she noticed she began to become more weak last week, she is no longer able to use her can to walk. She also has become disoriented, and does not know where she is. She states her mouth has been increasingly dry, dehydrated, and more tired. She has given her fluids, Pedialyte, and Gatorade. She had a recent admission in January, for dehydration per her daughters report.     A complete history and review of symptoms could not be performed because of the patients altered mental status.    Review of Systems   A complete history and review of symptoms could not be performed because of the patients altered mental status. Patient was able to express pain to her R flank.     Past Medical/Surgical History   No past medical history on file.  No past surgical history on file.    Social History     Social History     Socioeconomic History   ??? Marital status: DIVORCED     Spouse name: Not on file   ??? Number of children: Not on file   ??? Years of  education: Not on file   ??? Highest education level: Not on file   Occupational History   ??? Not on file   Social Needs   ??? Financial resource strain: Not on file   ??? Food insecurity:     Worry: Not on file     Inability: Not on file   ??? Transportation needs:     Medical: Not on file     Non-medical: Not on file   Tobacco Use   ??? Smoking status: Not on file   Substance and Sexual Activity   ??? Alcohol use: Not on file   ??? Drug use: Not on file   ??? Sexual activity: Not on file   Lifestyle   ??? Physical activity:     Days per week: Not on file     Minutes per session: Not on file   ??? Stress: Not on file   Relationships   ??? Social connections:     Talks on phone: Not on file     Gets together: Not on file     Attends religious service: Not on file     Active member of club or organization: Not on file     Attends meetings of clubs or organizations: Not on file  Relationship status: Not on file   ??? Intimate partner violence:     Fear of current or ex partner: Not on file     Emotionally abused: Not on file     Physically abused: Not on file     Forced sexual activity: Not on file   Other Topics Concern   ??? Not on file   Social History Narrative   ??? Not on file       Family History   No family history on file.    Current Medications     Current Facility-Administered Medications   Medication Dose Route Frequency Provider Last Rate Last Dose   ??? piperacillin-tazobactam (ZOSYN) 4.5 g in 0.9% sodium chloride (MBP/ADV) 100 mL MBP  4.5 g IntraVENous ONCE Batichon, Deniece N, PA 200 mL/hr at 08/17/18 2229 4.5 g at 08/17/18 2229   ??? [START ON 08/18/2018] piperacillin-tazobactam (ZOSYN) 3.375 g in 0.9% sodium chloride (MBP/ADV) 100 mL MBP  3.375 g IntraVENous Q8H Batichon, Deniece N, PA       ??? vancomycin (VANCOCIN) 1000 mg in NS 250 ml infusion  1,000 mg IntraVENous ONCE Batichon, Deniece N, PA       ??? [START ON 08/18/2018] vancomycin (VANCOCIN) 750 mg in 0.9% sodium chloride 250 mL IVPB  750 mg IntraVENous Q12H Batichon, Deniece  N, PA       ??? [START ON 08/19/2018] Vancomycin trough due 9/25 @ 0900   Other ONCE Batichon, Deniece N, PA       ??? *Pharmacy dosing vancomycin  1 Each Other Rx Dosing/Monitoring Batichon, Deniece N, PA           Allergies   No Known Allergies    Physical Exam     ED Triage Vitals   Enc Vitals Group      BP 08/17/18 1837 147/75      Pulse (Heart Rate) 08/17/18 1837 (!) 116      Resp Rate 08/17/18 1837 24      Temp 08/17/18 1837 98.3 ??F (36.8 ??C)      Temp src --       O2 Sat (%) 08/17/18 1837 98 %      Weight 08/17/18 1829 122 lb      Height 08/17/18 1829 '5\' 6"'$       Head Circumference --       Peak Flow --       Pain Score --       Pain Loc --       Pain Edu? --       Excl. in GC? --        CONSTITUTIONAL: Laying in bed. She looks dry, her voice is horse.   HEAD: Normocephalic and atraumatic.  EYES: PERRL.  EOM intact. Visual fields tested and intact bilaterally.  ENT: Posterior pharynx clear. Uvula midline no erythema or exudate.   NECK: Supple with no evidence of meningismus.  Non-tender along midline with no step-offs.    CARDIOVASCULAR: Normal S1, S2; no murmurs, rubs, or gallops.  Radial, femoral, and pedal pulses are present and symmetric. There is brisk capillary refill.  RESPIRATORY: Normal chest excursion with respiration.  Breath sounds clear and equal bilaterally.  No wheezes, rhonchi, or rales.   CHEST:  No evidence of trauma or deformity.  Non-tender to palpation.  No crepitus or paradoxical movements.  Chest excursion is normal.  GI: Bowel sounds present. Non-distended.  Non-tender to deep palpation throughout abdomen.  No guarding.  Negative Murphy???s sign.  No palpable organomegaly. There are no abdominal bruits.  There is no palpable pulsatile abdominal mass.  BACK:  No evidence of trauma, pain or deformity.  Non-tender to palpation along entirety of midline. Tenderness to R side  EXT: I examined all major joints and long bones.There are no deformities noted in extremities. No pain with hip motion   There is full ROM in all extremity joints. There is no bony or joint tenderness to palpation.  Pulses are equal bilaterally.   SKIN: Normal for age and race; warm; dry; good turgor; no apparent lesions or exudate.  PERIPHERAL VASCULAR: No calf tenderness, erythema, or size asymmetry. Brisk cap refill.   NEURO: Patient's altered mental status limits exam. Patient can not follow commands    PSYCHOLOGICAL: Oriented to person only.      Impression and Management Plan   77 y.o.female with altered mental status.  Patient is now barely responsive, however she is able to answer some question by the point.  Will obtain CXR CT Ab/Pelvis, CT Head, Lactic acid, troponin, ammonia, CBC, CMP, and reassess.     Nursing notes were reviewed. Past medical history reviewed. This is a new problem for this patient. Differential Diagnosis: sepsis, toxicity, hypoglycemia, malignancy, pneumonia.   Initial interventions given  2 L NS IV, Zosyn, Vancomycin.      Diagnostic Studies   Lab:   Results for orders placed or performed during the hospital encounter of 57/32/20   METABOLIC PANEL, COMPREHENSIVE   Result Value Ref Range    Sodium 140 136 - 145 mEq/L    Potassium 3.3 (L) 3.5 - 5.1 mEq/L    Chloride 101 98 - 107 mEq/L    CO2 31 21 - 32 mEq/L    Glucose 82 74 - 106 mg/dl    BUN 11 7 - 25 mg/dl    Creatinine 0.6 0.6 - 1.3 mg/dl    GFR est AA >60.0      GFR est non-AA >60      Calcium 10.9 (H) 8.5 - 10.1 mg/dl    AST (SGOT) 505 (H) 15 - 37 U/L    ALT (SGPT) 61 12 - 78 U/L    Alk. phosphatase 836 (H) 45 - 117 U/L    Bilirubin, total 1.9 (H) 0.2 - 1.0 mg/dl    Protein, total 7.2 6.4 - 8.2 gm/dl    Albumin 2.5 (L) 3.4 - 5.0 gm/dl    Anion gap 8 5 - 15 mmol/L   CBC WITH AUTOMATED DIFF   Result Value Ref Range    WBC 6.3 4.0 - 11.0 1000/mm3    RBC 3.77 3.60 - 5.20 M/uL    HGB 10.8 (L) 13.0 - 17.2 gm/dl    HCT 33.9 (L) 37.0 - 50.0 %    MCV 89.9 80.0 - 98.0 fL    MCH 28.6 25.4 - 34.6 pg    MCHC 31.9 30.0 - 36.0 gm/dl    PLATELET 272 140 - 450  1000/mm3    MPV 10.4 (H) 6.0 - 10.0 fL    RDW-SD 56.4 (H) 36.4 - 46.3      NRBC 0 0 - 0      IMMATURE GRANULOCYTES 0.8 0.0 - 3.0 %    NEUTROPHILS 67.7 (H) 34 - 64 %    LYMPHOCYTES 15.6 (L) 28 - 48 %    MONOCYTES 13.0 1 - 13 %    EOSINOPHILS 2.1 0 - 5 %    BASOPHILS 0.8 0 - 3 %  LACTIC ACID   Result Value Ref Range    Lactic Acid 3.2 (H) 0.4 - 2.0 mmol/L   TROPONIN I   Result Value Ref Range    Troponin-I <0.015 0.000 - 0.045 ng/ml   AMMONIA   Result Value Ref Range    Ammonia 37 (H) 25 - 32 umol/L   NT-PRO BNP   Result Value Ref Range    NT pro-BNP 151.0 (H) 0.0 - 125.0 pg/ml   POC CHEM8   Result Value Ref Range    Sodium 139 136 - 145 mEq/L    Potassium 3.4 (L) 3.5 - 4.9 mEq/L    Chloride 99 98 - 107 mEq/L    CO2, TOTAL 30 21 - 32 mmol/L    Glucose 87 74 - 106 mg/dL    BUN 9 7 - 25 mg/dl    Creatinine 0.5 (L) 0.6 - 1.3 mg/dl    HCT 36 (L) 38 - 45 %    HGB 12.2 (L) 12.4 - 17.2 gm/dl    CALCIUM,IONIZED 5.20 4.40 - 5.40 mg/dL   POC URINE MACROSCOPIC   Result Value Ref Range    Glucose Negative NEGATIVE,Negative mg/dl    Bilirubin Small (A) NEGATIVE,Negative      Ketone Trace (A) NEGATIVE,Negative mg/dl    Specific gravity 1.025 1.005 - 1.030      Blood Negative NEGATIVE,Negative      pH (UA) 5.5 5 - 9      Protein Trace (A) NEGATIVE,Negative mg/dl    Urobilinogen >=8.0 0.0 - 1.0 EU/dl    Nitrites Negative NEGATIVE,Negative      Leukocyte Esterase Negative NEGATIVE,Negative      Color Orange      Appearance Clear         Imaging:    Xr Chest Sngl V    Result Date: 08/17/2018  Indication: Short of breath Frontal view chest Heart mildly enlarged. Shallow inspiration with interstitial and patchy alveolar densities throughout the lungs. Anterior cervical fusion at 3 adjacent lower cervical levels. Degenerative narrowing and sclerosis bilateral humeral joint. This rotated to the right.     IMPRESSION: Edema versus multifocal pneumonia or chronic changes bilaterally. Cardiomegaly. Postsurgical changes cervical spine.     Ct  Head Wo Cont    Result Date: 08/17/2018  DICOM format image data is available to nonaffiliated external healthcare facilities or entities on a secure, media free, reciprocally searchable basis with patient authorization for 12 months following the date of the study. Indication: Altered mental status Technique:  5 millimeter axial images without contrast were obtained through the brain. Comparison: none Findings:   There is generalized moderate cerebral atrophy.  Low attenuation surrounds the lateral ventricles. There is a rounded 16 x 14 mm mass in the right thalamus surrounding low-density edema. The mass is hyperdense peripherally and low density centrally. There is also focal low-density in the right parietal white matter suggesting vasogenic edema. Focal low-density seen more laterally in the right parietal cortex measuring 2.2 cm diameter. Apparent 2.6 cm x 2.2 cm mass is seen in the right cerebellum with surrounding low-density suggesting vasogenic edema. Asymmetric 11 mm low-density irregular lytic lesion is seen in the diploic space of the right parietal bone. No hemorrhage seen. Sella, pineal, craniocervical junction, orbits, paranasal sinuses, and temporal bones are unremarkable.      Impression: cerebral atrophy and nonspecific white matter changes not unusual for age; multiple lesions as described in brain and calvarium worrisome for metastatic disease on this noncontrast study. Contrast  MRI suggested for further evaluation.     Cta Chest W Or W Wo Cont    Result Date: 08/17/2018  DICOM format image data is available to nonaffiliated external healthcare facilities or entities on a secure, media free, reciprocally searchable basis with patient authorization for 12 months following the date of the study. Indication: Altered mental status Technique: 3 millimeter axial images with IV contrast obtained from lung apices to bases. 3-D MIP CTA images obtained. Comparison: Frontal view chest 08/17/2018 Findings:   Mediastinum: No aortic dissection or pulmonary embolus. Calcified aortic arch and mild coronary calcifications. 10 mm low-density focus right thyroid lobe. Mild cardiomegaly. Small bilateral pleural effusions. Lungs: Numerous nodules of varying sizes throughout the lungs largest of which left lower lobe measures 18 x 19 mm. Focal pleural thickening adjacent to rib destruction right lower lobe posterolaterally. Bones/ chest wall: Extensive lytic lesions throughout the skeletal system of the thorax including sternum, spine, and ribs. High-grade compression fractures T3 and T4 with mild retropulsion. Anterior cervical fusion. Multiple masses are seen throughout the right breast the largest of which is partially imaged laterally at 6.8 x 3.5 cm; there is overlying right breast skin thickening. Asymmetric upper normal size nodes are seen in the right axilla. Below the diaphragm liver is heterogeneously low in density.     Impression: 1. No evidence of pulmonary embolus or aortic dissection. 2. 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 3. Indeterminate lesion right thyroid lobe which could be evaluated with ultrasound. 4. Cardiomegaly. 5. Bilateral small pleural effusions. 6. Multiple lesions throughout lungs and bones consistent with metastatic disease possibly from breast primary with asymmetric multi centric lesions throughout the right breast and overlying skin thickening with asymmetric upper normal size right axillary nodes. 7. Compression fractures T3 and T4 with retropulsion. 8. Heterogeneous liver. Metastatic disease not excluded.     Ct Abd Pelv W Cont    Result Date: 08/17/2018  DICOM format image data is available to nonaffiliated external healthcare facilities or entities on a secure, media free, reciprocally searchable basis with patient authorization for 12 months following the date of the study. TECHNIQUE: CT of the abdomen and pelvis WITH intravenous contrast.  The abdomen and pelvis  were scanned utilizing a multidetector helical scanner from the diaphragm to the lesser trochanter without the oral administration of barium.  Coronal and sagittal reformations were obtained. COMPARISON: none INDICATION: Altered mental status DISCUSSION: HEPATOBILIARY: Multiple low-density ill-defined lesions throughout the liver with mild hepatomegaly. Gallbladder mildly thick-walled but otherwise unremarkable. SPLEEN: No splenomegaly or focal lesion. PANCREAS: Atrophic. ADRENALS: No adrenal nodules. KIDNEYS/URETERS: 17 mm rounded low-density right lower renal pole cystic cyst without hydronephrosis. PELVIC ORGANS/BLADDER: Uterus atrophic or removed. Extensive artifact in pelvis due to right hip replacement. PERITONEUM / RETROPERITONEUM: No free air or fluid. LYMPH NODES: No lymphadenopathy. VESSELS: Heavy calcification in nondilated aorta and iliac vessels. GI TRACT: No distention or wall thickening. No evidence of appendicitis. Large amount retained fecal material in colon and rectum. BONES AND SOFT TISSUES: Extensive lytic destruction of the bony structures throughout spine and pelvis with left hip replacement. Multiple compression fractures of vertebral bodies moderate grade at L3 and low-grade at L2 and L4 without significant retropulsion. Small fracture right superior acetabular margin laterally.     IMPRESSION: 1. 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 Compression fractures L2 through L4. Small acute fracture suspected at superior lateral right acetabular margin. 2. Indeterminate right renal lesion likely represent cyst which could be confirmed with  ultrasound. 3. Left hip replacement. 4. Uterus atrophic or removed. 5. Atherosclerotic vascular disease. 6. Constipation.       ED Course/MDM   Patient given Vancomycin and Zosyn, and fluids. CXR per radiologist read shows possible pneumonia vs chronic changes.  Antibiotics were ordered.  Lactic acid and ammonia increased. WBC, troponin, and  urine unremarkable. Patient seen by Dr. Pamella Pert. Awaiting CT results.     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  Chest CTA shows bilateral pleural effusions.  Patient's daughter was informed of the results.  Patient continues with tachycardia despite treatment.  I will discuss admission with hospitalist.    10:56 PM D/w Dr. Rosalene Billings who will admit to stepdown unit.    Final Diagnosis       ICD-10-CM ICD-9-CM   1. Malignant neoplasm of breast, stage 4, unspecified laterality (HCC) C50.919 174.9   2. Altered mental status, unspecified altered mental status type R41.82 780.97   3. Pleural effusion J90 511.9   4. Dehydration E86.0 276.51   5. Brain mass G93.9 348.9   6. 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 Lactic acidosis E87.2 276.2       Disposition   Admission.    Deniece Batichon, PA-C  August 17, 2018    The patient was personally evaluated by myself and Dr. Terald Sleeper, MD who agrees with the above assessment and plan.    My signature above authenticates this document and my orders, the final    diagnosis (es), discharge prescription (s), and instructions in the Epic    record.  If you have any questions please contact [phone].     Nursing notes have been reviewed by the physician/ advanced practice    Clinician.

## 2018-08-17 NOTE — Progress Notes (Signed)
 [facility] Dosing Services: Vancomycin    Consult for Vancomycin Dosing by Pharmacy by Ottis Dunker  Consult provided for this 77 y.o. year old female , for indication of sepsis.  Day of Therapy 0    Ht Readings from Last 1 Encounters:   08/17/18 167.6 cm (66)        Wt Readings from Last 1 Encounters:   08/17/18 55.3 kg (122 lb)        Previous Regimen    Last Level    Other Current Antibiotics Zosyn   Significant Cultures    Serum Creatinine Lab Results   Component Value Date/Time    Creatinine 0.6 08/17/2018 08:15 PM    Creatinine 0.5 (L) 08/17/2018 08:15 PM        Creatinine Clearance Estimated Creatinine Clearance: 71.8 mL/min (based on SCr of 0.6 mg/dL).   BUN Lab Results   Component Value Date/Time    BUN 11 08/17/2018 08:15 PM    BUN 9 08/17/2018 08:15 PM        WBC Lab Results   Component Value Date/Time    WBC 6.3 08/17/2018 08:15 PM        H/H Lab Results   Component Value Date/Time    HGB 10.8 (L) 08/17/2018 08:15 PM    HGB 12.2 (L) 08/17/2018 08:15 PM        Platelets Lab Results   Component Value Date/Time    PLATELET 272 08/17/2018 08:15 PM        Temp 98.3 F (36.8 C)     Start Vancomycin therapy, with loading dose of 1000 (mg) on 9/24 @ 2200  Follow with maintenance dose of 750(mg) every 12 hours    Dose calculated to approximate a therapeutic trough of 15-20 mcg/mL.      Will draw a vancomycin trough prior to the fourth dose on 9/25 @ 0900     Thank you,  Damien Lema, PharmD

## 2018-08-17 NOTE — H&P (Signed)
Medicine History and Physical    Patient: Kayla [name] Age: 77 y.o. 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 Altered mental status         HPI:   Kayla [name] is a 77 y.o. year old female who presents with decreased PO intake, generalized weakness since being placed on oral chemotherapy last week. Today, patient noted to be confused, lethargic. In ED, Patient awake and oriented to person however not time or place. Patient on presentation, tachycardic, and appeared dehydrated.   In ED, Vancomycin and Zosyn initiated.       Review of Systems - 12 Point ROS -ve except what is noted in the HPI.     Past Medical History:  No past medical history on file.    Past Surgical History:  No past surgical history on file.    Family History:  No family history on file.    Social History:  Social History     Socioeconomic History   ??? Marital status: DIVORCED     Spouse name: Not on file   ??? Number of children: Not on file   ??? Years of education: Not on file   ??? Highest education level: Not on file       Home Medications:  Prior to Admission medications    Not on File       Allergies:  No Known Allergies      Physical Exam:     Visit Vitals  BP 129/72   Pulse (!) 105   Temp 98.3 ??F (36.8 ??C)   Resp 13   Ht '5\' 6"'$  (1.676 m)   Wt 55.3 kg (122 lb)   SpO2 100%   BMI 19.69 kg/m??       Physical Exam:  General  appearance: alert, cooperative, no distress, appears stated age  Head: Normocephalic, without obvious abnormality, atraumatic  Neck: supple, trachea midline  Lungs: clear to auscultation bilaterally  Heart: regular rate and rhythm, S1, S2 normal, no murmur, click, rub or gallop  Abdomen: soft, non-tender. Bowel sounds normal. No masses,  no organomegaly  Extremities: extremities normal, atraumatic, no cyanosis or edema  Skin: dry skin, dry MM  Neurologic: Grossly normal      Intake and Output:  Current Shift:  09/23 1901 - 09/24 0700  In: 1000 [I.V.:1000]  Out: -   Last three shifts:  No intake/output data recorded.    Lab/Data Reviewed:  Lab:   Recent Results (from the past 12 hour(s))   METABOLIC PANEL, COMPREHENSIVE    Collection Time: 08/17/18  8:15 PM   Result Value Ref Range    Sodium 140 136 - 145 mEq/L    Potassium 3.3 (L) 3.5 - 5.1 mEq/L  Chloride 101 98 - 107 mEq/L    CO2 31 21 - 32 mEq/L    Glucose 82 74 - 106 mg/dl    BUN 11 7 - 25 mg/dl    Creatinine 0.6 0.6 - 1.3 mg/dl    GFR est AA >60.0      GFR est non-AA >60      Calcium 10.9 (H) 8.5 - 10.1 mg/dl    AST (SGOT) 505 (H) 15 - 37 U/L    ALT (SGPT) 61 12 - 78 U/L    Alk. phosphatase 836 (H) 45 - 117 U/L    Bilirubin, total 1.9 (H) 0.2 - 1.0 mg/dl    Protein, total 7.2 6.4 - 8.2 gm/dl    Albumin 2.5 (L) 3.4 - 5.0 gm/dl    Anion gap 8 5 - 15 mmol/L   CBC WITH AUTOMATED DIFF    Collection Time: 08/17/18  8:15 PM   Result Value Ref Range    WBC 6.3 4.0 - 11.0 1000/mm3    RBC 3.77 3.60 - 5.20 M/uL    HGB 10.8 (L) 13.0 - 17.2 gm/dl    HCT 33.9 (L) 37.0 - 50.0 %    MCV 89.9 80.0 - 98.0 fL    MCH 28.6 25.4 - 34.6 pg    MCHC 31.9 30.0 - 36.0 gm/dl    PLATELET 272 140 - 450 1000/mm3    MPV 10.4 (H) 6.0 - 10.0 fL    RDW-SD 56.4 (H) 36.4 - 46.3      NRBC 0 0 - 0      IMMATURE GRANULOCYTES 0.8 0.0 - 3.0 %    NEUTROPHILS 67.7 (H) 34 - 64 %    LYMPHOCYTES 15.6 (L) 28 - 48 %    MONOCYTES 13.0 1 - 13 %    EOSINOPHILS 2.1 0 - 5 %    BASOPHILS 0.8 0 - 3 %   LACTIC ACID     Collection Time: 08/17/18  8:15 PM   Result Value Ref Range    Lactic Acid 3.2 (H) 0.4 - 2.0 mmol/L   TROPONIN I    Collection Time: 08/17/18  8:15 PM   Result Value Ref Range    Troponin-I <0.015 0.000 - 0.045 ng/ml   AMMONIA    Collection Time: 08/17/18  8:15 PM   Result Value Ref Range    Ammonia 37 (H) 25 - 32 umol/L   POC CHEM8    Collection Time: 08/17/18  8:15 PM   Result Value Ref Range    Sodium 139 136 - 145 mEq/L    Potassium 3.4 (L) 3.5 - 4.9 mEq/L    Chloride 99 98 - 107 mEq/L    CO2, TOTAL 30 21 - 32 mmol/L    Glucose 87 74 - 106 mg/dL    BUN 9 7 - 25 mg/dl    Creatinine 0.5 (L) 0.6 - 1.3 mg/dl    HCT 36 (L) 38 - 45 %    HGB 12.2 (L) 12.4 - 17.2 gm/dl    CALCIUM,IONIZED 5.20 4.40 - 5.40 mg/dL   NT-PRO BNP    Collection Time: 08/17/18  8:15 PM   Result Value Ref Range    NT pro-BNP 151.0 (H) 0.0 - 125.0 pg/ml   POC URINE MACROSCOPIC    Collection Time: 08/17/18  8:37 PM   Result Value Ref Range    Glucose Negative NEGATIVE,Negative mg/dl    Bilirubin Small (A) NEGATIVE,Negative      Ketone Trace (A) NEGATIVE,Negative  mg/dl    Specific gravity 1.025 1.005 - 1.030      Blood Negative NEGATIVE,Negative      pH (UA) 5.5 5 - 9      Protein Trace (A) NEGATIVE,Negative mg/dl    Urobilinogen >=8.0 0.0 - 1.0 EU/dl    Nitrites Negative NEGATIVE,Negative      Leukocyte Esterase Negative NEGATIVE,Negative      Color Orange      Appearance Clear         Imaging:    Xr Chest Sngl V    Result Date: 08/17/2018  Indication: Short of breath Frontal view chest Heart mildly enlarged. Shallow inspiration with interstitial and patchy alveolar densities throughout the lungs. Anterior cervical fusion at 3 adjacent lower cervical levels. Degenerative narrowing and sclerosis bilateral humeral joint. This rotated to the right.     IMPRESSION: Edema versus multifocal pneumonia or chronic changes bilaterally. Cardiomegaly. Postsurgical changes cervical spine.     Ct Head Wo Cont    Result Date: 08/17/2018  DICOM format image data  is available to nonaffiliated external healthcare facilities or entities on a secure, media free, reciprocally searchable basis with patient authorization for 12 months following the date of the study. Indication: Altered mental status Technique:  5 millimeter axial images without contrast were obtained through the brain. Comparison: none Findings:   There is generalized moderate cerebral atrophy.  Low attenuation surrounds the lateral ventricles. There is a rounded 16 x 14 mm mass in the right thalamus surrounding low-density edema. The mass is hyperdense peripherally and low density centrally. There is also focal low-density in the right parietal white matter suggesting vasogenic edema. Focal low-density seen more laterally in the right parietal cortex measuring 2.2 cm diameter. Apparent 2.6 cm x 2.2 cm mass is seen in the right cerebellum with surrounding low-density suggesting vasogenic edema. Asymmetric 11 mm low-density irregular lytic lesion is seen in the diploic space of the right parietal bone. No hemorrhage seen. Sella, pineal, craniocervical junction, orbits, paranasal sinuses, and temporal bones are unremarkable.      Impression: cerebral atrophy and nonspecific white matter changes not unusual for age; multiple lesions as described in brain and calvarium worrisome for metastatic disease on this noncontrast study. Contrast MRI suggested for further evaluation.     Cta Chest W Or W Wo Cont    Result Date: 08/17/2018  DICOM format image data is available to nonaffiliated external healthcare facilities or entities on a secure, media free, reciprocally searchable basis with patient authorization for 12 months following the date of the study. Indication: Altered mental status Technique: 3 millimeter axial images with IV contrast obtained from lung apices to bases. 3-D MIP CTA images obtained. Comparison: Frontal view chest 08/17/2018 Findings:  Mediastinum: No aortic dissection or pulmonary embolus. Calcified  aortic arch and mild coronary calcifications. 10 mm low-density focus right thyroid lobe. Mild cardiomegaly. Small bilateral pleural effusions. Lungs: Numerous nodules of varying sizes throughout the lungs largest of which left lower lobe measures 18 x 19 mm. Focal pleural thickening adjacent to rib destruction right lower lobe posterolaterally. Bones/ chest wall: Extensive lytic lesions throughout the skeletal system of the thorax including sternum, spine, and ribs. High-grade compression fractures T3 and T4 with mild retropulsion. Anterior cervical fusion. Multiple masses are seen throughout the right breast the largest of which is partially imaged laterally at 6.8 x 3.5 cm; there is overlying right breast skin thickening. Asymmetric upper normal size nodes are seen in the right axilla. Below the diaphragm  liver is heterogeneously low in density.     Impression: 1. No evidence of pulmonary embolus or aortic dissection. 2. 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 3. Indeterminate lesion right thyroid lobe which could be evaluated with ultrasound. 4. Cardiomegaly. 5. Bilateral small pleural effusions. 6. Multiple lesions throughout lungs and bones consistent with metastatic disease possibly from breast primary with asymmetric multi centric lesions throughout the right breast and overlying skin thickening with asymmetric upper normal size right axillary nodes. 7. Compression fractures T3 and T4 with retropulsion. 8. Heterogeneous liver. Metastatic disease not excluded.     Ct Abd Pelv W Cont    Result Date: 08/17/2018  DICOM format image data is available to nonaffiliated external healthcare facilities or entities on a secure, media free, reciprocally searchable basis with patient authorization for 12 months following the date of the study. TECHNIQUE: CT of the abdomen and pelvis WITH intravenous contrast.  The abdomen and pelvis were scanned utilizing a multidetector helical scanner from the  diaphragm to the lesser trochanter without the oral administration of barium.  Coronal and sagittal reformations were obtained. COMPARISON: none INDICATION: Altered mental status DISCUSSION: HEPATOBILIARY: Multiple low-density ill-defined lesions throughout the liver with mild hepatomegaly. Gallbladder mildly thick-walled but otherwise unremarkable. SPLEEN: No splenomegaly or focal lesion. PANCREAS: Atrophic. ADRENALS: No adrenal nodules. KIDNEYS/URETERS: 17 mm rounded low-density right lower renal pole cystic cyst without hydronephrosis. PELVIC ORGANS/BLADDER: Uterus atrophic or removed. Extensive artifact in pelvis due to right hip replacement. PERITONEUM / RETROPERITONEUM: No free air or fluid. LYMPH NODES: No lymphadenopathy. VESSELS: Heavy calcification in nondilated aorta and iliac vessels. GI TRACT: No distention or wall thickening. No evidence of appendicitis. Large amount retained fecal material in colon and rectum. BONES AND SOFT TISSUES: Extensive lytic destruction of the bony structures throughout spine and pelvis with left hip replacement. Multiple compression fractures of vertebral bodies moderate grade at L3 and low-grade at L2 and L4 without significant retropulsion. Small fracture right superior acetabular margin laterally.     IMPRESSION: 1. 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 Compression fractures L2 through L4. Small acute fracture suspected at superior lateral right acetabular margin. 2. Indeterminate right renal lesion likely represent cyst which could be confirmed with ultrasound. 3. Left hip replacement. 4. Uterus atrophic or removed. 5. Atherosclerotic vascular disease. 6. Constipation.       Rosalene Billings, MD  August 17, 2018

## 2018-08-17 NOTE — ED Notes (Signed)
Slow decline in mental status started a couple weeks ago at which time she was started on a new oral chemo.  This week she has declined to inability to walk and only alert to person.

## 2018-08-18 LAB — EKG, 12 LEAD, INITIAL
Atrial Rate: 102 {beats}/min
Calculated P Axis: 54 degrees
Calculated R Axis: -15 degrees
Calculated T Axis: 53 degrees
P-R Interval: 166 ms
Q-T Interval: 362 ms
QRS Duration: 90 ms
QTC Calculation (Bezet): 471 ms
Ventricular Rate: 102 {beats}/min

## 2018-08-18 LAB — METABOLIC PANEL, COMPREHENSIVE
ALT (SGPT): 61 U/L (ref 12–78)
AST (SGOT): 505 U/L — ABNORMAL HIGH (ref 15–37)
Albumin: 2.5 gm/dl — ABNORMAL LOW (ref 3.4–5.0)
Alk. phosphatase: 836 U/L — ABNORMAL HIGH (ref 45–117)
Anion gap: 8 mmol/L (ref 5–15)
BUN: 11 mg/dl (ref 7–25)
Bilirubin, total: 1.9 mg/dl — ABNORMAL HIGH (ref 0.2–1.0)
CO2: 31 mEq/L (ref 21–32)
Calcium: 10.9 mg/dl — ABNORMAL HIGH (ref 8.5–10.1)
Chloride: 101 mEq/L (ref 98–107)
Creatinine: 0.6 mg/dl (ref 0.6–1.3)
GFR est AA: >60
GFR est non-AA: >60
Glucose: 82 mg/dl (ref 74–106)
Potassium: 3.3 mEq/L — ABNORMAL LOW (ref 3.5–5.1)
Protein, total: 7.2 gm/dl (ref 6.4–8.2)
Sodium: 140 mEq/L (ref 136–145)

## 2018-08-18 LAB — CBC WITH AUTOMATED DIFF
BASOPHILS: 0.8 % (ref 0–3)
EOSINOPHILS: 2.1 % (ref 0–5)
HCT: 33.9 % — ABNORMAL LOW (ref 37.0–50.0)
HGB: 10.8 gm/dl — ABNORMAL LOW (ref 13.0–17.2)
IMMATURE GRANULOCYTES: 0.8 % (ref 0.0–3.0)
LYMPHOCYTES: 15.6 % — ABNORMAL LOW (ref 28–48)
MCH: 28.6 pg (ref 25.4–34.6)
MCHC: 31.9 gm/dl (ref 30.0–36.0)
MCV: 89.9 fL (ref 80.0–98.0)
MONOCYTES: 13 % (ref 1–13)
MPV: 10.4 fL — ABNORMAL HIGH (ref 6.0–10.0)
NEUTROPHILS: 67.7 % — ABNORMAL HIGH (ref 34–64)
NRBC: 0 (ref 0–0)
PLATELET: 272 10*3/uL (ref 140–450)
RBC: 3.77 M/uL (ref 3.60–5.20)
RDW-SD: 56.4 — ABNORMAL HIGH (ref 36.4–46.3)
WBC: 6.3 10*3/uL (ref 4.0–11.0)

## 2018-08-18 LAB — POC CHEM8
BUN: 9 mg/dl (ref 7–25)
BUN: 9 mg/dl (ref 7–25)
CALCIUM,IONIZED: 5.2 mg/dL (ref 4.40–5.40)
CALCIUM,IONIZED: 5.2 mg/dL (ref 4.40–5.40)
CO2 Total: 30 mmol/L (ref 21–32)
CO2, TOTAL: 30 mmol/L (ref 21–32)
Chloride: 99 mEq/L (ref 98–107)
Chloride: 99 mEq/L (ref 98–107)
Creatinine: 0.5 mg/dl — ABNORMAL LOW (ref 0.6–1.3)
Creatinine: 0.5 mg/dl — ABNORMAL LOW (ref 0.6–1.3)
Glucose: 87 mg/dL (ref 74–106)
Glucose: 87 mg/dL (ref 74–106)
HCT: 36 % — ABNORMAL LOW (ref 38–45)
HGB: 12.2 gm/dl — ABNORMAL LOW (ref 12.4–17.2)
Hematocrit: 36 % — ABNORMAL LOW (ref 38–45)
Hemoglobin: 12.2 gm/dl — ABNORMAL LOW (ref 12.4–17.2)
Potassium: 3.4 mEq/L — ABNORMAL LOW (ref 3.5–4.9)
Potassium: 3.4 mEq/L — ABNORMAL LOW (ref 3.5–4.9)
Sodium: 139 mEq/L (ref 136–145)
Sodium: 139 mEq/L (ref 136–145)

## 2018-08-18 LAB — AMMONIA
Ammonia, plasma: 37 umol/L — ABNORMAL HIGH (ref 25–32)
Ammonia: 37 umol/L — ABNORMAL HIGH (ref 25–32)

## 2018-08-18 LAB — CREATININE
Creatinine: 0.5 mg/dl — ABNORMAL LOW (ref 0.6–1.3)
Creatinine: 0.5 mg/dl — ABNORMAL LOW (ref 0.6–1.3)
EGFR IF NonAfrican American: >60
GFR African American: >60
GFR est AA: >60
GFR est non-AA: >60

## 2018-08-18 LAB — LACTIC ACID
LACTIC ACID: 3.2 mmol/L — ABNORMAL HIGH (ref 0.4–2.0)
Lactic Acid: 3.2 mmol/L — ABNORMAL HIGH (ref 0.4–2.0)

## 2018-08-18 LAB — POC URINE MACROSCOPIC
Blood, Urine: NEGATIVE
Blood: NEGATIVE
Glucose, Ur: NEGATIVE mg/dl
Glucose: NEGATIVE mg/dl
Leukocyte Esterase, Urine: NEGATIVE
Leukocyte Esterase: NEGATIVE
Nitrite, Urine: NEGATIVE
Nitrites: NEGATIVE
Specific Gravity, UA: 1.025 (ref 1.005–1.030)
Specific gravity: 1.025 (ref 1.005–1.030)
Urobilinogen, UA, POCT: >=8 EU/dl (ref 0.0–1.0)
Urobilinogen: >=8 EU/dl (ref 0.0–1.0)
pH (UA): 5.5 (ref 5–9)
pH, UA: 5.5 (ref 5–9)

## 2018-08-18 LAB — TROPONIN I: Troponin-I: <0.015 ng/ml (ref 0.000–0.045)

## 2018-08-18 LAB — NT-PRO BNP: NT pro-BNP: 151 pg/ml — ABNORMAL HIGH (ref 0.0–125.0)

## 2018-08-18 LAB — COMPREHENSIVE METABOLIC PANEL
ALT: 61 U/L (ref 12–78)
AST: 505 U/L — ABNORMAL HIGH (ref 15–37)
Albumin: 2.5 gm/dl — ABNORMAL LOW (ref 3.4–5.0)
Alkaline Phosphatase: 836 U/L — ABNORMAL HIGH (ref 45–117)
Anion Gap: 8 mmol/L (ref 5–15)
BUN: 11 mg/dl (ref 7–25)
CO2: 31 mEq/L (ref 21–32)
Calcium: 10.9 mg/dl — ABNORMAL HIGH (ref 8.5–10.1)
Chloride: 101 mEq/L (ref 98–107)
Creatinine: 0.6 mg/dl (ref 0.6–1.3)
EGFR IF NonAfrican American: >60
GFR African American: >60
Glucose: 82 mg/dl (ref 74–106)
Potassium: 3.3 mEq/L — ABNORMAL LOW (ref 3.5–5.1)
Sodium: 140 mEq/L (ref 136–145)
Total Bilirubin: 1.9 mg/dl — ABNORMAL HIGH (ref 0.2–1.0)
Total Protein: 7.2 gm/dl (ref 6.4–8.2)

## 2018-08-18 LAB — EKG 12-LEAD
Atrial Rate: 102 {beats}/min
P Axis: 54 degrees
P-R Interval: 166 ms
Q-T Interval: 362 ms
QRS Duration: 90 ms
QTc Calculation (Bazett): 471 ms
R Axis: -15 degrees
T Axis: 53 degrees
Ventricular Rate: 102 {beats}/min

## 2018-08-18 LAB — CBC WITH AUTO DIFFERENTIAL
Basophils %: 0.8 % (ref 0–3)
Eosinophils %: 2.1 % (ref 0–5)
Hematocrit: 33.9 % — ABNORMAL LOW (ref 37.0–50.0)
Hemoglobin: 10.8 gm/dl — ABNORMAL LOW (ref 13.0–17.2)
Immature Granulocytes: 0.8 % (ref 0.0–3.0)
Lymphocytes %: 15.6 % — ABNORMAL LOW (ref 28–48)
MCH: 28.6 pg (ref 25.4–34.6)
MCHC: 31.9 gm/dl (ref 30.0–36.0)
MCV: 89.9 fL (ref 80.0–98.0)
MPV: 10.4 fL — ABNORMAL HIGH (ref 6.0–10.0)
Monocytes %: 13 % (ref 1–13)
Neutrophils %: 67.7 % — ABNORMAL HIGH (ref 34–64)
Nucleated RBCs: 0 (ref 0–0)
Platelets: 272 10*3/uL (ref 140–450)
RBC: 3.77 M/uL (ref 3.60–5.20)
RDW-SD: 56.4 — ABNORMAL HIGH (ref 36.4–46.3)
WBC: 6.3 10*3/uL (ref 4.0–11.0)

## 2018-08-18 LAB — PROBNP, N-TERMINAL: BNP: 151 pg/ml — ABNORMAL HIGH (ref 0.0–125.0)

## 2018-08-18 LAB — TROPONIN: Troponin I: <0.015 ng/ml (ref 0.000–0.045)

## 2018-08-18 MED ORDER — NALOXONE 0.4 MG/ML INJECTION
0.4 mg/mL | INTRAMUSCULAR | Status: DC | PRN
Start: 2018-08-18 — End: 2018-09-03

## 2018-08-18 MED ORDER — FLU VACCINE QV 2019-20 (4 YRS+)(PF) 60 MCG (15 MCG X 4)/0.5 ML IM SYRINGE
60 mcg (15 mcg x 4)/0.5 mL | INTRAMUSCULAR | Status: DC
Start: 2018-08-18 — End: 2018-09-03

## 2018-08-18 MED ORDER — IOPAMIDOL 76 % IV SOLN
370 mg iodine /mL (76 %) | Freq: Once | INTRAVENOUS | Status: AC
Start: 2018-08-18 — End: 2018-08-17
  Administered 2018-08-18: 01:00:00 via INTRAVENOUS

## 2018-08-18 MED ORDER — VANCOMYCIN IN 0.9 % SODIUM CHLORIDE 1 GRAM/250 ML IV
1 gram/250 mL | Freq: Once | INTRAVENOUS | Status: AC
Start: 2018-08-18 — End: 2018-08-18
  Administered 2018-08-18: 03:00:00 via INTRAVENOUS

## 2018-08-18 MED ORDER — DEXAMETHASONE SODIUM PHOSPHATE 4 MG/ML IJ SOLN
4 mg/mL | Freq: Four times a day (QID) | INTRAMUSCULAR | Status: DC
Start: 2018-08-18 — End: 2018-09-02
  Administered 2018-08-18 – 2018-09-02 (×60): via INTRAVENOUS

## 2018-08-18 MED ORDER — SODIUM CHLORIDE 0.9 % IV PIGGY BACK
4.5 gram | Freq: Once | INTRAVENOUS | Status: AC
Start: 2018-08-18 — End: 2018-08-17
  Administered 2018-08-18: 02:00:00 via INTRAVENOUS

## 2018-08-18 MED ORDER — ALBUTEROL SULFATE 0.083 % (0.83 MG/ML) SOLN FOR INHALATION
2.5 mg /3 mL (0.083 %) | Freq: Four times a day (QID) | RESPIRATORY_TRACT | Status: DC | PRN
Start: 2018-08-18 — End: 2018-08-22
  Administered 2018-08-20 – 2018-08-22 (×4): via RESPIRATORY_TRACT

## 2018-08-18 MED ORDER — SODIUM CHLORIDE 0.9 % IJ SYRG
Freq: Three times a day (TID) | INTRAMUSCULAR | Status: DC
Start: 2018-08-18 — End: 2018-09-03
  Administered 2018-08-18 – 2018-09-03 (×48): via INTRAVENOUS

## 2018-08-18 MED ORDER — SODIUM CHLORIDE 0.9 % IV
INTRAVENOUS | Status: AC
Start: 2018-08-18 — End: 2018-08-18
  Administered 2018-08-18 (×2): via INTRAVENOUS

## 2018-08-18 MED ORDER — SODIUM CHLORIDE 0.9 % IJ SYRG
INTRAMUSCULAR | Status: DC | PRN
Start: 2018-08-18 — End: 2018-09-03

## 2018-08-18 MED ORDER — PHARMACY VANCOMYCIN NOTE
Freq: Once | Status: DC
Start: 2018-08-18 — End: 2018-08-18

## 2018-08-18 MED ORDER — PHARMACY VANCOMYCIN NOTE
Status: DC
Start: 2018-08-18 — End: 2018-08-18

## 2018-08-18 MED ORDER — POTASSIUM CHLORIDE 10 % ORAL LIQUID
20 mEq/15 mL | ORAL | Status: DC
Start: 2018-08-18 — End: 2018-08-18

## 2018-08-18 MED ORDER — SODIUM CHLORIDE 0.9 % IV
10 gram | Freq: Two times a day (BID) | INTRAVENOUS | Status: DC
Start: 2018-08-18 — End: 2018-08-18
  Administered 2018-08-18: 17:00:00 via INTRAVENOUS

## 2018-08-18 MED ORDER — SODIUM CHLORIDE 0.9% BOLUS IV
0.9 % | INTRAVENOUS | Status: AC
Start: 2018-08-18 — End: 2018-08-18
  Administered 2018-08-18: 03:00:00 via INTRAVENOUS

## 2018-08-18 MED ORDER — ACETAMINOPHEN 325 MG TABLET
325 mg | Freq: Four times a day (QID) | ORAL | Status: DC | PRN
Start: 2018-08-18 — End: 2018-09-03
  Administered 2018-08-18 – 2018-09-02 (×5): via ORAL

## 2018-08-18 MED ORDER — ENOXAPARIN 40 MG/0.4 ML SUB-Q SYRINGE
40 mg/0.4 mL | SUBCUTANEOUS | Status: DC
Start: 2018-08-18 — End: 2018-09-03
  Administered 2018-08-18 – 2018-09-03 (×15): via SUBCUTANEOUS

## 2018-08-18 MED ORDER — PIPERACILLIN-TAZOBACTAM 3.375 GRAM IV SOLR
3.375 gram | Freq: Three times a day (TID) | INTRAVENOUS | Status: DC
Start: 2018-08-18 — End: 2018-08-19
  Administered 2018-08-18 – 2018-08-19 (×5): via INTRAVENOUS

## 2018-08-18 MED ORDER — POTASSIUM BICARBONATE-CITRIC ACID 25 MEQ EFFERVESCENT TAB
25 mEq | Freq: Two times a day (BID) | ORAL | Status: DC
Start: 2018-08-18 — End: 2018-08-25
  Administered 2018-08-18 – 2018-08-25 (×10): via ORAL

## 2018-08-18 MED FILL — VANCOMYCIN 10 GRAM IV SOLR: 10 gram | INTRAVENOUS | Qty: 750

## 2018-08-18 MED FILL — SODIUM CHLORIDE 0.9 % IV: INTRAVENOUS | Qty: 1000

## 2018-08-18 MED FILL — TYLENOL 325 MG TABLET: 325 mg | ORAL | Qty: 2

## 2018-08-18 MED FILL — ENOXAPARIN 40 MG/0.4 ML SUB-Q SYRINGE: 40 mg/0.4 mL | SUBCUTANEOUS | Qty: 0.4

## 2018-08-18 MED FILL — DEXAMETHASONE SODIUM PHOSPHATE 4 MG/ML IJ SOLN: 4 mg/mL | INTRAMUSCULAR | Qty: 1

## 2018-08-18 MED FILL — VANCOMYCIN IN 0.9 % SODIUM CHLORIDE 1 GRAM/250 ML IV: 1 gram/250 mL | INTRAVENOUS | Qty: 250

## 2018-08-18 MED FILL — PHARMACY VANCOMYCIN NOTE: Qty: 1

## 2018-08-18 MED FILL — PIPERACILLIN-TAZOBACTAM 3.375 GRAM IV SOLR: 3.375 gram | INTRAVENOUS | Qty: 3.38

## 2018-08-18 MED FILL — BD POSIFLUSH NORMAL SALINE 0.9 % INJECTION SYRINGE: INTRAMUSCULAR | Qty: 10

## 2018-08-18 MED FILL — PIPERACILLIN-TAZOBACTAM 4.5 GRAM IV SOLR: 4.5 gram | INTRAVENOUS | Qty: 4.5

## 2018-08-18 MED FILL — ISOVUE-370  76 % INTRAVENOUS SOLUTION: 370 mg iodine /mL (76 %) | INTRAVENOUS | Qty: 80

## 2018-08-18 MED FILL — POTASSIUM BICARBONATE-CITRIC ACID 25 MEQ EFFERVESCENT TAB: 25 mEq | ORAL | Qty: 1

## 2018-08-18 MED FILL — VANCOMYCIN 750 MG IV SOLUTION: 750 mg | INTRAVENOUS | Qty: 750

## 2018-08-18 NOTE — Progress Notes (Signed)
To return to PLOF:  Pt will be S with all bed mobility  Pt with be S with all transfers,  Pt will ambulate x100' with LRAD and S  Pt will be able to negotiate 3 steps with S  Pt will increase B LE strength by 1/2 grade  Pt will be S with HEP  Pt pain will be less than 5/10  physical Therapy EVALUATION    Patient: Kayla [name] (77 y.o. female)  Room: 3203/3203    Date: 08/18/2018  Start Time:  900  End Time:  922    Primary Diagnosis: Altered mental status [R41.82]  Lactic acidosis [E87.2]  Metastasis from breast cancer ([place]) [C79.9, C50.919]         Precautions: Falls and Poor Safety Awareness.  Weight bearing precautions: None      ASSESSMENT :  Based on the objective data described below, the patient presents with  Able to participate with bed mobility and bed exercises, limited by weakness and pain  Generalized muscle weakness affecting function   Decreased Strength  Decreased ADL/Functional Activities  Decreased Transfer Abilities  Decreased Ambulation Ability/Technique  Decreased Balance  Increased Pain  Decreased Activity Tolerance.      Patient???s rehabilitation potential is considered to be Good     Recommendations:  Physical Therapy  Discharge Recommendations: SNF, 24 hour supervision for safety, Home with home health PT and TBD, pending progress  Further Equipment Recommendations for Discharge: None        PLAN :  Planned Interventions:  Functional mobility training Gait Training Stair training Balance Training Therapeutic exercises Therapeutic activities Neuro muscular re-education AD training Patient/caregiver education      Frequency/Duration: Patient will be followed by physical therapy 3x / Week x4weeks to address goals.                 Orders reviewed, chart reviewed on [facility].    SUBJECTIVE:   Patient: agreed to PT    OBJECTIVE DATA SUMMARY:   Present illness history:   Problem List  Never Reviewed          Codes Class Noted    Lactic acidosis ICD-10-CM: E87.2   ICD-9-CM: 276.2  08/17/2018        Altered mental status ICD-10-CM: R41.82  ICD-9-CM: 780.97  08/17/2018        Metastasis from breast cancer [facility]) ICD-10-CM: C79.9, C50.919  ICD-9-CM: 199.1, 174.9  08/17/2018             Past Medical history:   Past Medical History:   Diagnosis Date   ??? Back pain    ??? Breast cancer [facility])        Prior Level of Function/Home Situation:   Home environment: Lives with Family, 1 story, Bedroom 1st floor.  Prior level of function: independent without limitations and Independent with ambulation  Home equipment: None      Patient found: Bed, ICU/stepdown equip, Catheter and IV.    Pain Assessment before PT session: Not rated  Pain Location: all over per pt  Pain Assessment after PT session: Not rated  Pain Location:    []           Yes, patient had pain medications  []           No, Patient has not had pain medications  []           Nurse notified    COGNITIVE STATUS:     Mental Status: Oriented x3.  Communication: normal.  Follows  commands: intact.  General Cognition: intact .  Hearing: grossly intact.  Vision:  grossly intact.      EXTREMITIES ASSESSMENT:      Tone & Sensation:   Normal    Proprioception:   Intact    Strength:    Grossly 2+/5 B LE, 3/5 B LE      Range Of Motion:  [facility]      Functional mobility and balance status:     Supine to sit -  mod. assist, max. assist, increased time, 1 person and unable ot sit EOB  Sit to Supine -  max. assist, verbal cues, manual cues, visual cues, set-up, safety concerns, increased time and 1 person        Ambulation/Gait Training:  unable    Therapeutic Exercises:    Patient received/participated in 10 minutes of treatment (therapeutic exercises/activities) immediately following evaluation and/or educational instruction during/immediately following PT evaluation.    Lower Extremities:  Supine, Glut Set, Hamstring Set, Heel Slide, Short arc quad, Straight leg raise, Hip ab/duction, Ankle pumps, BLE, Assisted and 10 reps      Balance Activities:      Activity Tolerance:   fair    Final Location:   bed, all needs close, agrees to call for assistance and nurse notified     COMMUNICATION/EDUCATION:   Education: Patient, Benefit of activity while hospitalized, Call for assistance, Out of bed 2-3 times/day, Staff assistance with mobility, Changes positions frequently, Sit out of bed for 45-60 minutes or as tolerated, Safety, Functional mobility, Demonstrates adequately, Verbalized understanding, Teaching method, Verbal and Role of PT  Barriers to Learning/Limitations: None    Please refer to care plan and patient education section for further details.    Thank you for this referral.  Luan Pulling, PT, DPT  Pager [phone]

## 2018-08-18 NOTE — Progress Notes (Signed)
ADULT MALNUTRITION GUIDELINES    Patient is a 77 y.o. female, admitted on 08/17/2018 with a diagnosis of Altered mental status [R41.82]  Lactic acidosis [E87.2]  Metastasis from breast cancer ([place]) [C79.9, C50.919].  Nutrition assessment was completed by RD and the patient was found to meet the following malnutrition criteria established by ASPEN/AND:    Adult Malnutrition Guidelines:  SEVERE PROTEIN CALORIE MALNUTRITION IN THE CONTEXT OF ACUTE INJURY/ILLNESS  Weight loss: 14% x 2 months  Energy intake: <50% energy intake compared to estimated energy needs >1 month  Body fat: severe depletion  Muscle mass: severe depletion   Other considerations: breast CA stage 4, brain mass, mets to liver    Please document Severe Protein Calorie Malnutrition in Problem List if in agreement    NUTRITION RECOMMENDATIONS:   Regular diet + Ensure Enlive BID (350 kcals, 20 gm protein each) + Magic Cup once daily (290 kcals, 9 gm protein each)     NUTRITION INITIAL EVALUATION    NUTRITION ASSESSMENT:       Reason for assessment: MST, EN/TPN Referral     Admitting diagnosis: Altered mental status [R41.82]  Lactic acidosis [E87.2]  Metastasis from breast cancer ([place]) [C79.9, C50.919]     PMH:   Past Medical History:   Diagnosis Date   ??? Back pain    ??? Breast cancer ([place])        Code Status: Full Code      Anthropometrics:  Height:   Ht Readings from Last 3 Encounters:   08/17/18 '5\' 6"'$  (1.676 m)       Weight:   Wt Readings from Last 3 Encounters:   08/18/18 57.5 kg (126 lb 12.2 oz)       Weight source:                 Bed    ?? IBW: 59 kg      ?? % IBW: 97%    ?? BMI: Body mass index is 20.46 kg/m??.    ?? UBW: ~140# per daughter    ?? Wt change: approx 20# wt loss x 2 months (14%)         '[x]'$  significant       '[]'$  not significant         '[]'$  intended         '[x]'$  not intended Diet and intake history:  ?? Current diet order: DIET REGULAR  ?? DIET NUTRITIONAL SUPPLEMENTS Lunch, Dinner; [employer]  ?? DIET NUTRITIONAL SUPPLEMENTS Dinner; Optician, dispensing     ?? Food allergies: none     ?? Diet/intake history: per daughter, served 3 meals per day, but only picking at meals, very poor po intake for past 1-2 months at least. Regular diet. Was drinking Ensure shakes 2x per day. Re: nutrition support, daughter states pt had never been on EN or TPN in the past. No imminent plan to start per daughter.            '[]'$  <50% intake x >5 days        '[x]'$  <50% intake x >1 month        '[]'$  <75% intake x >7 days         '[]'$  <75% intake x 1 month         '[]'$  <75% intake x 3 months    ?? Current appetite/PO intake:         '[]'$    N/A- NPO        [  x]   Very poor (<25% of meals)        '[]'$    Poor (<50% of meals)        '[]'$    Fair (50-75% of meals)         '[]'$    Good (>75% of meals)     ?? Assessment of current MNT: Diet appropriate, suspect chronic inadequate oral intake - insufficient to meet nutrition needs. Ensure [employer] and [employer] to increase calorie/pro intake opportunity     ?? Cultural, religious, and ethnic food preferences identified: none       Physical Assessment:  ?? GI symptoms:   Last Bowel Movement Date: (unknown)     Abdominal Assessment: Soft       ?? Chewing/swallowing issues:   +dentures, no issues with chewing or swallowing     ?? Skin integrity:   Intact        ?? Muscle wasting:   '[]'$  unable to assess at this time   '[]'$  none  '[]'$  mild:  '[x]'$  moderate: clavicle   '[x]'$  severe: temporal    ?? Fat wasting:  '[]'$  unable to assess at this time   '[]'$  none  '[]'$  mild:  '[]'$  moderate:  '[x]'$  severe: suborbital/buccal     ?? Fluid accumulation:   None         ?? Mental status:   Pt sleeping, discussed hx with daughter              ?? Functional status PTA:   Can feed self, partial assistance may be required with other ADLs        Intake and output:    Intake/Output Summary (Last 24 hours) at 08/18/2018 1047  Last data filed at 08/18/2018 0600  Gross per 24 hour   Intake 1100 ml   Output 100 ml   Net 1000 ml       Estimated daily nutrition intake needs:  ?? 1700 - 2000 kcals (30-35 kcals/kg )     ?? 70 - 80 g protein (1.2-1.4 gm/kg)    ?? 1700 mL fluid (30 ml/kg)     Living situation: '[]'$  alone, little/no support    '[]'$  alone, family nearby/supportive      '[x]'$  with family/caregiver     '[]'$  in NH/SNF     '[]'$  homeless    Current pertinent medications: zosyn, klyte, vanco, NS @ 100 ml/hr     Pertinent labs: 9/23- K+ 3.3 (rec replace), AST 505, Alk Phos 836 (malignancy, mets to liver)    Does patient meet ASPEN/AND criteria for malnutrition diagnosis: yes    NUTRITION DIAGNOSIS:     1. Severe acute protein calorie malnutrition related to breast CA stage IV, brain mass, mets to liver noted as evidenced by po <50% x > 1 month, wt loss of 14% x 2 months, mod/severe physical wasting.    NUTRITION INTERVENTION:     Recommended diet:   Regular diet + Ensure Enlive BID (350 kcals, 20 gm protein each) + Magic Cup once daily (290 kcals, 9 gm protein each)     NUTRITION MONITORING AND EVALUATION:     Nutrition level of care:  '[]'$  low       '[]'$  moderate      '[x]'$  high    Nutrition monitoring: PO intake, diet tolerance and compliance, wt, BS, CMP, hydration, medical changes    Nutrition goals: PO >75% with meals/supplements, wt stable through LOS, nutrition related lab values WNL     Rod Holler  Merrilyn Puma, [place]  08/18/18

## 2018-08-18 NOTE — Other (Signed)
Bedside and Verbal shift change report given to Katerina, RN (oncoming nurse) by Lina, RN (offgoing nurse). Report included the following information SBAR and Kardex.

## 2018-08-18 NOTE — Other (Signed)
Left message to inform daughter that pt is being transferred out.  Will cont to monitor

## 2018-08-18 NOTE — Progress Notes (Signed)
Medicine Progress Note    Patient: Kayla [name]   Age:  77 y.o.  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 Edd Arbour will evaluate  -Discussed with daughter [name] on phone at [phone], discussed mother's advanced cancer and poor prognosis.  Discussed DNR/DNI, at this point they wish all aggressive measures, full care. 15 minutes spent  -Transfer to medical floor      Subjective:   Patient seen and examined. No complaints, No N/V, F/C, CP or SOB      Objective:     Visit Vitals  BP 144/65   Pulse 94   Temp 97.4 ??F (36.3 ??C)   Resp 18   Ht 5\' 6"  (1.676 m)   Wt 57.5 kg (126 lb 12.2 oz)   SpO2 99%   BMI 20.46 kg/m??       Physical Exam:  General appearance: alert, cooperative, thin and frail appearing  Head: Normocephalic, bitemporal wasting  Neck: supple, trachea midline  Lungs: clear to auscultation bilaterally  Heart: regular rate and rhythm, S1, S2 normal, no murmur, click, rub or gallop  Abdomen: soft, non-tender. Bowel sounds normal. No masses,  no organomegaly  Extremities: extremities normal, atraumatic, no cyanosis or edema  Skin: Skin color, texture, turgor normal. No rashes or lesions in exposed areas       Medications Reviewed:  Current Facility-Administered Medications   Medication Dose Route Frequency   ??? sodium chloride (NS) flush 5-10 mL  5-10 mL IntraVENous Q8H   ??? sodium chloride (NS) flush 5-10 mL  5-10 mL IntraVENous PRN   ??? naloxone (NARCAN) injection 0.1 mg  0.1 mg IntraVENous PRN   ??? acetaminophen (TYLENOL) tablet 650 mg  650 mg Oral Q6H PRN    ??? albuterol (PROVENTIL VENTOLIN) nebulizer solution 2.5 mg  2.5 mg Nebulization Q6H PRN   ??? enoxaparin (LOVENOX) injection 40 mg  40 mg SubCUTAneous Q24H   ??? 0.9% sodium chloride infusion  100 mL/hr IntraVENous CONTINUOUS   ??? influenza vaccine 2019-20 (4 yrs+)(PF) (FLUCELVAX QUAD) injection 0.5 mL  0.5 mL IntraMUSCular PRIOR TO DISCHARGE   ??? piperacillin-tazobactam (ZOSYN) 3.375 g in 0.9% sodium chloride (MBP/ADV) 100 mL MBP  3.375 g IntraVENous Q8H   ??? vancomycin (VANCOCIN) 750 mg in 0.9% sodium chloride 250 mL IVPB  750 mg IntraVENous Q12H   ??? [START ON 08/19/2018] Vancomycin trough due 9/25 @ 0900   Other ONCE   ??? *Pharmacy dosing vancomycin  1 Each Other Rx Dosing/Monitoring   ??? potassium bicarbonate (KLYTE) tablet 25 mEq  25 mEq Oral BID         Intake and Output:  Current Shift:  No intake/output data recorded.  Last three shifts:  09/22 1901 - 09/24 0700  In: 1100 [I.V.:1100]  Out: 100 [Urine:100]    Lab/Data Reviewed:  All lab and imaging  results for the last 24 hours reviewed.    Stephanie Coup, MD  P# [phone]  August 18, 2018    [facility] medical dictation software was used for  portions of this report.  Unintended voice transcription errors may have occurred.

## 2018-08-18 NOTE — Consults (Signed)
[place]  Oncology Consultation  NAME:  Kayla [name], Kayla [name]  SEX:   F  DOB:   1941-06-14  DATE: 08/18/2018  REFERRING PHYSICIAN:    MR#    [id number]  LOCATION: RADIATION ONCOLOGY  BILLING#  [id number]    cc: Annette Stable M.D.; Aldona Bar M.D.    REFERRING PHYSICIANS:  Dr. Annette Stable, Dr. Aldona Bar.      DIAGNOSIS:  Infiltrating breast carcinoma, metastatic to brain.    HISTORY OF PRESENT ILLNESS:  The patient is a 77 year old woman who was recently diagnosed with right breast cancer.  She recently moved to this area from [place] and has been seeing Dr. Brent General.  He placed her on \\"oral medication\\" approximately 3 weeks ago, details pending at this time.  She was brought to the emergency room yesterday with altered mental status and balance.  CT angiogram of the chest and CT of the abdomen performed which show extensive bony metastases, pulmonary metastases, liver metastases and extensive right breast cancer with axillary adenopathy.  Noncontrast CT of the head was performed yesterday that shows multiple areas of metastatic disease with vasogenic edema, particularly mass in the right thalamus, right parietal cortex and right cerebellum.  She has been started on high-dose Decadron and referred for consideration of palliative radiation therapy.    PAST MEDICAL HISTORY:  Negative for previous radiation therapy or chemotherapy.  She is on oral medication, possibly hormone blockade medication, by Dr. Brent General.  Rest of medical history is pending at this time.    CURRENT MEDICATIONS:  As an inpatient include antibiotics, Decadron.    ALLERGIES:  NO KNOWN DRUG ALLERGIES.    SOCIAL HISTORY:  Unknown at this time.    FAMILY HISTORY:  Unknown.    REVIEW OF SYSTEMS:  The patient denies constitutional problem.  Denies visual problems.  Denies hearing or swallowing problems.  Denies respiratory problem.  Denies cardiac problem.  Denies gastrointestinal problem.  Denies  genitourinary problem.  Denies musculoskeletal problem.  Denies neurologic problem.  Denies psychiatric problem.  She is awake, alert in bed, answering questions but denies any problem.    PHYSICAL EXAMINATION:  GENERAL:  The patient is a well-developed, well-nourished adult female sitting up in bed eating dinner, in no apparent distress.  HEENT:  No evidence of scleral icterus.  No adenopathy in the neck or supraclavicular regions.  The patient denies dizziness, headache or bony pain at this time.    LABORATORY STUDIES:  Initial pathology of breast pending at this time.  CT of the chest, abdomen and pelvis performed yesterday, 09/23 show pulmonary metastases, hepatic metastases, multiple areas of bony metastases.  CT of the head without contrast 08/17/18 shows at least 3 areas of metastatic disease with vasogenic edema.    ICD-10 CODE:  C79.31.    STAGE OF TUMOR:  Stage IV.    ASSESSMENT:  The patient is a 77 year old woman with an apparent history of metastatic breast cancer,  who now presents with altered mental status and radiographic evidence of brain metastases.  She appears asymptomatic on Decadron.  I will try to obtain further information from Dr. Manya Silvas office tomorrow, but if this is indeed metastatic breast cancer (as the clinical and radiographic picture appears), I would recommend palliative radiation therapy to the whole brain.  The patient at this time is awake, alert but I will discuss this further with her daughter who has been caring for her.  A call was placed with no response.  I will check  with her tomorrow and if family agrees and the records are available, begin radiation treatment at that time.      ___________________  Oliver Barre MD  Dictated By: .   MLT  D:08/18/2018 17:12:54  T: 08/18/2018  [id number]

## 2018-08-18 NOTE — Progress Notes (Signed)
Bedside and Verbal shift change report given to Wannetta Sender Santiago Glad RN (oncoming nurse) by Joan Flores  Garrison Sparta RN (offgoing nurse). Report included the following information SBAR, Kardex, MAR, Recent Results and Cardiac Rhythm S.Tac.

## 2018-08-18 NOTE — Progress Notes (Signed)
TRANSFER - IN REPORT:    Verbal report received from Trish(name) on [facility]  being received from CDU(unit).      Report consisted of patient???s Situation, Background, Assessment and   Recommendations(SBAR).     Information from the following report(s) SBAR was reviewed with the receiving nurse.    Opportunity for questions and clarification was provided.      Assessment completed upon patient???s arrival to unit and care assumed.

## 2018-08-18 NOTE — Other (Signed)
TRANSFER - OUT REPORT:    Verbal report given to Jeani Hawking, RN(name) on [facility]  being transferred to 5E(unit) for routine progression of care       Report consisted of patient???s Situation, Background, Assessment and   Recommendations(SBAR).     Information from the following report(s) SBAR, Kardex, [facility] and Recent Results was reviewed with the receiving nurse.    Lines:   Peripheral IV 08/17/18 Left Antecubital (Active)   Site Assessment Clean, dry, & intact 08/18/2018 12:00 AM   Phlebitis Assessment 0 08/18/2018 12:00 AM   Infiltration Assessment 0 08/18/2018 12:00 AM   Dressing Status Clean, dry, & intact 08/18/2018 12:00 AM   Dressing Type Transparent 08/18/2018 12:00 AM   Hub Color/Line Status Capped;End cap changed 08/18/2018 12:00 AM   Action Taken Open ports on tubing capped 08/18/2018 12:00 AM   Alcohol Cap Used Yes 08/18/2018 12:00 AM        Opportunity for questions and clarification was provided.      Patient transported with:   [employer]

## 2018-08-18 NOTE — Progress Notes (Signed)
Problem: Falls - Risk of  Goal: *Absence of Falls  Description  Document Kayla [name] Fall Risk and appropriate interventions in the flowsheet.  Outcome: Progressing Towards Goal  Note:   Fall Risk Interventions:  Mobility Interventions: Assess mobility with egress test, Bed/chair exit alarm, Patient to call before getting OOB    Mentation Interventions: Adequate sleep, hydration, pain control, Door open when patient unattended, Bed/chair exit alarm, Increase mobility, Reorient patient, More frequent rounding, Toileting rounds    Medication Interventions: Assess postural VS orthostatic hypotension, Bed/chair exit alarm, Patient to call before getting OOB, Teach patient to arise slowly    Elimination Interventions: Bed/chair exit alarm, Call light in reach, Patient to call for help with toileting needs, Toileting schedule/hourly rounds    History of Falls Interventions: Bed/chair exit alarm, Door open when patient unattended

## 2018-08-18 NOTE — Other (Signed)
TRANSFER - OUT REPORT:    Verbal report given to [place] (name) on [facility]  being transferred to 3203 (unit) for routine progression of care       Report consisted of patient???s Situation, Background, Assessment and   Recommendations(SBAR).     Information from the following report(s) SBAR was reviewed with the receiving nurse.    Lines:   Peripheral IV 08/17/18 Left Antecubital (Active)   Site Assessment Clean, dry, & intact 08/17/2018  8:35 PM   Phlebitis Assessment 0 08/17/2018  8:35 PM   Infiltration Assessment 0 08/17/2018  8:35 PM   Dressing Status Clean, dry, & intact 08/17/2018  8:35 PM   Dressing Type Topical skin adhesive;Transparent 08/17/2018  8:35 PM   Hub Color/Line Status Patent;Flushed 08/17/2018  8:35 PM   Alcohol Cap Used Yes 08/17/2018  8:35 PM        Opportunity for questions and clarification was provided.      Patient transported with:   Registered Nurse

## 2018-08-18 NOTE — Progress Notes (Signed)
Medicine Progress Note    Patient: Kayla [name]   Age:  77 y.o.  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 Edd Arbour will evaluate  -Discussed with daughter [name] on phone at [phone], discussed mother's advanced cancer and poor prognosis.  Discussed DNR/DNI, at this point they wish all aggressive measures, full care. 15 minutes spent  -Transfer to medical floor      Subjective:   Patient seen and examined. No complaints, No N/V, F/C, CP or SOB      Objective:     Visit Vitals  BP 144/65   Pulse 94   Temp 97.4 ??F (36.3 ??C)   Resp 18   Ht 5\' 6"  (1.676 m)   Wt 57.5 kg (126 lb 12.2 oz)   SpO2 99%   BMI 20.46 kg/m??       Physical Exam:  General appearance: alert, cooperative, thin and frail appearing  Head: Normocephalic, bitemporal wasting  Neck: supple, trachea midline  Lungs: clear to auscultation bilaterally  Heart: regular rate and rhythm, S1, S2 normal, no murmur, click, rub or gallop  Abdomen: soft, non-tender. Bowel sounds normal. No masses,  no organomegaly  Extremities: extremities normal, atraumatic, no cyanosis or edema  Skin: Skin color, texture, turgor normal. No rashes or lesions in exposed areas       Medications Reviewed:  Current Facility-Administered Medications   Medication Dose Route Frequency   ??? sodium chloride (NS) flush 5-10 mL  5-10 mL IntraVENous Q8H   ??? sodium chloride (NS) flush 5-10 mL  5-10 mL IntraVENous PRN   ??? naloxone (NARCAN) injection 0.1 mg  0.1 mg IntraVENous PRN   ??? acetaminophen (TYLENOL) tablet 650 mg  650 mg Oral Q6H PRN   ??? albuterol  (PROVENTIL VENTOLIN) nebulizer solution 2.5 mg  2.5 mg Nebulization Q6H PRN   ??? enoxaparin (LOVENOX) injection 40 mg  40 mg SubCUTAneous Q24H   ??? 0.9% sodium chloride infusion  100 mL/hr IntraVENous CONTINUOUS   ??? influenza vaccine 2019-20 (4 yrs+)(PF) (FLUCELVAX QUAD) injection 0.5 mL  0.5 mL IntraMUSCular PRIOR TO DISCHARGE   ??? piperacillin-tazobactam (ZOSYN) 3.375 g in 0.9% sodium chloride (MBP/ADV) 100 mL MBP  3.375 g IntraVENous Q8H   ??? vancomycin (VANCOCIN) 750 mg in 0.9% sodium chloride 250 mL IVPB  750 mg IntraVENous Q12H   ??? [START ON 08/19/2018] Vancomycin trough due 9/25 @ 0900   Other ONCE   ??? *Pharmacy dosing vancomycin  1 Each Other Rx Dosing/Monitoring   ??? potassium bicarbonate (KLYTE) tablet 25 mEq  25 mEq Oral BID         Intake and Output:  Current Shift:  No intake/output data recorded.  Last three shifts:  09/22 1901 - 09/24 0700  In: 1100 [I.V.:1100]  Out: 100 [Urine:100]    Lab/Data Reviewed:  All lab and imaging  results for the last 24 hours reviewed.    Stephanie Coup, MD  P# [phone]  August 18, 2018    [facility] medical dictation software was used for  portions of this report.  Unintended voice transcription errors may have occurred.

## 2018-08-18 NOTE — Consults (Signed)
[name]  Oncology Consultation  NAME:  Kayla [name], GARNO  SEX:   F  DOB:   Oct 06, 1941  DATE: 08/18/2018  REFERRING PHYSICIAN:    MR#    [id number]  LOCATION: RADIATION ONCOLOGY  BILLING#  [id number]    KK:XFGHWEX DANSO M.D.; Aldona Bar M.D.    REFERRING PHYSICIANS:  Dr. Annette Stable, Dr. Aldona Bar.      DIAGNOSIS:  Infiltrating breast carcinoma, metastatic to brain.    HISTORY OF PRESENT ILLNESS:  The patient is a 77 year old woman who was recently diagnosed with right breast cancer.  She recently moved to this area from [place] and has been seeing Dr. Brent General.  He placed her on \\"oral medication\\" approximately 3 weeks ago, details pending at this time.  She was brought to the emergency room yesterday with altered mental status and balance.  CT angiogram of the chest and CT of the abdomen performed which show extensive bony metastases, pulmonary metastases, liver metastases and extensive right breast cancer with axillary adenopathy.  Noncontrast CT of the head was performed yesterday that shows multiple areas of metastatic disease with vasogenic edema, particularly mass in the right thalamus, right parietal cortex and right cerebellum.  She has been started on high-dose Decadron and referred for consideration of palliative radiation therapy.    PAST MEDICAL HISTORY:  Negative for previous radiation therapy or chemotherapy.  She is on oral medication, possibly hormone blockade medication, by Dr. Brent General.  Rest of medical history is pending at this time.    CURRENT MEDICATIONS:  As an inpatient include antibiotics, Decadron.    ALLERGIES:  NO KNOWN DRUG ALLERGIES.    SOCIAL HISTORY:  Unknown at this time.    FAMILY HISTORY:  Unknown.    REVIEW OF SYSTEMS:  The patient denies constitutional problem.  Denies visual problems.  Denies hearing or swallowing problems.  Denies respiratory problem.  Denies cardiac problem.  Denies gastrointestinal problem.  Denies genitourinary problem.  Denies  musculoskeletal problem.  Denies neurologic problem.  Denies psychiatric problem.  She is awake, alert in bed, answering questions but denies any problem.    PHYSICAL EXAMINATION:  GENERAL:  The patient is a well-developed, well-nourished adult female sitting up in bed eating dinner, in no apparent distress.  HEENT:  No evidence of scleral icterus.  No adenopathy in the neck or supraclavicular regions.  The patient denies dizziness, headache or bony pain at this time.    LABORATORY STUDIES:  Initial pathology of breast pending at this time.  CT of the chest, abdomen and pelvis performed yesterday, 09/23 show pulmonary metastases, hepatic metastases, multiple areas of bony metastases.  CT of the head without contrast 08/17/18 shows at least 3 areas of metastatic disease with vasogenic edema.    ICD-10 CODE:  C79.31.    STAGE OF TUMOR:  Stage IV.    ASSESSMENT:  The patient is a 77 year old woman with an apparent history of metastatic breast cancer,  who now presents with altered mental status and radiographic evidence of brain metastases.  She appears asymptomatic on Decadron.  I will try to obtain further information from Dr. Manya Silvas office tomorrow, but if this is indeed metastatic breast cancer (as the clinical and radiographic picture appears), I would recommend palliative radiation therapy to the whole brain.  The patient at this time is awake, alert but I will discuss this further with her daughter who has been caring for her.  A call was placed with no response.  I will check with  her tomorrow and if family agrees and the records are available, begin radiation treatment at that time.      ___________________  Oliver Barre MD  Dictated By: .   MLT  D:08/18/2018 17:12:54  T: 08/18/2018  [id number]

## 2018-08-18 NOTE — Progress Notes (Signed)
 ADULT MALNUTRITION GUIDELINES    Patient is a 77 y.o. female, admitted on 08/17/2018 with a diagnosis of Altered mental status [R41.82]  Lactic acidosis [E87.2]  Metastasis from breast cancer (HCC) [C79.9, C50.919].  Nutrition assessment was completed by RD and the patient was found to meet the following malnutrition criteria established by ASPEN/AND:    Adult Malnutrition Guidelines:  SEVERE PROTEIN CALORIE MALNUTRITION IN THE CONTEXT OF ACUTE INJURY/ILLNESS  Weight loss: 14% x 2 months  Energy intake: <50% energy intake compared to estimated energy needs >1 month  Body fat: severe depletion  Muscle mass: severe depletion   Other considerations: breast CA stage 4, brain mass, mets to liver    Please document Severe Protein Calorie Malnutrition in Problem List if in agreement    NUTRITION RECOMMENDATIONS:   Regular diet + Ensure Enlive BID (350 kcals, 20 gm protein each) + Magic Cup once daily (290 kcals, 9 gm protein each)     NUTRITION INITIAL EVALUATION    NUTRITION ASSESSMENT:       Reason for assessment: MST, EN/TPN Referral     Admitting diagnosis: Altered mental status [R41.82]  Lactic acidosis [E87.2]  Metastasis from breast cancer (HCC) [C79.9, C50.919]     PMH:   Past Medical History:   Diagnosis Date   . Back pain    . Breast cancer [facility])        Code Status: Full Code      Anthropometrics:  Height:   Ht Readings from Last 3 Encounters:   08/17/18 5' 6 (1.676 m)       Weight:   Wt Readings from Last 3 Encounters:   08/18/18 57.5 kg (126 lb 12.2 oz)       Weight source:                 Bed     IBW: 59 kg       % IBW: 97%     BMI: Body mass index is 20.46 kg/m.     UBW: ~140# per daughter     Wt change: approx 20# wt loss x 2 months (14%)         [x]  significant       []  not significant         []  intended         [x]  not intended Diet and intake history:   Current diet order: DIET REGULAR   DIET NUTRITIONAL SUPPLEMENTS Lunch, Dinner; Ensure [employer]   DIET NUTRITIONAL SUPPLEMENTS Dinner; [employer] allergies: none      Diet/intake history: per daughter, served 3 meals per day, but only picking at meals, very poor po intake for past 1-2 months at least. Regular diet. Was drinking Ensure shakes 2x per day. Re: nutrition support, daughter states pt had never been on EN or TPN in the past. No imminent plan to start per daughter.            []  <50% intake x >5 days        [x]  <50% intake x >1 month        []  <75% intake x >7 days         []  <75% intake x 1 month         []  <75% intake x 3 months     Current appetite/PO intake:         []    N/A- NPO        [  x]   Very poor (<25% of meals)        []    Poor (<50% of meals)        []    Fair (50-75% of meals)         []    Good (>75% of meals)      Assessment of current MNT: Diet appropriate, suspect chronic inadequate oral intake - insufficient to meet nutrition needs. Ensure [employer] and [employer] to increase calorie/pro intake opportunity      Cultural, religious, and ethnic food preferences identified: none       Physical Assessment:   GI symptoms:   Last Bowel Movement Date: (unknown)     Abdominal Assessment: Soft        Chewing/swallowing issues:   +dentures, no issues with chewing or swallowing      Skin integrity:   Intact         Muscle wasting:   []  unable to assess at this time   []  none  []  mild:  [x]  moderate: clavicle   [x]  severe: temporal     Fat wasting:  []  unable to assess at this time   []  none  []  mild:  []  moderate:  [x]  severe: suborbital/buccal      Fluid accumulation:   None          Mental status:   Pt sleeping, discussed hx with daughter               Functional status PTA:   Can feed self, partial assistance may be required with other ADLs        Intake and output:    Intake/Output Summary (Last 24 hours) at 08/18/2018 1047  Last data filed at 08/18/2018 0600  Gross per 24 hour   Intake 1100 ml   Output 100 ml   Net 1000 ml       Estimated daily nutrition intake needs:   1700 - 2000 kcals (30-35 kcals/kg )     70 - 80  g protein (1.2-1.4 gm/kg)     1700 mL fluid (30 ml/kg)     Living situation: []  alone, little/no support    []  alone, family nearby/supportive      [x]  with family/caregiver     []  in NH/SNF     []  homeless    Current pertinent medications: zosyn, klyte, vanco, NS @ 100 ml/hr     Pertinent labs: 9/23- K+ 3.3 (rec replace), AST 505, Alk Phos 836 (malignancy, mets to liver)    Does patient meet ASPEN/AND criteria for malnutrition diagnosis: yes    NUTRITION DIAGNOSIS:     1. Severe acute protein calorie malnutrition related to breast CA stage IV, brain mass, mets to liver noted as evidenced by po <50% x > 1 month, wt loss of 14% x 2 months, mod/severe physical wasting.    NUTRITION INTERVENTION:     Recommended diet:   Regular diet + Ensure Enlive BID (350 kcals, 20 gm protein each) + Magic Cup once daily (290 kcals, 9 gm protein each)     NUTRITION MONITORING AND EVALUATION:     Nutrition level of care:  []  low       []  moderate      [x]  high    Nutrition monitoring: PO intake, diet tolerance and compliance, wt, BS, CMP, hydration, medical changes    Nutrition goals: PO >75% with meals/supplements, wt stable through LOS, nutrition related lab values WNL     Levorn  Nyle, RD  08/18/18

## 2018-08-18 NOTE — Progress Notes (Signed)
 To return to PLOF:  Pt will be S with all bed mobility  Pt with be S with all transfers,  Pt will ambulate x100' with LRAD and S  Pt will be able to negotiate 3 steps with S  Pt will increase B LE strength by 1/2 grade  Pt will be S with HEP  Pt pain will be less than 5/10  physical Therapy EVALUATION    Patient: Marykay Mccleod (77 y.o. 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  Weight bearing precautions: None      ASSESSMENT :  Based on the objective data described below, the patient presents with  Able to participate with bed mobility and bed exercises, limited by weakness and pain  Generalized muscle weakness affecting function   Decreased Strength  Decreased ADL/Functional Activities  Decreased Transfer Abilities  Decreased Ambulation Ability/Technique  Decreased Balance  Increased Pain  Decreased Activity Tolerance.      Patient's rehabilitation potential is considered to be Good     Recommendations:  Physical Therapy  Discharge Recommendations: SNF, 24 hour supervision for safety, Home with home health PT and TBD, pending progress  Further Equipment Recommendations for Discharge: None        PLAN :  Planned Interventions:  Functional mobility training Gait Training Stair training Balance Training Therapeutic exercises Therapeutic activities Neuro muscular re-education AD training Patient/caregiver education      Frequency/Duration: Patient will be followed by physical therapy 3x / Week x4weeks to address goals.                 Orders reviewed, chart reviewed on [facility].    SUBJECTIVE:   Patient: agreed to PT    OBJECTIVE DATA SUMMARY:   Present illness history:   Problem List  Never Reviewed          Codes Class Noted    Lactic acidosis ICD-10-CM: E87.2  ICD-9-CM: 276.2  08/17/2018         Altered mental status ICD-10-CM: R41.82  ICD-9-CM: 780.97  08/17/2018        Metastasis from breast cancer [facility]) ICD-10-CM: C79.9, C50.919  ICD-9-CM: 199.1, 174.9  08/17/2018             Past Medical history:   Past Medical History:   Diagnosis Date   . Back pain    . Breast cancer [facility])        Prior Level of Function/Home Situation:   Home environment: Lives with Family, 1 story, Bedroom 1st floor.  Prior level of function: independent without limitations and Independent with ambulation  Home equipment: None      Patient found: Bed, ICU/stepdown equip, Catheter and IV.    Pain Assessment before PT session: Not rated  Pain Location: all over per pt  Pain Assessment after PT session: Not rated  Pain Location:    []           Yes, patient had pain medications  []           No, Patient has not had pain medications  []           Nurse notified    COGNITIVE STATUS:     Mental Status: Oriented x3.  Communication: normal.  Follows  commands: intact.  General Cognition: intact .  Hearing: grossly intact.  Vision:  grossly intact.      EXTREMITIES ASSESSMENT:      Tone & Sensation:   Normal    Proprioception:   Intact    Strength:    Grossly 2+/5 B LE, 3/5 B LE      Range Of Motion:  [facility]      Functional mobility and balance status:     Supine to sit -  mod. assist, max. assist, increased time, 1 person and unable ot sit EOB  Sit to Supine -  max. assist, verbal cues, manual cues, visual cues, set-up, safety concerns, increased time and 1 person        Ambulation/Gait Training:  unable    Therapeutic Exercises:    Patient received/participated in 10 minutes of treatment (therapeutic exercises/activities) immediately following evaluation and/or educational instruction during/immediately following PT evaluation.    Lower Extremities:  Supine, Glut Set, Hamstring Set, Heel Slide, Short arc quad, Straight leg raise, Hip ab/duction, Ankle pumps, BLE, Assisted and 10 reps      Balance Activities:     Activity Tolerance:    fair    Final Location:   bed, all needs close, agrees to call for assistance and nurse notified     COMMUNICATION/EDUCATION:   Education: Patient, Benefit of activity while hospitalized, Call for assistance, Out of bed 2-3 times/day, Staff assistance with mobility, Changes positions frequently, Sit out of bed for 45-60 minutes or as tolerated, Safety, Functional mobility, Demonstrates adequately, Verbalized understanding, Teaching method, Verbal and Role of PT  Barriers to Learning/Limitations: None    Please refer to care plan and patient education section for further details.    Thank you for this referral.  Cleatus Lemond Molt, PT, DPT  Pager [phone]

## 2018-08-18 NOTE — Progress Notes (Signed)
 TRANSFER - IN REPORT:    Verbal report received from Trish(name) on Kayla [name]  being received from CDU(unit).      Report consisted of patient's Situation, Background, Assessment and   Recommendations(SBAR).     Information from the following report(s) SBAR was reviewed with the receiving nurse.    Opportunity for questions and clarification was provided.      Assessment completed upon patient's arrival to unit and care assumed.

## 2018-08-18 NOTE — Progress Notes (Signed)
Bedside and Verbal shift change report given to Wannetta Sender Santiago Glad RN (oncoming nurse) by Joan Flores  Garrison Conway RN (offgoing nurse). Report included the following information SBAR, Kardex, MAR, Recent Results and Cardiac Rhythm S.Tac.

## 2018-08-19 LAB — CBC WITH AUTOMATED DIFF
BASOPHILS: 0.3 % (ref 0–3)
EOSINOPHILS: 0.2 % (ref 0–5)
HCT: 35.1 % — ABNORMAL LOW (ref 37.0–50.0)
HGB: 11.2 gm/dl — ABNORMAL LOW (ref 13.0–17.2)
IMMATURE GRANULOCYTES: 0.8 % (ref 0.0–3.0)
LYMPHOCYTES: 12.7 % — ABNORMAL LOW (ref 28–48)
MCH: 28.6 pg (ref 25.4–34.6)
MCHC: 31.9 gm/dl (ref 30.0–36.0)
MCV: 89.8 fL (ref 80.0–98.0)
MONOCYTES: 4.6 % (ref 1–13)
MPV: 10.7 fL — ABNORMAL HIGH (ref 6.0–10.0)
NEUTROPHILS: 81.4 % — ABNORMAL HIGH (ref 34–64)
NRBC: 0 (ref 0–0)
PLATELET: 259 10*3/uL (ref 140–450)
RBC: 3.91 M/uL (ref 3.60–5.20)
RDW-SD: 55.8 — ABNORMAL HIGH (ref 36.4–46.3)
WBC: 6.3 10*3/uL (ref 4.0–11.0)

## 2018-08-19 LAB — METABOLIC PANEL, COMPREHENSIVE
ALT (SGPT): 61 U/L (ref 12–78)
AST (SGOT): 491 U/L — ABNORMAL HIGH (ref 15–37)
Albumin: 2.3 gm/dl — ABNORMAL LOW (ref 3.4–5.0)
Alk. phosphatase: 863 U/L — ABNORMAL HIGH (ref 45–117)
Anion gap: 10 mmol/L (ref 5–15)
BUN: 11 mg/dl (ref 7–25)
Bilirubin, total: 1.7 mg/dl — ABNORMAL HIGH (ref 0.2–1.0)
CO2: 26 mEq/L (ref 21–32)
Calcium: 10.5 mg/dl — ABNORMAL HIGH (ref 8.5–10.1)
Chloride: 102 mEq/L (ref 98–107)
Creatinine: 0.6 mg/dl (ref 0.6–1.3)
GFR est AA: >60
GFR est non-AA: >60
Glucose: 128 mg/dl — ABNORMAL HIGH (ref 74–106)
Potassium: 3.6 mEq/L (ref 3.5–5.1)
Protein, total: 6.7 gm/dl (ref 6.4–8.2)
Sodium: 138 mEq/L (ref 136–145)

## 2018-08-19 LAB — MAGNESIUM
Magnesium: 1.5 mg/dl — ABNORMAL LOW (ref 1.6–2.6)
Magnesium: 1.5 mg/dl — ABNORMAL LOW (ref 1.6–2.6)

## 2018-08-19 LAB — PHOSPHORUS
Phosphorus: 2.9 mg/dl (ref 2.5–4.9)
Phosphorus: 2.9 mg/dl (ref 2.5–4.9)

## 2018-08-19 LAB — COMPREHENSIVE METABOLIC PANEL
ALT: 61 U/L (ref 12–78)
AST: 491 U/L — ABNORMAL HIGH (ref 15–37)
Albumin: 2.3 gm/dl — ABNORMAL LOW (ref 3.4–5.0)
Alkaline Phosphatase: 863 U/L — ABNORMAL HIGH (ref 45–117)
Anion Gap: 10 mmol/L (ref 5–15)
BUN: 11 mg/dl (ref 7–25)
CO2: 26 mEq/L (ref 21–32)
Calcium: 10.5 mg/dl — ABNORMAL HIGH (ref 8.5–10.1)
Chloride: 102 mEq/L (ref 98–107)
Creatinine: 0.6 mg/dl (ref 0.6–1.3)
EGFR IF NonAfrican American: >60
GFR African American: >60
Glucose: 128 mg/dl — ABNORMAL HIGH (ref 74–106)
Potassium: 3.6 mEq/L (ref 3.5–5.1)
Sodium: 138 mEq/L (ref 136–145)
Total Bilirubin: 1.7 mg/dl — ABNORMAL HIGH (ref 0.2–1.0)
Total Protein: 6.7 gm/dl (ref 6.4–8.2)

## 2018-08-19 LAB — CBC WITH AUTO DIFFERENTIAL
Basophils %: 0.3 % (ref 0–3)
Eosinophils %: 0.2 % (ref 0–5)
Hematocrit: 35.1 % — ABNORMAL LOW (ref 37.0–50.0)
Hemoglobin: 11.2 gm/dl — ABNORMAL LOW (ref 13.0–17.2)
Immature Granulocytes: 0.8 % (ref 0.0–3.0)
Lymphocytes %: 12.7 % — ABNORMAL LOW (ref 28–48)
MCH: 28.6 pg (ref 25.4–34.6)
MCHC: 31.9 gm/dl (ref 30.0–36.0)
MCV: 89.8 fL (ref 80.0–98.0)
MPV: 10.7 fL — ABNORMAL HIGH (ref 6.0–10.0)
Monocytes %: 4.6 % (ref 1–13)
Neutrophils %: 81.4 % — ABNORMAL HIGH (ref 34–64)
Nucleated RBCs: 0 (ref 0–0)
Platelets: 259 10*3/uL (ref 140–450)
RBC: 3.91 M/uL (ref 3.60–5.20)
RDW-SD: 55.8 — ABNORMAL HIGH (ref 36.4–46.3)
WBC: 6.3 10*3/uL (ref 4.0–11.0)

## 2018-08-19 MED ORDER — MAGNESIUM SULFATE 2 GRAM/50 ML IVPB
2 gram/50 mL (4 %) | Freq: Once | INTRAVENOUS | Status: AC
Start: 2018-08-19 — End: 2018-08-19
  Administered 2018-08-19: 17:00:00 via INTRAVENOUS

## 2018-08-19 MED ORDER — SODIUM CHLORIDE 0.9 % IV
30103 mg/10 mL (3 mg/mL) | Freq: Once | INTRAVENOUS | Status: AC
Start: 2018-08-19 — End: 2018-08-19
  Administered 2018-08-19: 14:00:00 via INTRAVENOUS

## 2018-08-19 MED FILL — PIPERACILLIN-TAZOBACTAM 3.375 GRAM IV SOLR: 3.375 gram | INTRAVENOUS | Qty: 3.38

## 2018-08-19 MED FILL — MAGNESIUM SULFATE 2 GRAM/50 ML IVPB: 2 gram/50 mL (4 %) | INTRAVENOUS | Qty: 50

## 2018-08-19 MED FILL — LOVENOX 40 MG/0.4 ML SUBCUTANEOUS SYRINGE: 40 mg/0.4 mL | SUBCUTANEOUS | Qty: 0.4

## 2018-08-19 MED FILL — DEXAMETHASONE SODIUM PHOSPHATE 4 MG/ML IJ SOLN: 4 mg/mL | INTRAMUSCULAR | Qty: 1

## 2018-08-19 MED FILL — BD POSIFLUSH NORMAL SALINE 0.9 % INJECTION SYRINGE: INTRAMUSCULAR | Qty: 10

## 2018-08-19 MED FILL — POTASSIUM BICARBONATE-CITRIC ACID 25 MEQ EFFERVESCENT TAB: 25 mEq | ORAL | Qty: 1

## 2018-08-19 MED FILL — ACETAMINOPHEN 325 MG TABLET: 325 mg | ORAL | Qty: 2

## 2018-08-19 MED FILL — PAMIDRONATE 3 MG/ML IV: 30 mg/10 mL (3 mg/mL) | INTRAVENOUS | Qty: 30

## 2018-08-19 NOTE — Progress Notes (Signed)
Problem: Falls - Risk of  Goal: *Absence of Falls  Description  Document Patrcia Dolly Fall Risk and appropriate interventions in the flowsheet.  Outcome: Progressing Towards Goal  Note:   Fall Risk Interventions:  Mobility Interventions: Assess mobility with egress test, Patient to call before getting OOB, PT Consult for mobility concerns, OT consult for ADLs, Communicate number of staff needed for ambulation/transfer    Mentation Interventions: Adequate sleep, hydration, pain control, Bed/chair exit alarm, Door open when patient unattended, Evaluate medications/consider consulting pharmacy, Reorient patient, Room close to nurse's station    Medication Interventions: Assess postural VS orthostatic hypotension, Bed/chair exit alarm, Evaluate medications/consider consulting pharmacy    Elimination Interventions: Bed/chair exit alarm, Toileting schedule/hourly rounds, Patient to call for help with toileting needs, Call light in reach    History of Falls Interventions: Bed/chair exit alarm, Door open when patient unattended

## 2018-08-19 NOTE — Progress Notes (Signed)
Medicine Progress Note    Patient: Kayla [name]   Age:  77 y.o.  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  Monitor off antibiotics  -Decadron IV for brain mass with vasogenic cerebral edema  -Radiation oncology consult appreciated patient down for simulation today  -Oncology Dr. Rexford Maus appreciated  -Discussed with daughter Kayla [name] on phone at [phone], discussed mother's advanced cancer and poor prognosis on 08/18/2018.  -Discussed DNR/DNI, at this point they wish all aggressive measures, full care.     Subjective:   Patient seen and examined. No complaints, No N/V, F/C, CP or SOB      Objective:     Visit Vitals  BP 142/79 (BP Patient Position: Supine)   Pulse (!) 107   Temp (!) 80.3 ??F (26.8 ??C)   Resp 18   Ht 5\' 6"  (1.676 m)   Wt 57.5 kg (126 lb 12.2 oz)   SpO2 100%   BMI 20.46 kg/m??       Physical Exam:  General appearance: alert, cooperative, thin and frail appearing  Head: Normocephalic, bitemporal wasting  Neck: supple, trachea midline  Lungs: clear to auscultation bilaterally  Heart: regular rate and rhythm, S1, S2 normal, no murmur, click, rub or gallop  Abdomen: soft, non-tender. Bowel sounds normal. No masses,  no organomegaly  Extremities: extremities normal, atraumatic, no cyanosis or edema  Skin: Skin color, texture, turgor normal. No rashes or lesions in exposed areas       Medications Reviewed:  Current Facility-Administered Medications   Medication Dose Route Frequency   ??? pamidronate (AREDIA) 90 mg in 0.9% sodium chloride 250 mL infusion  90 mg IntraVENous ONCE   ??? magnesium sulfate 2 g/50 ml IVPB (premix or compounded)  2 g IntraVENous  ONCE   ??? sodium chloride (NS) flush 5-10 mL  5-10 mL IntraVENous Q8H   ??? sodium chloride (NS) flush 5-10 mL  5-10 mL IntraVENous PRN   ??? naloxone (NARCAN) injection 0.1 mg  0.1 mg IntraVENous PRN   ??? acetaminophen (TYLENOL) tablet 650 mg  650 mg Oral Q6H PRN   ??? albuterol (PROVENTIL VENTOLIN) nebulizer solution 2.5 mg  2.5 mg Nebulization Q6H PRN   ??? enoxaparin (LOVENOX) injection 40 mg  40 mg SubCUTAneous Q24H   ??? influenza vaccine 2019-20 (4 yrs+)(PF) (FLUCELVAX QUAD) injection 0.5 mL  0.5 mL IntraMUSCular PRIOR TO DISCHARGE   ??? dexamethasone (DECADRON) 4 mg/mL injection 4 mg  4 mg IntraVENous Q6H   ??? piperacillin-tazobactam (ZOSYN) 3.375 g in 0.9% sodium chloride (MBP/ADV) 100 mL MBP  3.375 g IntraVENous Q8H   ??? potassium bicarbonate (KLYTE) tablet 25 mEq  25 mEq Oral BID         Intake and Output:  Current Shift:  No intake/output data recorded.  Last three shifts:  09/23 1901 - 09/25 0700  In: 3571.7 [P.O.:220; I.V.:3351.7]  Out: 1250 [Urine:1250]    Lab/Data Reviewed:  All lab and imaging  results for the last 24 hours reviewed.    Stephanie Coup, MD  P# [phone]  August 19, 2018    [facility] medical dictation software  was used for portions of this report.  Unintended voice transcription errors may have occurred.

## 2018-08-19 NOTE — Progress Notes (Signed)
[place]  Oncology Progress Note  NAME:  Cornfield, Sharne  SEX:   F  DOB:   1941-05-28  DATE: 08/19/2018  REFERRING PHYSICIAN:    MR#    [id number]  LOCATION: RADIATION ONCOLOGY  BILLING#  [id number]    cc:     The patient today underwent CT simulation for whole brain fields and lumbar spine fields (her area of greatest pain).  This was discussed in detail with the patient and her daughters.  Appropriate consent forms were signed and CT was done without incident.  She will return later today for first treatment to the brain and spine.      ___________________  Oliver Barre MD  Dictated By: .   [place]  D:08/19/2018 10:57:18  T: 08/19/2018  [id number]

## 2018-08-19 NOTE — Progress Notes (Signed)
OCCUPATIONAL THERAPY EVALUATION     Patient: Kayla [name] (77 y.o. 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 ([place]) [C79.9, C50.919]         Precautions:  Confused and Falls     Ordered Weight Bearing Status: NA      ASSESSMENT :  Based on the objective data described below, the patient presents with   - generalized muscle weakness affecting function in ADLs  - bilateral, lower extremity weakness  - decreased standing balance    - decreased functional mobility   - decreased activity tolerance, increased fatigue and/or shortness of breathe   - decreased tolerance to sustained activity  affecting patient's safety and independence/ability to perform basic ADLs/IADLs    Patient will benefit from skilled occupational therapy intervention to address the above impairments.   Goals x2 week  Pt will be:  Min assist to transfer to bedside commode.  Able to stand at sink with fair balance.  Min assist upper body self care sitting up in chair.  Max assist lower body dressing.       Patient???s rehabilitation potential is considered to be Fair        PLAN :  Planned Interventions: Adaptive equipment, ADI training, activity tolerance, functional balance training, functional mobility training, therapeutic exercise, therapeutic activity, patient/caregiver education and training, home exercise program and neuromuscular re-education.  Frequency/Duration: Patient will be followed by occupational therapy 1-5x/week x 2 weeks to address goals.  Discharge Recommendations: SNF.  Further Equipment Recommendations for Discharge: to be determined, bedside commode, hospital bed, rolling walker     Education / communication:     Barriers to learning/limitations:  yes;  altered mental status (i.e.Sedation, Confusion)  Education provided to: patient on (+) role of OT, (+) OT plan of care, (+)  functional mobility  Educational handouts issued: none this session  Patient / family response to education: needs reinforcement    Patient and/or family have participated as able in goal setting and plan of care.  SUBJECTIVE     Patient Agrees to OT.    OBJECTIVE DATA SUMMARY   Orders, labs, and chart reviewed on [facility]. Discussed with RN.    Patient was admitted to the hospital on 08/17/2018 with   Chief Complaint   Patient presents with   ??? Altered mental status     Present illness history:   Patient Active Problem List    Diagnosis Date Noted   ??? Lactic acidosis 08/17/2018   ??? Altered mental status 08/17/2018   ??? 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 Back pain    ??? Breast cancer ([place])        Patient found: chair, (+) bed/chair alarm      Pain Assessment: None observed and None reported  Pain Location:  na      Prior level of function / living situation:     Information was obtained by:   patient  Home environment:   Patient lives with daugher in house    Comment: Pt wit difficulty reporting details.   ?? Assistance available following hospital stay:  Unknown:   Prior level of function:unknown  Prior level of Instrumental Activities of Daily  Living:   unknown. Pt with difficulty reporting prior level of function.   Home equipment: unknown    Cognitive status:     Mental status:   Orientation: Patient only aware of person  Communication: grossly intact, soft spoken  Attention Span:   fair(15-3min)  Follows commands: able to follow simple 1 step commands, requires cues to stay on task  General cognition: delayed responses, impaired problem solving    Activities of daily living status:     Based on direct observation, simulation and clinical assessment.    Eating:           - minimum assistance, set-up  Grooming:     - moderate assistance  UB bathing:   - moderate assistance  LB bathing:   - maximum assistance   UB dressing: - moderate assistance  LB dressing: - maximum assistance  Toileting:       - maximum assistance    Comment(s):    N/A    Functional mobility and balance status:     Mobility:  Sit to Stand -  maximum assistance  Stand to Sit -  maximum assistance    Transfers:  Not tested    Comment(s):   - max assist due to weakness and decreased motor planning.    Functional balance status:     Balance:  Static Standing Balance -       poor (-):  maximal assistance to maintain balance    Activity Tolerance: observed SOB with activity, observed SOB with standing activity    Upper extremity status:     Dominance:unknown  RIGHT:  ACTIVE range of motion is [facility].  Strength is grossly graded as 3/5: (Able to complete full range of motion against gravity).  Comment: (+)  no c/o paresthesia  LEFT:    ACTIVE range of motion is [facility].  Strength is grossly graded as 3/5: (Able to complete full range of motion against gravity).  Comment:  (+)  no c/o paresthesia    Final position:   Seated in bed side chair, all needs within reach, agrees to call for assistance, (+) bed/chair alarm, nursing staff notified    Evaluation Complexity: History: MEDIUM Complexity : Expanded review of history including physical, cognitive and psychosocial  history ; Examination: HIGH Complexity : 5 or more performance deficits relating to physical, cognitive , or psychosocial skils that result in activity limitations and / or participation restrictions; Decision Making:HIGH Complexity : Patient presents with comorbidities that affect occupational performance. Signifigant modification of tasks or assistance (eg, physical or verbal) with assessment (s) is necessary to enable patient to complete evaluation   Thank you for this referral.    Beverly Gust, OTR/L    TREATMENT     Patient received / participated in 8 minutes of treatment and/or educational instruction during/immediately following OT evaluation.       OBJECTIVE: Pt is agreeable to OT eval and treatment. Up in chair slide down. Mod verbal cues to scoot back. Max assist for sit to stand with max assist. Pt holding onto therapist and max assist from therapist to stand. Able to stand for approx one mins. Placed walker in front of pt to see if she could improve standing stability with RW however decreased motor planning and confusion impairs pt's safe use of RW. RN informed    ASSESSMENT: Pt suspect is not at her baseline.Was very cooperative as able and motivated to mobilize. Should benefit from skilled OT acutely.  PLAN: 1-5x/week      Beverly Gust, OTR/L

## 2018-08-19 NOTE — Progress Notes (Signed)
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 Continue present care.  2. Decadron and XRT, reviewed Dr Callie Fielding notes.  3. Will give bisphosphonate. Will monitor labs.  Recently saw Dr Brent General systemic hormal therapy with faslodex was planned in clinic, updated, overall poor prognosis. Will follow.      Hosie Spangle MD  [facility] [phone]      Subjective:     Patient has no complaints of pain, awake responding appropriately, was not aware of brain mets.     Objective:     Visit Vitals  BP 135/74 (BP 1 Location: Right arm, BP Patient Position: Supine)   Pulse (!) 105   Temp 98.2 ??F (36.8 ??C)   Resp 18   Ht '5\' 6"'$  (1.676 m)   Wt 57.5 kg (126 lb 12.2 oz)   SpO2 99%   BMI 20.46 kg/m??             Temp (24hrs), Avg:97.8 ??F (36.6 ??C), Min:97.3 ??F (36.3 ??C), Max:98.2 ??F (36.8 ??C)        Intake/Output Summary (Last 24 hours) at 08/19/2018 0808  Last data filed at 08/19/2018 1884  Gross per 24 hour   Intake 2471.67 ml   Output 1150 ml   Net 1321.67 ml       Current Facility-Administered Medications   Medication Dose Route Frequency   ??? sodium chloride (NS) flush 5-10 mL  5-10 mL IntraVENous Q8H   ??? sodium chloride (NS) flush 5-10 mL  5-10 mL IntraVENous PRN   ??? naloxone (NARCAN) injection 0.1 mg  0.1 mg IntraVENous PRN   ??? acetaminophen (TYLENOL) tablet 650 mg  650 mg Oral Q6H PRN   ??? albuterol (PROVENTIL VENTOLIN) nebulizer solution 2.5 mg  2.5 mg Nebulization Q6H PRN    ??? enoxaparin (LOVENOX) injection 40 mg  40 mg SubCUTAneous Q24H   ??? influenza vaccine 2019-20 (4 yrs+)(PF) (FLUCELVAX QUAD) injection 0.5 mL  0.5 mL IntraMUSCular PRIOR TO DISCHARGE   ??? dexamethasone (DECADRON) 4 mg/mL injection 4 mg  4 mg IntraVENous Q6H   ??? piperacillin-tazobactam (ZOSYN) 3.375 g in 0.9% sodium chloride (MBP/ADV) 100 mL MBP  3.375 g IntraVENous Q8H   ??? potassium bicarbonate (KLYTE) tablet 25 mEq  25 mEq Oral BID       Review of Systems  Constitutional:   Fever: Negative, Chills: Negative Malaise/Fatigue:present   Diaphoresis: Negative  Skin:   Rash: Negative,  Itching: Negative  HENT:   Headache:Negative Nosebleeds: Negative, Stridor: Negative, Sore Throat: Negative  Eyes: Jaudice: negative Blurred Vision: Negative, Double Vision: Negative Eye Redness: Negative  Cardiovascular:    Chest Pain: Negative, Palpitations: Negative, Orthopnea: Negative    Leg Swelling: Negative   Respiratory:   Cough: Negative, Hemoptysis: Negative   Shortness of Breath: Negative, Wheezing: Negative  Gastrointestinal:  Nausea: Negative, Vomiting: Negative   Abdominal  Pain: Negative, Diarrhea: Negative, Constipation: Negative   Blood in Stool: Negative  Genitourinary:    Dysuria: Negative, Urgency: Negative, Frequency: Negative   Hematuria: Negative, Flank Pain: Negative  Musculoskeletal:    Myalgias: Negative, Neck Pain: Negative, Back Pain: Negative   Joint Pain: Negative, Falls: Negative  Neurological:    Dizziness: Negative, Tingling: Negative.  Speech Change: Negative, Focal Weakness: Negative Seizures: Negative, LOC: Negative    Physical Exam:    Constitutional: Frail, cachectic, no acute distress and alert and oriented   HENT: Oral mucosa dry.   Eyes: conjunctiva normal, PERLA.   Neck: No LAD or jugular venous distension.   Cardiovascular: heart sounds normal, normal rate and regular rhythm.     Pulmonary/Chest Wall: breath sounds are clear bilaterally.    Abdominal: Non tender, no palpable masses, no hepatosplenomegaly    Musculoskeletal: Normal muscle strength and tone.   Skin: Normal and no rashes or lesions   CNS No focal deficits.   Peripheral Vascular: No edema present in extremities.         Labs:  Recent Results (from the past 24 hour(s))   CBC WITH AUTOMATED DIFF    Collection Time: 08/19/18  6:06 AM   Result Value Ref Range    WBC 6.3 4.0 - 11.0 1000/mm3    RBC 3.91 3.60 - 5.20 M/uL    HGB 11.2 (L) 13.0 - 17.2 gm/dl    HCT 35.1 (L) 37.0 - 50.0 %    MCV 89.8 80.0 - 98.0 fL    MCH 28.6 25.4 - 34.6 pg    MCHC 31.9 30.0 - 36.0 gm/dl    PLATELET 259 140 - 450 1000/mm3    MPV 10.7 (H) 6.0 - 10.0 fL    RDW-SD 55.8 (H) 36.4 - 46.3      NRBC 0 0 - 0      IMMATURE GRANULOCYTES 0.8 0.0 - 3.0 %    NEUTROPHILS 81.4 (H) 34 - 64 %    LYMPHOCYTES 12.7 (L) 28 - 48 %    MONOCYTES 4.6 1 - 13 %    EOSINOPHILS 0.2 0 - 5 %    BASOPHILS 0.3 0 - 3 %   METABOLIC PANEL, COMPREHENSIVE    Collection Time: 08/19/18  6:06 AM   Result Value Ref Range    Sodium 138 136 - 145 mEq/L    Potassium 3.6 3.5 - 5.1 mEq/L    Chloride 102 98 - 107 mEq/L    CO2 26 21 - 32 mEq/L    Glucose 128 (H) 74 - 106 mg/dl    BUN 11 7 - 25 mg/dl    Creatinine 0.6 0.6 - 1.3 mg/dl    GFR est AA >60.0      GFR est non-AA >60      Calcium 10.5 (H) 8.5 - 10.1 mg/dl    AST (SGOT) 491 (H) 15 - 37 U/L    ALT (SGPT) 61 12 - 78 U/L    Alk. phosphatase 863 (H) 45 - 117 U/L    Bilirubin, total 1.7 (H) 0.2 - 1.0 mg/dl    Protein, total 6.7 6.4 - 8.2 gm/dl    Albumin 2.3 (L) 3.4 - 5.0 gm/dl    Anion gap 10 5 - 15 mmol/L   MAGNESIUM    Collection Time: 08/19/18  6:06 AM   Result Value Ref Range    Magnesium 1.5 (L) 1.6 - 2.6 mg/dl   PHOSPHORUS    Collection Time: 08/19/18  6:06 AM  Result Value Ref Range    Phosphorus 2.9 2.5 - 4.9 mg/dl       Radiology:    Xr Chest Sngl V    Result Date: 08/17/2018  IMPRESSION: Edema versus multifocal pneumonia or chronic changes  bilaterally. Cardiomegaly. Postsurgical changes cervical spine.     Ct Head Wo Cont    Result Date: 08/17/2018  Impression: cerebral atrophy and nonspecific white matter changes not unusual for age; multiple lesions as described in brain and calvarium worrisome for metastatic disease on this noncontrast study. Contrast MRI suggested for further evaluation.     Cta Chest W Or W Wo Cont    Result Date: 08/17/2018  Impression: 1. No evidence of pulmonary embolus or aortic dissection. 2. 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 3. Indeterminate lesion right thyroid lobe which could be evaluated with ultrasound. 4. Cardiomegaly. 5. Bilateral small pleural effusions. 6. Multiple lesions throughout lungs and bones consistent with metastatic disease possibly from breast primary with asymmetric multi centric lesions throughout the right breast and overlying skin thickening with asymmetric upper normal size right axillary nodes. 7. Compression fractures T3 and T4 with retropulsion. 8. Heterogeneous liver. Metastatic disease not excluded.     Ct Abd Pelv W Cont    Result Date: 08/17/2018  IMPRESSION: 1. 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 Compression fractures L2 through L4. Small acute fracture suspected at superior lateral right acetabular margin. 2. Indeterminate right renal lesion likely represent cyst which could be confirmed with ultrasound. 3. Left hip replacement. 4. Uterus atrophic or removed. 5. Atherosclerotic vascular disease. 6. Constipation.

## 2018-08-19 NOTE — Progress Notes (Signed)
[facility]  Oncology Progress Note  NAME:  Lemanski, Landen  SEX:   F  DOB:   Jan 29, 1941  DATE: 08/19/2018  REFERRING PHYSICIAN:    MR#    [id number]  LOCATION: [facility]Y  BILLING#  [id number]    cc:     The patient today underwent her first treatment to the whole brain; 300 cGy was delivered of a planned 10 treatments.  Attempt was made to plan lumbar spine fields but patient was unable to tolerate lying on the table.  If she is more comfortable tomorrow, this will be planned at that time.  Treatment to the brain will continue as planned.      ___________________  Oliver Barre MD  Dictated By: .   MLT  D:08/19/2018 15:47:54  T: 08/19/2018  [id number]

## 2018-08-19 NOTE — Progress Notes (Signed)
Patient admitted on 08/17/2018 from home with   Chief Complaint   Patient presents with   ??? Altered mental status        The patient has been admitted to the hospital 0 times in the past 12 months.        Tentative dc plan:    She lives in home with her dtr Juliann Pulse. PT OT to see and dtr would like SNF for rehab prior to return home. Asked SW for [facility] and PT OT to see    Facility if plan     Anticipated Discharge Date:   9.27    Would you like any one involved in your discharge plan ? dtr Juliann Pulse    PCP: Henrene Pastor, MD         Face sheet information, address, contact info and insurance verified yes    Dialysis Unit/ chair time / access:    n/a        DME at home cane walker and w/c    Home Environment:    Lives at [address]  [place] [address]    @[email or web address] @.     Prior to admission open services:     none    [place]-    n.a    Extended Emergency Contact Information  Primary Emergency Contact: Roz[place], [place] Phone: [phone]  Relation: Daughter  Secondary Emergency Contact: [place], [place] Phone: [phone]  Relation: Daughter      Transportation:     bls will transport home    Therapy Recommendations:  OT :y  To see pt see in epic  PT :y/to see pt see in epic      Case Management Assessment                          PRIMARY DECISION MAKER    dtr Juliann Pulse                               CARE MANAGEMENT INTERVENTIONS   Readmission Interview Completed: Not Applicable               Mode of Transport at Discharge: BLS       Transition of Care Consult (CM Consult): SNF, Discharge Planning                   Physical Therapy Consult: Yes   Occupational Therapy Consult: Yes       Current Support Network: Relative's Home, Family Lives Nearby(lives with dtr )   Reason for Referral: DCP Rounds   History Provided By: Child/Family, Medical Record, Patient   Patient Orientation: Unable to Assess   Cognition: Other (see comment)(AMS)    Support System Response: Concerned, Cooperative   Previous Living Arrangement: Lives with Family Dependent   Home Accessibility: Steps, Multi Level Home(5/20 bedroom is upstairs)   Prior Functional Level: Assistance with the following:, Bathing, Dressing, Housework, Cooking, Mobility, Shopping   Current Functional Level: Assistance with the following:, Bathing, Cooking, Mobility, Shopping       Can patient return to prior living arrangement: Yes   Ability to make needs known:: Fair   Family able to assist with home care needs:: Yes(but dtr travels)           Education officer, museum Referral: [facility]/96   Types of Needs  Identified: Disease Management Education, Treatment Education, ADLs/IADLs, Activity/Exercise   Anticipated Discharge Needs: Transportation, Agenda discussed with Pt/Family/Caregiver: Yes   Freedom of Choice Offered: Yes      DISCHARGE LOCATION   Discharge Placement: Skilled nursing facility

## 2018-08-19 NOTE — Progress Notes (Signed)
physical Therapy treatment    Patient: Kayla [name] (77 y.o. 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  Weight bearing precautions: None      ASSESSMENT :  Based on the objective data described below, the patient presents with  Able to participate with LE exercises and stand, limited by confusion today  Generalized muscle weakness affecting function   Decreased Strength  Decreased ADL/Functional Activities  Decreased Transfer Abilities  Decreased Ambulation Ability/Technique  Decreased Balance  Increased Pain  Decreased Activity Tolerance.      Patient???s rehabilitation potential is considered to be Good     Recommendations:  Physical Therapy  Discharge Recommendations: SNF, 24 hour supervision for safety, Home with home health PT and TBD, pending progress  Further Equipment Recommendations for Discharge: None        PLAN :  Planned Interventions:  Functional mobility training Gait Training Stair training Balance Training Therapeutic exercises Therapeutic activities Neuro muscular re-education AD training Patient/caregiver education      Frequency/Duration: Patient will be followed by physical therapy 3x / Week x4weeks to address goals.                 Orders reviewed, chart reviewed on [facility].    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 Back pain     ??? Breast cancer [facility])          Patient found: Bedside chair, IV and no chair alarm.    Pain Assessment before PT session: Not rated  Pain Location: all over per pt  Pain Assessment after PT session: Not rated  Pain Location:    []           Yes, patient had pain medications  []           No, Patient has not had pain medications  []           Nurse notified    COGNITIVE STATUS:     Mental Status: Oriented to, person, place and confused.  Communication: normal.  Follows commands: intact.  General Cognition: impaired safety and impulsivity.  Hearing: grossly intact.  Vision:  grossly intact.            Functional mobility and balance status:     Sit to Stand -  mod. assist, max. assist, verbal cues, safety concerns, increased time and 1 person  Stand to Sit -  mod. assist, max. assist, verbal cues, manual cues, visual cues, set-up, safety concerns, increased time and 1 person   Unable to ambulate today, pt limited by cognition        Ambulation/Gait Training:  unable  Lower Extremities:  Seated, Glut Set, Hamstring Set, Heel Slide, Long arc quad, Straight leg raise, Hip ab/duction, Ankle pumps, BLE, Assisted, 10 reps and Cueing        Activity Tolerance:   fair    Final Location:   bedside chair, chair alarm, all needs close, agrees to call for assistance and nurse notified     COMMUNICATION/EDUCATION:   Education: Patient, Benefit of activity while hospitalized, Call for assistance, Out of bed 2-3 times/day, Staff assistance with mobility, Changes positions frequently, Sit out of bed for 45-60 minutes or as tolerated, Safety, Functional mobility, Demonstrates adequately, Verbalized understanding, Teaching method, Verbal and Role of PT  Barriers to Learning/Limitations: None    Please refer to care plan and patient education section for further details.    Thank you for this referral.  Luan Pulling, PT, DPT  Pager [phone]

## 2018-08-19 NOTE — Progress Notes (Signed)
Pt repeatedly trying to climb out of bed, put on avasys telesitter.

## 2018-08-19 NOTE — Other (Signed)
Bedside and Verbal shift change report given to [place]   (Soil scientist) by Olegario Shearer, RN (offgoing nurse). Report included the following information SBAR.

## 2018-08-19 NOTE — Progress Notes (Signed)
SW received a consult to complete a [facility] for possible snf placement.  SW attempted to speak with the pt at the bed side, however pt was asleep and was not easily awaken.  SW spoke w/ pt's daughter, Kayla [name] and explained the reason for the contact.  Kayla [name] reports the pt resides with her and she takes care of the pt.  She also said that she hired a personal care partner to car for the pt while she works or go out of town.  She said the pt has [place] Medicaid and she is in the mist of converting the this services to [facility].  She wants the pt to come home with personal care through [facility].  SW explained that Medicaid most likely will only cover 5 days of week ad 5 hrs worth of care in the home.  Kayla [name] reports that she will continue to also paid their personal care giver to remain in the home alone with the Medical CP.    SW inquired if the pt will go to a snf for a short stay and then return home.  Kayla [name] said she will first discuss this with the pt and get back with this SW.  Kayla [name] wants a list of the Medicaid approved CP agencies.  SW made her aware that the list alone with the DMAS 97 will be left in pt's room on the white board.  CM made aware vis email.

## 2018-08-19 NOTE — Progress Notes (Signed)
PAGER ID: [phone]   MESSAGE: [name], Kayla. Increasingly agitated and restless. Family and AVASYS at bedside and no resolution. May we have PRN Haldol order? Thanks! [place], [place]

## 2018-08-19 NOTE — Other (Signed)
Bedside and Verbal shift change report given to [place] (oncoming nurse) by Karle Barr (offgoing nurse). Report included the following information SBAR and Kardex.

## 2018-08-19 NOTE — Progress Notes (Signed)
 OCCUPATIONAL THERAPY EVALUATION     Patient: Kayla [name] (77 y.o. 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 ([facility]) [C79.9, C50.919]         Precautions:  Confused and Falls     Ordered Weight Bearing Status: NA      ASSESSMENT :  Based on the objective data described below, the patient presents with   - generalized muscle weakness affecting function in ADLs  - bilateral, lower extremity weakness  - decreased standing balance    - decreased functional mobility   - decreased activity tolerance, increased fatigue and/or shortness of breathe   - decreased tolerance to sustained activity  affecting patient's safety and independence/ability to perform basic ADLs/IADLs    Patient will benefit from skilled occupational therapy intervention to address the above impairments.   Goals x2 week  Pt will be:  Min assist to transfer to bedside commode.  Able to stand at sink with fair balance.  Min assist upper body self care sitting up in chair.  Max assist lower body dressing.       Patient's rehabilitation potential is considered to be Fair        PLAN :  Planned Interventions: Adaptive equipment, ADI training, activity tolerance, functional balance training, functional mobility training, therapeutic exercise, therapeutic activity, patient/caregiver education and training, home exercise program and neuromuscular re-education.  Frequency/Duration: Patient will be followed by occupational therapy 1-5x/week x 2 weeks to address goals.  Discharge Recommendations: SNF.  Further Equipment Recommendations for Discharge: to be determined, bedside commode, hospital bed, rolling walker     Education / communication:     Barriers to learning/limitations:  yes;  altered mental status (i.e.Sedation, Confusion)  Education provided to: patient on (+) role of OT, (+) OT plan of care, (+)  functional mobility  Educational handouts issued: none this session  Patient / family response to education: needs reinforcement    Patient and/or family have participated as able in goal setting and plan of care.  SUBJECTIVE     Patient Agrees to OT.    OBJECTIVE DATA SUMMARY   Orders, labs, and chart reviewed on [facility]. Discussed with RN.    Patient was admitted to the hospital on 08/17/2018 with   Chief Complaint   Patient presents with   . Altered mental status     Present illness history:   Patient Active Problem List    Diagnosis Date Noted   . Lactic acidosis 08/17/2018   . Altered mental status 08/17/2018   . 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 Back pain    . Breast cancer ([facility])        Patient found: chair, (+) bed/chair alarm      Pain Assessment: None observed and None reported  Pain Location:  na      Prior level of function / living situation:     Information was obtained by:   patient  Home environment:   Patient lives with daugher in house    Comment: Pt wit difficulty reporting details.    Assistance available following hospital stay:  Unknown:   Prior level of function:unknown  Prior level of Instrumental Activities of Daily  Living:   unknown. Pt with difficulty reporting prior level of function.   Home equipment: unknown    Cognitive status:     Mental status:   Orientation: Patient only aware of person  Communication: grossly intact, soft spoken  Attention Span:   fair(15-62min)  Follows commands: able to follow simple 1 step commands, requires cues to stay on task  General cognition: delayed responses, impaired problem solving    Activities of daily living status:     Based on direct observation, simulation and clinical assessment.    Eating:           - minimum assistance, set-up  Grooming:     - moderate assistance  UB bathing:   - moderate assistance  LB bathing:   - maximum assistance  UB dressing:  - moderate assistance  LB dressing: - maximum assistance  Toileting:       - maximum assistance    Comment(s):    N/A    Functional mobility and balance status:     Mobility:  Sit to Stand -  maximum assistance  Stand to Sit -  maximum assistance    Transfers:  Not tested    Comment(s):   - max assist due to weakness and decreased motor planning.    Functional balance status:     Balance:  Static Standing Balance -       poor (-):  maximal assistance to maintain balance    Activity Tolerance: observed SOB with activity, observed SOB with standing activity    Upper extremity status:     Dominance:unknown  RIGHT:  ACTIVE range of motion is [facility].  Strength is grossly graded as 3/5: (Able to complete full range of motion against gravity).  Comment: (+)  no c/o paresthesia  LEFT:    ACTIVE range of motion is [facility].  Strength is grossly graded as 3/5: (Able to complete full range of motion against gravity).  Comment:  (+)  no c/o paresthesia    Final position:   Seated in bed side chair, all needs within reach, agrees to call for assistance, (+) bed/chair alarm, nursing staff notified    Evaluation Complexity: History: MEDIUM Complexity : Expanded review of history including physical, cognitive and psychosocial  history ; Examination: HIGH Complexity : 5 or more performance deficits relating to physical, cognitive , or psychosocial skils that result in activity limitations and / or participation restrictions; Decision Making:HIGH Complexity : Patient presents with comorbidities that affect occupational performance. Signifigant modification of tasks or assistance (eg, physical or verbal) with assessment (s) is necessary to enable patient to complete evaluation   Thank you for this referral.    Inocente Mable Bonine, OTR/L    TREATMENT     Patient received / participated in 8 minutes of treatment and/or educational instruction during/immediately following OT evaluation.      OBJECTIVE: Pt is agreeable to OT eval and  treatment. Up in chair slide down. Mod verbal cues to scoot back. Max assist for sit to stand with max assist. Pt holding onto therapist and max assist from therapist to stand. Able to stand for approx one mins. Placed walker in front of pt to see if she could improve standing stability with RW however decreased motor planning and confusion impairs pt's safe use of RW. RN informed    ASSESSMENT: Pt suspect is not at her baseline.Was very cooperative as able and motivated to mobilize. Should benefit from skilled OT acutely.  PLAN: 1-5x/week      Inocente Mable Bonine, OTR/L

## 2018-08-19 NOTE — Progress Notes (Signed)
 Patient admitted on 08/17/2018 from home with   Chief Complaint   Patient presents with   . Altered mental status        The patient has been admitted to the hospital 0 times in the past 12 months.        Tentative dc plan:    She lives in home with her dtr Nathanel. PT OT to see and dtr would like SNF for rehab prior to return home. Asked SW for [facility] and PT OT to see    Facility if plan     Anticipated Discharge Date:   9.27    Would you like any one involved in your discharge plan ? dtr nathanel    PCP: Danso, Michael A, MD         Face sheet information, address, contact info and insurance verified yes    Dialysis Unit/ chair time / access:    n/a        DME at home cane walker and w/c    Home Environment:    Lives at [address]  [place] [address]    [email or web address].     Prior to admission open services:     none    Home Health Agency-     n.a    Personal Care Agency-    n.a    Extended Emergency Contact Information  Primary Emergency ContactBETHA COSME NATHANEL  Home Phone: [phone]  Relation: Daughter  Secondary Emergency Contact: ARLYSS STARCHER  Home Phone: [phone]  Relation: Daughter      Transportation:     bls will transport home    Therapy Recommendations:  OT :y  To see pt see in epic  PT :y/to see pt see in epic      Case Management Assessment                          PRIMARY DECISION MAKER    dtr nathanel                               CARE MANAGEMENT INTERVENTIONS   Readmission Interview Completed: Not Applicable               Mode of Transport at Discharge: BLS       Transition of Care Consult (CM Consult): SNF, Discharge Planning                   Physical Therapy Consult: Yes   Occupational Therapy Consult: Yes       Current Support Network: Relative's Home, Family Lives Nearby(lives with dtr )   Reason for Referral: DCP Rounds   History Provided By: Child/Family, Medical Record, Patient   Patient Orientation: Unable to Assess   Cognition: Other (see comment)(AMS)   Support System Response: Concerned,  Cooperative   Previous Living Arrangement: Lives with Family Dependent   Home Accessibility: Steps, Multi Level Home(5/20 bedroom is upstairs)   Prior Functional Level: Assistance with the following:, Bathing, Dressing, Housework, Cooking, Mobility, Shopping   Current Functional Level: Assistance with the following:, Bathing, Cooking, Mobility, Shopping       Can patient return to prior living arrangement: Yes   Ability to make needs known:: Fair   Family able to assist with home care needs:: Yes(but dtr travels)           Child psychotherapist Referral: [facility]/96   Types of Needs  Identified: Disease Management Education, Treatment Education, ADLs/IADLs, Activity/Exercise   Anticipated Discharge Needs: Transportation, Skilled Nursing Facility           Plan discussed with Pt/Family/Caregiver: Yes   Freedom of Choice Offered: Yes      DISCHARGE LOCATION   Discharge Placement: Skilled nursing facility

## 2018-08-19 NOTE — Progress Notes (Signed)
Medicine Progress Note    Patient: Kayla [name]   Age:  77 y.o.  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  Monitor off antibiotics  -Decadron IV for brain mass with vasogenic cerebral edema  -Radiation oncology consult appreciated patient down for simulation today  -Oncology Dr. Rexford Maus appreciated  -Discussed with daughter Kayla [name] on phone at [phone], discussed mother's advanced cancer and poor prognosis on 08/18/2018.  -Discussed DNR/DNI, at this point they wish all aggressive measures, full care.     Subjective:   Patient seen and examined. No complaints, No N/V, F/C, CP or SOB      Objective:     Visit Vitals  BP 142/79 (BP Patient Position: Supine)   Pulse (!) 107   Temp (!) 80.3 ??F (26.8 ??C)   Resp 18   Ht 5\' 6"  (1.676 m)   Wt 57.5 kg (126 lb 12.2 oz)   SpO2 100%   BMI 20.46 kg/m??       Physical Exam:  General appearance: alert, cooperative, thin and frail appearing  Head: Normocephalic, bitemporal wasting  Neck: supple, trachea midline  Lungs: clear to auscultation bilaterally  Heart: regular rate and rhythm, S1, S2 normal, no murmur, click, rub or gallop  Abdomen: soft, non-tender. Bowel sounds normal. No masses,  no organomegaly  Extremities: extremities normal, atraumatic, no cyanosis or edema  Skin: Skin color, texture, turgor normal. No rashes or lesions in exposed areas       Medications Reviewed:  Current Facility-Administered Medications   Medication Dose Route Frequency   ??? pamidronate (AREDIA) 90 mg in 0.9% sodium chloride 250 mL infusion  90 mg IntraVENous ONCE   ??? magnesium sulfate 2 g/50 ml IVPB (premix or compounded)  2 g IntraVENous  ONCE   ??? sodium chloride (NS) flush 5-10 mL  5-10 mL IntraVENous Q8H   ??? sodium chloride (NS) flush 5-10 mL  5-10 mL IntraVENous PRN   ??? naloxone (NARCAN) injection 0.1 mg  0.1 mg IntraVENous PRN   ??? acetaminophen (TYLENOL) tablet 650 mg  650 mg Oral Q6H PRN   ??? albuterol (PROVENTIL VENTOLIN) nebulizer solution 2.5 mg  2.5 mg Nebulization Q6H PRN   ??? enoxaparin (LOVENOX) injection 40 mg  40 mg SubCUTAneous Q24H   ??? influenza vaccine 2019-20 (4 yrs+)(PF) (FLUCELVAX QUAD) injection 0.5 mL  0.5 mL IntraMUSCular PRIOR TO DISCHARGE   ??? dexamethasone (DECADRON) 4 mg/mL injection 4 mg  4 mg IntraVENous Q6H   ??? piperacillin-tazobactam (ZOSYN) 3.375 g in 0.9% sodium chloride (MBP/ADV) 100 mL MBP  3.375 g IntraVENous Q8H   ??? potassium bicarbonate (KLYTE) tablet 25 mEq  25 mEq Oral BID         Intake and Output:  Current Shift:  No intake/output data recorded.  Last three shifts:  09/23 1901 - 09/25 0700  In: 3571.7 [P.O.:220; I.V.:3351.7]  Out: 1250 [Urine:1250]    Lab/Data Reviewed:  All lab and imaging  results for the last 24 hours reviewed.    Stephanie Coup, MD  P# [phone]  August 19, 2018    [facility] medical dictation software  was used for portions of this report.  Unintended voice transcription errors may have occurred.

## 2018-08-19 NOTE — Progress Notes (Signed)
[facility]  Oncology Progress Note  NAME:  Kayla [name], Kayla [name]  SEX:   F  DOB:   1941-09-29  DATE: 08/19/2018  REFERRING PHYSICIAN:    MR#    [id number]  LOCATION: RADIATION ONCOLOGY  BILLING#  [id number]    cc:    The patient today underwent her first treatment to the whole brain; 300 cGy was delivered of a planned 10 treatments.  Attempt was made to plan lumbar spine fields but patient was unable to tolerate lying on the table.  If she is more comfortable tomorrow, this will be planned at that time.  Treatment to the brain will continue as planned.      ___________________  Oliver Barre MD  Dictated By: .   MLT  D:08/19/2018 15:47:54  T: 08/19/2018  [id number]

## 2018-08-19 NOTE — Progress Notes (Signed)
 physical Therapy treatment    Patient: Kayla [name] (77 y.o. 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  Weight bearing precautions: None      ASSESSMENT :  Based on the objective data described below, the patient presents with  Able to participate with LE exercises and stand, limited by confusion today  Generalized muscle weakness affecting function   Decreased Strength  Decreased ADL/Functional Activities  Decreased Transfer Abilities  Decreased Ambulation Ability/Technique  Decreased Balance  Increased Pain  Decreased Activity Tolerance.      Patient's rehabilitation potential is considered to be Good     Recommendations:  Physical Therapy  Discharge Recommendations: SNF, 24 hour supervision for safety, Home with home health PT and TBD, pending progress  Further Equipment Recommendations for Discharge: None        PLAN :  Planned Interventions:  Functional mobility training Gait Training Stair training Balance Training Therapeutic exercises Therapeutic activities Neuro muscular re-education AD training Patient/caregiver education      Frequency/Duration: Patient will be followed by physical therapy 3x / Week x4weeks to address goals.                 Orders reviewed, chart reviewed on [facility].    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 Back pain    . Breast  cancer [facility])          Patient found: Bedside chair, IV and no chair alarm.    Pain Assessment before PT session: Not rated  Pain Location: all over per pt  Pain Assessment after PT session: Not rated  Pain Location:    []           Yes, patient had pain medications  []           No, Patient has not had pain medications  []           Nurse notified    COGNITIVE STATUS:     Mental Status: Oriented to, person, place and confused.  Communication: normal.  Follows commands: intact.  General Cognition: impaired safety and impulsivity.  Hearing: grossly intact.  Vision:  grossly intact.            Functional mobility and balance status:     Sit to Stand -  mod. assist, max. assist, verbal cues, safety concerns, increased time and 1 person  Stand to Sit -  mod. assist, max. assist, verbal cues, manual cues, visual cues, set-up, safety concerns, increased time and 1 person   Unable to ambulate today, pt limited by cognition        Ambulation/Gait Training:  unable  Lower Extremities:  Seated, Glut Set, Hamstring Set, Heel Slide, Long arc quad, Straight leg raise, Hip ab/duction, Ankle pumps, BLE, Assisted, 10 reps and Cueing        Activity Tolerance:   fair    Final Location:   bedside chair, chair alarm, all needs close, agrees to call for assistance and nurse notified     COMMUNICATION/EDUCATION:   Education: Patient, Benefit of activity while hospitalized, Call for assistance, Out of bed 2-3 times/day, Staff assistance with mobility, Changes positions frequently, Sit out of bed for 45-60 minutes or as tolerated, Safety, Functional mobility, Demonstrates adequately, Verbalized understanding, Teaching method, Verbal and Role of PT  Barriers to Learning/Limitations: None    Please refer to care plan and patient education section for further details.    Thank you for this referral.  Cleatus Lemond Molt, PT, DPT  Pager [phone]

## 2018-08-19 NOTE — Progress Notes (Signed)
[facility]  Oncology Progress Note  NAME:  Kayla [name], Kayla [name]  SEX:   F  DOB:   07/19/1941  DATE: 08/19/2018  REFERRING PHYSICIAN:    MR#    [id number]  LOCATION: RADIATION ONCOLOGY  BILLING#  [id number]    cc:    The patient today underwent CT simulation for whole brain fields and lumbar spine fields (her area of greatest pain).  This was discussed in detail with the patient and her daughters.  Appropriate consent forms were signed and CT was done without incident.  She will return later today for first treatment to the brain and spine.      ___________________  Oliver Barre MD  Dictated By: .   [place]  D:08/19/2018 10:57:18  T: 08/19/2018  [id number]

## 2018-08-19 NOTE — Progress Notes (Signed)
Progress Notes by Matilde Bash, MD at 08/19/18 [phone]                Author: Matilde Bash, MD  Service: ONCOLOGY  Author Type: Physician       Filed: 08/19/18 0821  Date of Service: 08/19/18 0808  Status: Signed          Editor: Matilde Bash, MD (Physician)                     Hematology / Oncology Progress Note   [facility]        Patient: Kayla [name]  MRN:  [id number]         CSN: [id number]      Date of Birth: [birth year]        Admit Date: 08/17/2018        Assessment:        Active Problems:     Lactic acidosis (08/17/2018)        Altered mental status (08/17/2018)        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  Continue present care.   2. Decadron and XRT, reviewed Dr Callie Fielding notes.   3. Will give bisphosphonate. Will monitor labs.   Recently saw Dr Brent General systemic hormal therapy with faslodex was planned in clinic, updated, overall poor prognosis. Will follow.         Hosie Spangle MD   [facility] [phone]           Subjective:        Patient has no complaints of pain, awake responding appropriately, was not aware of brain mets.         Objective:        Visit Vitals      BP  135/74 (BP 1 Location: Right arm, BP Patient Position: Supine)     Pulse  (!) 105     Temp  98.2 ??F (36.8 ??C)     Resp  18     Ht  '5\' 6"'$  (1.676 m)     Wt  57.5 kg (126 lb 12.2 oz)     SpO2  99%        BMI  20.46 kg/m??                 Temp (24hrs), Avg:97.8 ??F (36.6 ??C), Min:97.3 ??F (36.3 ??C), Max:98.2 ??F (36.8 ??C)            Intake/Output Summary (Last 24 hours) at 08/19/2018 0808   Last data filed at 08/19/2018 7425     Gross per 24 hour        Intake  2471.67 ml        Output  1150 ml        Net  1321.67 ml             Current Facility-Administered Medications           Medication  Dose  Route  Frequency           ?  sodium chloride (NS) flush 5-10 mL   5-10 mL  IntraVENous  Q8H     ?  sodium chloride (NS)  flush 5-10 mL   5-10 mL  IntraVENous  PRN     ?  naloxone (NARCAN) injection 0.1 mg   0.1 mg  IntraVENous  PRN     ?  acetaminophen (TYLENOL) tablet 650 mg   650 mg  Oral  Q6H PRN     ?  albuterol (PROVENTIL VENTOLIN) nebulizer solution 2.5 mg   2.5 mg  Nebulization  Q6H PRN     ?  enoxaparin (LOVENOX) injection 40 mg   40 mg  SubCUTAneous  Q24H     ?  influenza vaccine 2019-20 (4 yrs+)(PF) (FLUCELVAX QUAD) injection 0.5 mL   0.5 mL  IntraMUSCular  PRIOR TO DISCHARGE     ?  dexamethasone (DECADRON) 4 mg/mL injection 4 mg   4 mg  IntraVENous  Q6H           ?  piperacillin-tazobactam (ZOSYN) 3.375 g in 0.9% sodium chloride (MBP/ADV) 100 mL MBP   3.375 g  IntraVENous  Q8H           ?  potassium bicarbonate (KLYTE) tablet 25 mEq   25 mEq  Oral  BID           Review of Systems   Constitutional:    Fever: Negative, Chills:  Negative Malaise/Fatigue:present    Diaphoresis: Negative   Skin:    Rash: Negative,  Itching:  Negative   HENT:    Headache:Negative Nosebleeds:  Negative, Stridor: Negative, Sore Throat:  Negative   Eyes: Jaudice:  negative Blurred Vision: Negative, Double Vision:  Negative Eye Redness: Negative   Cardiovascular:     Chest Pain: Negative,  Palpitations: Negative, Orthopnea: Negative     Leg Swelling: Negative    Respiratory:    Cough: Negative, Hemoptysis:  Negative    Shortness of Breath: Negative, Wheezing:  Negative   Gastrointestinal:   Nausea: Negative, Vomiting:  Negative    Abdominal Pain: Negative, Diarrhea:  Negative, Constipation: Negative    Blood in Stool: Negative   Genitourinary:     Dysuria: Negative, Urgency:  Negative, Frequency: Negative    Hematuria: Negative, Flank Pain:  Negative   Musculoskeletal:     Myalgias: Negative, Neck  Pain: Negative, Back Pain: Negative    Joint Pain: Negative, Falls:  Negative   Neurological:     Dizziness:  Negative, Tingling:  Negative.   Speech Change: Negative, Focal Weakness:  Negative Seizures: Negative, LOC:  Negative      Physical Exam:         Constitutional:  Frail, cachectic, no acute distress and alert and oriented        HENT:  Oral mucosa dry.        Eyes:  conjunctiva normal, PERLA.        Neck:  No LAD or jugular venous distension.        Cardiovascular:  heart sounds normal, normal rate and regular rhythm.          Pulmonary/Chest Wall:  breath sounds are clear bilaterally.        Abdominal:  Non tender, no palpable masses, no hepatosplenomegaly         Musculoskeletal:  Normal muscle strength and tone.        Skin:  Normal and no rashes or lesions        CNS  No focal deficits.     Peripheral Vascular:  No edema present in extremities.             Labs:  Recent Results (from the past 24 hour(s))     CBC WITH AUTOMATED DIFF          Collection Time: 08/19/18  6:06 AM         Result  Value  Ref Range            WBC  6.3  4.0 - 11.0 1000/mm3       RBC  3.91  3.60 - 5.20 M/uL       HGB  11.2 (L)  13.0 - 17.2 gm/dl       HCT  35.1 (L)  37.0 - 50.0 %       MCV  89.8  80.0 - 98.0 fL       MCH  28.6  25.4 - 34.6 pg       MCHC  31.9  30.0 - 36.0 gm/dl       PLATELET  259  140 - 450 1000/mm3       MPV  10.7 (H)  6.0 - 10.0 fL       RDW-SD  55.8 (H)  36.4 - 46.3         NRBC  0  0 - 0         IMMATURE GRANULOCYTES  0.8  0.0 - 3.0 %       NEUTROPHILS  81.4 (H)  34 - 64 %       LYMPHOCYTES  12.7 (L)  28 - 48 %       MONOCYTES  4.6  1 - 13 %       EOSINOPHILS  0.2  0 - 5 %       BASOPHILS  0.3  0 - 3 %       METABOLIC PANEL, COMPREHENSIVE          Collection Time: 08/19/18  6:06 AM         Result  Value  Ref Range            Sodium  138  136 - 145 mEq/L       Potassium  3.6  3.5 - 5.1 mEq/L       Chloride  102  98 - 107 mEq/L       CO2  26  21 - 32 mEq/L       Glucose  128 (H)  74 - 106 mg/dl       BUN  11  7 - 25 mg/dl       Creatinine  0.6  0.6 - 1.3 mg/dl       GFR est AA  >60.0          GFR est non-AA  >60           Calcium  10.5 (H)  8.5 - 10.1 mg/dl       AST (SGOT)  491 (H)  15 - 37 U/L       ALT (SGPT)  61  12 - 78 U/L       Alk. phosphatase  863 (H)  45 - 117 U/L       Bilirubin, total  1.7 (H)  0.2 - 1.0 mg/dl       Protein, total  6.7  6.4 - 8.2 gm/dl       Albumin  2.3 (L)  3.4 - 5.0 gm/dl       Anion gap  10  5 - 15 mmol/L       MAGNESIUM  Collection Time: 08/19/18  6:06 AM         Result  Value  Ref Range            Magnesium  1.5 (L)  1.6 - 2.6 mg/dl       PHOSPHORUS          Collection Time: 08/19/18  6:06 AM         Result  Value  Ref Range            Phosphorus  2.9  2.5 - 4.9 mg/dl           Radiology:      Xr Chest Sngl V      Result Date: 08/17/2018   IMPRESSION: Edema versus multifocal pneumonia or chronic changes bilaterally. Cardiomegaly. Postsurgical changes cervical spine.       Ct Head Wo Cont      Result Date: 08/17/2018   Impression: cerebral atrophy and nonspecific white matter changes not unusual for age; multiple lesions as described in brain and calvarium worrisome for metastatic disease on this noncontrast study. Contrast MRI suggested for further evaluation.       Cta Chest W Or W Wo Cont      Result Date: 08/17/2018   Impression: 1. No evidence of pulmonary embolus or aortic dissection. 2. 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 3. Indeterminate lesion right thyroid lobe which could be evaluated with ultrasound. 4. Cardiomegaly. 5. Bilateral  small pleural effusions. 6. Multiple lesions throughout lungs and bones consistent with metastatic disease possibly from breast primary with asymmetric multi centric lesions throughout the right breast and overlying skin thickening with asymmetric upper  normal size right axillary nodes. 7. Compression fractures T3 and T4 with retropulsion. 8. Heterogeneous liver. Metastatic disease not excluded.       Ct Abd Pelv W Cont      Result Date: 08/17/2018   IMPRESSION: 1. 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  Compression fractures L2 through L4. Small acute fracture suspected at superior lateral right acetabular margin. 2. Indeterminate right renal lesion likely represent cyst which  could be confirmed with ultrasound. 3. Left hip replacement. 4. Uterus atrophic or removed. 5. Atherosclerotic vascular disease. 6. Constipation.

## 2018-08-19 NOTE — Progress Notes (Signed)
PAGER ID: [phone]   MESSAGE: Mazon, [place]. Increasingly agitated and restless. Family and AVASYS at bedside and no resolution. May we have PRN Haldol order? Thanks! [place], [place]

## 2018-08-19 NOTE — Progress Notes (Signed)
Problem: Falls - Risk of  Goal: *Absence of Falls  Description  Document Kayla [name] Fall Risk and appropriate interventions in the flowsheet.  Outcome: Progressing Towards Goal  Note:   Fall Risk Interventions:  Mobility Interventions: Assess mobility with egress test, Patient to call before getting OOB, PT Consult for mobility concerns, OT consult for ADLs, Communicate number of staff needed for ambulation/transfer    Mentation Interventions: Adequate sleep, hydration, pain control, Bed/chair exit alarm, Door open when patient unattended, Evaluate medications/consider consulting pharmacy, Reorient patient, Room close to nurse's station    Medication Interventions: Assess postural VS orthostatic hypotension, Bed/chair exit alarm, Evaluate medications/consider consulting pharmacy    Elimination Interventions: Bed/chair exit alarm, Toileting schedule/hourly rounds, Patient to call for help with toileting needs, Call light in reach    History of Falls Interventions: Bed/chair exit alarm, Door open when patient unattended

## 2018-08-19 NOTE — Progress Notes (Signed)
SW received a consult to complete a [facility] for possible snf placement.  SW attempted to speak with the pt at the bed side, however pt was asleep and was not easily awaken.  SW spoke w/ pt's daughter, Kayla [name] and explained the reason for the contact.  Kayla [name] reports the pt resides with her and she takes care of the pt.  She also said that she hired a personal care partner to car for the pt while she works or go out of town.  She said the pt has [place] Medicaid and she is in the mist of converting the this services to [facility].  She wants the pt to come home with personal care through [facility].  SW explained that Medicaid most likely will only cover 5 days of week ad 5 hrs worth of care in the home.  Kayla [name] reports that she will continue to also paid their personal care giver to remain in the home alone with the Medical CP.    SW inquired if the pt will go to a snf for a short stay and then return home.  Kayla [name] said she will first discuss this with the pt and get back with this SW.  Kayla [name] wants a list of the Medicaid approved CP agencies.  SW made her aware that the list alone with the DMAS 97 will be left in pt's room on the white board.  CM made aware vis email.

## 2018-08-20 LAB — RENAL FUNCTION PANEL
Albumin: 2.4 gm/dl — ABNORMAL LOW (ref 3.4–5.0)
Albumin: 2.4 gm/dl — ABNORMAL LOW (ref 3.4–5.0)
Anion Gap: 11 mmol/L (ref 5–15)
Anion gap: 11 mmol/L (ref 5–15)
BUN: 20 mg/dl (ref 7–25)
BUN: 20 mg/dl (ref 7–25)
CO2: 25 mEq/L (ref 21–32)
CO2: 25 mEq/L (ref 21–32)
Calcium: 10.5 mg/dl — ABNORMAL HIGH (ref 8.5–10.1)
Calcium: 10.5 mg/dl — ABNORMAL HIGH (ref 8.5–10.1)
Chloride: 103 mEq/L (ref 98–107)
Chloride: 103 mEq/L (ref 98–107)
Creatinine: 0.6 mg/dl (ref 0.6–1.3)
Creatinine: 0.6 mg/dl (ref 0.6–1.3)
EGFR IF NonAfrican American: >60
GFR African American: >60
GFR est AA: >60
GFR est non-AA: >60
Glucose: 107 mg/dl — ABNORMAL HIGH (ref 74–106)
Glucose: 107 mg/dl — ABNORMAL HIGH (ref 74–106)
Phosphorus: 2.8 mg/dl (ref 2.5–4.9)
Phosphorus: 2.8 mg/dl (ref 2.5–4.9)
Potassium: 3.8 mEq/L (ref 3.5–5.1)
Potassium: 3.8 mEq/L (ref 3.5–5.1)
Sodium: 139 mEq/L (ref 136–145)
Sodium: 139 mEq/L (ref 136–145)

## 2018-08-20 LAB — MAGNESIUM
Magnesium: 1.9 mg/dl (ref 1.6–2.6)
Magnesium: 1.9 mg/dl (ref 1.6–2.6)

## 2018-08-20 MED ORDER — MORPHINE 2 MG/ML INJECTION
2 mg/mL | INTRAMUSCULAR | Status: DC | PRN
Start: 2018-08-20 — End: 2018-08-25
  Administered 2018-08-20 – 2018-08-24 (×6): via INTRAVENOUS

## 2018-08-20 MED ORDER — SODIUM CHLORIDE 0.9 % IV
Freq: Once | INTRAVENOUS | Status: AC
Start: 2018-08-20 — End: 2018-08-20
  Administered 2018-08-20: 13:00:00 via INTRAVENOUS

## 2018-08-20 MED ORDER — SODIUM CHLORIDE 0.9 % IV
INTRAVENOUS | Status: DC
Start: 2018-08-20 — End: 2018-08-24
  Administered 2018-08-20 – 2018-08-24 (×6): via INTRAVENOUS

## 2018-08-20 MED ORDER — METOPROLOL TARTRATE 5 MG/5 ML IV SOLN
5 mg/ mL | Freq: Once | INTRAVENOUS | Status: AC
Start: 2018-08-20 — End: 2018-08-20
  Administered 2018-08-20: 14:00:00 via INTRAVENOUS

## 2018-08-20 MED FILL — POTASSIUM BICARBONATE-CITRIC ACID 25 MEQ EFFERVESCENT TAB: 25 mEq | ORAL | Qty: 1

## 2018-08-20 MED FILL — MORPHINE 2 MG/ML INJECTION: 2 mg/mL | INTRAMUSCULAR | Qty: 1

## 2018-08-20 MED FILL — ALBUTEROL SULFATE 0.083 % (0.83 MG/ML) SOLN FOR INHALATION: 2.5 mg /3 mL (0.083 %) | RESPIRATORY_TRACT | Qty: 1

## 2018-08-20 MED FILL — LOVENOX 40 MG/0.4 ML SUBCUTANEOUS SYRINGE: 40 mg/0.4 mL | SUBCUTANEOUS | Qty: 0.4

## 2018-08-20 MED FILL — BD POSIFLUSH NORMAL SALINE 0.9 % INJECTION SYRINGE: INTRAMUSCULAR | Qty: 10

## 2018-08-20 MED FILL — METOPROLOL TARTRATE 5 MG/5 ML IV SOLN: 5 mg/ mL | INTRAVENOUS | Qty: 5

## 2018-08-20 MED FILL — DEXAMETHASONE SODIUM PHOSPHATE 4 MG/ML IJ SOLN: 4 mg/mL | INTRAMUSCULAR | Qty: 1

## 2018-08-20 MED FILL — SODIUM CHLORIDE 0.9 % IV: INTRAVENOUS | Qty: 1000

## 2018-08-20 MED FILL — ACETAMINOPHEN 325 MG TABLET: 325 mg | ORAL | Qty: 2

## 2018-08-20 NOTE — Progress Notes (Signed)
MD authorized for pt to go for radiation, called radiation department and informed them can come pick pt.

## 2018-08-20 NOTE — Progress Notes (Signed)
PAGER ID: [phone]   MESSAGE: [phone] Kayla [name] Pt took PO meds earlier and vomited, do you wish to order IV metoprolol? thanks [place] [phone]

## 2018-08-20 NOTE — Progress Notes (Signed)
Problem: Falls - Risk of  Goal: *Absence of Falls  Description  Document Kayla [name] Fall Risk and appropriate interventions in the flowsheet.  Outcome: Progressing Towards Goal  Note:   Fall Risk Interventions:  Mobility Interventions: Assess mobility with egress test, Bed/chair exit alarm    Mentation Interventions: Adequate sleep, hydration, pain control, Bed/chair exit alarm    Medication Interventions: Bed/chair exit alarm    Elimination Interventions: Bed/chair exit alarm, Call light in reach    History of Falls Interventions: Bed/chair exit alarm, Door open when patient unattended

## 2018-08-20 NOTE — Progress Notes (Signed)
EKG done, MD MacGregor informed, orders received - bolus Normal Saline 1043mls, PO Metoprolol 5mg  stat.

## 2018-08-20 NOTE — Progress Notes (Signed)
Medicine Progress Note    Patient: Kayla [name]   Age:  77 y.o.  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  Monitor off antibiotics  -Decadron IV for brain mass with vasogenic cerebral edema  -Radiation oncology consult appreciated, 2 of 10 fractions of whole brain radiation plan for today, may attempt to simulate spine if patient can tolerate today  -Oncology Dr. Rexford Maus appreciated  -Discussed with daughter [name] on phone at [phone], discussed mother's advanced cancer and poor prognosis on 08/18/2018.  Discussed again today patient's mother advanced metastatic breast cancer and poor overall prognosis, family wishes to continue palliative treatments though I am not sure they grasp a full extent of patient's illness, would benefit from palliative care just not sure they are ready for this at this point.   -Discussed DNR/DNI, at this point they wish all aggressive measures, full care.     Subjective:   Patient seen and examined.  Patient heart rate at this morning with episodes of shortness of breath, given some metoprolol and IV fluids when seen patient heart rate had improved down to the low 100s and breathing normal no complaints, No N/V, F/C, CP or SOB      Objective:     Visit Vitals  BP 133/85 (BP 1 Location: Left arm, BP Patient Position: Supine)   Pulse (!) 115   Temp 98.3 ??F (36.8 ??C)   Resp 18   Ht 5\' 6"  (1.676 m)   Wt 57.5 kg (126 lb 12.2 oz)   SpO2 100%   BMI 20.46 kg/m??       Physical Exam:  General appearance: alert, cooperative, thin and frail appearing  Head: Normocephalic, bitemporal wasting  Neck: supple, trachea midline   Lungs: clear to auscultation bilaterally  Heart: regular rate and rhythm, S1, S2 normal, no murmur, click, rub or gallop  Abdomen: soft, non-tender. Bowel sounds normal. No masses,  no organomegaly  Extremities: extremities normal, atraumatic, no cyanosis or edema  Skin: Skin color, texture, turgor normal. No rashes or lesions in exposed areas       Medications Reviewed:  Current Facility-Administered Medications   Medication Dose Route Frequency   ??? 0.9% sodium chloride infusion  100 mL/hr IntraVENous CONTINUOUS   ??? sodium chloride (NS) flush 5-10 mL  5-10 mL IntraVENous Q8H   ??? sodium chloride (NS) flush 5-10 mL  5-10 mL IntraVENous PRN   ??? naloxone (NARCAN) injection 0.1 mg  0.1 mg IntraVENous PRN   ??? acetaminophen (TYLENOL) tablet 650 mg  650 mg Oral Q6H PRN   ??? albuterol (PROVENTIL VENTOLIN) nebulizer solution 2.5 mg  2.5 mg Nebulization Q6H PRN   ??? enoxaparin (LOVENOX) injection 40 mg  40 mg SubCUTAneous Q24H   ??? influenza vaccine 2019-20 (4 yrs+)(PF) (FLUCELVAX QUAD) injection 0.5 mL  0.5 mL IntraMUSCular PRIOR TO DISCHARGE   ??? dexamethasone (DECADRON) 4 mg/mL injection 4 mg  4 mg IntraVENous Q6H   ??? potassium bicarbonate (KLYTE) tablet 25 mEq  25 mEq Oral BID         Intake and Output:  Current Shift:  09/26 0701 -  09/26 1900  In: 120 [P.O.:120]  Out: -   Last three shifts:  09/24 1901 - 09/26 0700  In: 2251.7 [I.V.:2251.7]  Out: 1300 [Urine:1300]    Lab/Data Reviewed:  All lab and imaging  results for the last 24 hours reviewed.    Stephanie Coup, MD  P# [phone]  August 20, 2018    [facility] medical dictation software was used for portions of this report.  Unintended voice transcription errors may have occurred.

## 2018-08-20 NOTE — Progress Notes (Signed)
[place]  Oncology Progress Note  NAME:  Kayla [name], Kayla [name]  SEX:   F  DOB:   07-Jun-1941  DATE: 08/20/2018  REFERRING PHYSICIAN:    MR#    [id number]  LOCATION: RADIATION ONCOLOGY  BILLING#  [id number]    cc:     The patient today underwent treatment #2 of 10 planned whole brain treatments for metastatic breast cancer.  Treatment to the brain was done without incident.  She declined remapping for spine treatment.  She was brought back to her room in very good condition.      ___________________  Oliver Barre MD  Dictated By: .   MLT  D:08/20/2018 15:01:15  T: 08/20/2018  [id number]

## 2018-08-20 NOTE — Progress Notes (Signed)
Met with dtr Juliann Pulse she said mom does need rehab however she also needs radiation for total of 10 treatments 1st today. Explained to her that this may decrease her SNF acceptances. Some will accept but usually not that many. Also family will be responsible to get her to and from radiation apt. She Juliann Pulse will be out of town till Monday but her sister Becky Sax will be here till Tuesday  [phone] is phone number.  Loaded PT to edc and informed SNFs of radiaition apt. dtr aware we will need auth for SNF>

## 2018-08-20 NOTE — Other (Signed)
Bedside shift change report given to Marfa (oncoming nurse) by Durenda Guthrie RN (offgoing nurse). Report included the following information SBAR and Kardex.

## 2018-08-20 NOTE — Progress Notes (Signed)
PAGER ID: [phone]   MESSAGE: [phone] Kayla [name] Pt overly agitated and restless, refusing anything PO, do you wanna put her of IV analgesia and/or something to calm her down? Olegario Shearer RN [phone]

## 2018-08-20 NOTE — Progress Notes (Signed)
Radiation department calls to pick pt for radiation, informed to hold for the MD to consent the transfer as the pt is tarchycardic.

## 2018-08-20 NOTE — Progress Notes (Signed)
PAGER ID: [phone]   MESSAGE: 5101 [facility] pt tachycardic to 130s, manual heart rate [phone]

## 2018-08-20 NOTE — Progress Notes (Signed)
PAGER ID: [phone]   MESSAGE: 5101 [facility] pt tachycardic to 130s, manual heart rate [phone]

## 2018-08-20 NOTE — Progress Notes (Signed)
PAGER ID: [phone]   MESSAGE: 5101 Kayla [name] Pt took PO meds earlier and vomited, do you wish to order IV metoprolol? thanks [employer] RN [phone]

## 2018-08-20 NOTE — Progress Notes (Signed)
PAGER ID: [phone]   MESSAGE: 5101 Kayla [name] Pt overly agitated and restless, refusing anything PO, do you wanna put her of IV analgesia and/or something to calm her down? Rubin Payor RN [phone]

## 2018-08-20 NOTE — Progress Notes (Signed)
Medicine Progress Note    Patient: Kayla [name]   Age:  77 y.o.  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  Monitor off antibiotics  -Decadron IV for brain mass with vasogenic cerebral edema  -Radiation oncology consult appreciated, 2 of 10 fractions of whole brain radiation plan for today, may attempt to simulate spine if patient can tolerate today  -Oncology Dr. Rexford [name] appreciated  -Discussed with daughter Kayla [name] on phone at [phone], discussed mother's advanced cancer and poor prognosis on 08/18/2018.  Discussed again today patient's mother advanced metastatic breast cancer and poor overall prognosis, family wishes to continue palliative treatments though I am not sure they grasp a full extent of patient's illness, would benefit from palliative care just not sure they are ready for this at this point.   -Discussed DNR/DNI, at this point they wish all aggressive measures, full care.     Subjective:   Patient seen and examined.  Patient heart rate at this morning with episodes of shortness of breath, given some metoprolol and IV fluids when seen patient heart rate had improved down to the low 100s and breathing normal no complaints, No N/V, F/C, CP or SOB      Objective:     Visit Vitals  BP 133/85 (BP 1 Location: Left arm, BP Patient Position: Supine)   Pulse (!) 115   Temp 98.3 ??F (36.8 ??C)   Resp 18   Ht 5\' 6"  (1.676 m)   Wt 57.5 kg (126 lb 12.2 oz)   SpO2 100%   BMI 20.46 kg/m??       Physical Exam:  General appearance: alert, cooperative, thin and frail appearing  Head: Normocephalic, bitemporal wasting  Neck: supple, trachea midline  Lungs: clear  to auscultation bilaterally  Heart: regular rate and rhythm, S1, S2 normal, no murmur, click, rub or gallop  Abdomen: soft, non-tender. Bowel sounds normal. No masses,  no organomegaly  Extremities: extremities normal, atraumatic, no cyanosis or edema  Skin: Skin color, texture, turgor normal. No rashes or lesions in exposed areas       Medications Reviewed:  Current Facility-Administered Medications   Medication Dose Route Frequency   ??? 0.9% sodium chloride infusion  100 mL/hr IntraVENous CONTINUOUS   ??? sodium chloride (NS) flush 5-10 mL  5-10 mL IntraVENous Q8H   ??? sodium chloride (NS) flush 5-10 mL  5-10 mL IntraVENous PRN   ??? naloxone (NARCAN) injection 0.1 mg  0.1 mg IntraVENous PRN   ??? acetaminophen (TYLENOL) tablet 650 mg  650 mg Oral Q6H PRN   ??? albuterol (PROVENTIL VENTOLIN) nebulizer solution 2.5 mg  2.5 mg Nebulization Q6H PRN   ??? enoxaparin (LOVENOX) injection 40 mg  40 mg SubCUTAneous Q24H   ??? influenza vaccine 2019-20 (4 yrs+)(PF) (FLUCELVAX QUAD) injection 0.5 mL  0.5 mL IntraMUSCular PRIOR TO DISCHARGE   ??? dexamethasone (DECADRON) 4 mg/mL injection 4 mg  4 mg IntraVENous Q6H   ??? potassium bicarbonate (KLYTE) tablet 25 mEq  25 mEq Oral BID         Intake and Output:  Current Shift:  09/26 0701 -  09/26 1900  In: 120 [P.O.:120]  Out: -   Last three shifts:  09/24 1901 - 09/26 0700  In: 2251.7 [I.V.:2251.7]  Out: 1300 [Urine:1300]    Lab/Data Reviewed:  All lab and imaging  results for the last 24 hours reviewed.    Kayla Coup, MD  P# [phone]  August 20, 2018    [facility] medical dictation software was used for portions of this report.  Unintended voice transcription errors may have occurred.

## 2018-08-20 NOTE — Progress Notes (Signed)
[place]  Oncology Progress Note  NAME:  Kayla [name], Syndi  SEX:   F  DOB:   29-Nov-1940  DATE: 08/20/2018  REFERRING PHYSICIAN:    MR#    [id number]  LOCATION: RADIATION ONCOLOGY  BILLING#  [id number]    cc:    The patient today underwent treatment #2 of 10 planned whole brain treatments for metastatic breast cancer.  Treatment to the brain was done without incident.  She declined remapping for spine treatment.  She was brought back to her room in very good condition.      ___________________  Oliver Barre MD  Dictated By: .   MLT  D:08/20/2018 15:01:15  T: 08/20/2018  [id number]

## 2018-08-20 NOTE — Progress Notes (Signed)
EKG done, MD MacGregor informed, orders received - bolus Normal Saline 1081mls, PO Metoprolol 5mg  stat.

## 2018-08-20 NOTE — Progress Notes (Signed)
Met with dtr Olegario Messier she said mom does need rehab however she also needs radiation for total of 10 treatments 1st today. Explained to her that this may decrease her SNF acceptances. Some will accept but usually not that many. Also family will be responsible to get her to and from radiation apt. She Olegario Messier will be out of town till Monday but her sister Celine Mans will be here till Tuesday  [phone] is phone number.  Loaded PT to edc and informed SNFs of radiaition apt. dtr aware we will need auth for SNF>

## 2018-08-21 LAB — EKG, 12 LEAD, INITIAL
Atrial Rate: 120 {beats}/min
Calculated P Axis: 73 degrees
Calculated R Axis: 0 degrees
Calculated T Axis: -97 degrees
P-R Interval: 150 ms
Q-T Interval: 384 ms
QRS Duration: 84 ms
QTC Calculation (Bezet): 542 ms
Ventricular Rate: 120 {beats}/min

## 2018-08-21 LAB — RENAL FUNCTION PANEL
Albumin: 2.7 gm/dl — ABNORMAL LOW (ref 3.4–5.0)
Albumin: 2.7 gm/dl — ABNORMAL LOW (ref 3.4–5.0)
Anion Gap: 10 mmol/L (ref 5–15)
Anion gap: 10 mmol/L (ref 5–15)
BUN: 25 mg/dl (ref 7–25)
BUN: 25 mg/dl (ref 7–25)
CO2: 23 mEq/L (ref 21–32)
CO2: 23 mEq/L (ref 21–32)
Calcium: 9.7 mg/dl (ref 8.5–10.1)
Calcium: 9.7 mg/dl (ref 8.5–10.1)
Chloride: 107 mEq/L (ref 98–107)
Chloride: 107 mEq/L (ref 98–107)
Creatinine: 0.7 mg/dl (ref 0.6–1.3)
Creatinine: 0.7 mg/dl (ref 0.6–1.3)
EGFR IF NonAfrican American: >60
GFR African American: >60
GFR est AA: >60
GFR est non-AA: >60
Glucose: 123 mg/dl — ABNORMAL HIGH (ref 74–106)
Glucose: 123 mg/dl — ABNORMAL HIGH (ref 74–106)
Phosphorus: 2.7 mg/dl (ref 2.5–4.9)
Phosphorus: 2.7 mg/dl (ref 2.5–4.9)
Potassium: 4.2 mEq/L (ref 3.5–5.1)
Potassium: 4.2 mEq/L (ref 3.5–5.1)
Sodium: 140 mEq/L (ref 136–145)
Sodium: 140 mEq/L (ref 136–145)

## 2018-08-21 LAB — CALCIUM, IONIZED
CALCIUM,IONIZED: 4.9 mg/dl (ref 4.4–5.4)
CALCIUM,IONIZED: 4.9 mg/dl (ref 4.4–5.4)

## 2018-08-21 LAB — EKG 12-LEAD
Atrial Rate: 120 {beats}/min
P Axis: 73 degrees
P-R Interval: 150 ms
Q-T Interval: 384 ms
QRS Duration: 84 ms
QTc Calculation (Bazett): 542 ms
R Axis: 0 degrees
T Axis: -97 degrees
Ventricular Rate: 120 {beats}/min

## 2018-08-21 MED ORDER — FULVESTRANT 250 MG/5 ML IM SYRINGE
250 mg/5 mL | INTRAMUSCULAR | Status: AC
Start: 2018-08-21 — End: 2018-08-21
  Administered 2018-08-21: 14:00:00 via INTRAMUSCULAR

## 2018-08-21 MED ORDER — FUROSEMIDE 10 MG/ML IJ SOLN
10 mg/mL | Freq: Once | INTRAMUSCULAR | Status: AC
Start: 2018-08-21 — End: 2018-08-21
  Administered 2018-08-21: 12:00:00 via INTRAVENOUS

## 2018-08-21 MED ORDER — FULVESTRANT 250 MG/5 ML IM SYRINGE
250 mg/5 mL | INTRAMUSCULAR | Status: DC
Start: 2018-08-21 — End: 2018-08-21

## 2018-08-21 MED FILL — DEXAMETHASONE SODIUM PHOSPHATE 4 MG/ML IJ SOLN: 4 mg/mL | INTRAMUSCULAR | Qty: 1

## 2018-08-21 MED FILL — FUROSEMIDE 10 MG/ML IJ SOLN: 10 mg/mL | INTRAMUSCULAR | Qty: 2

## 2018-08-21 MED FILL — LOVENOX 40 MG/0.4 ML SUBCUTANEOUS SYRINGE: 40 mg/0.4 mL | SUBCUTANEOUS | Qty: 0.4

## 2018-08-21 MED FILL — POTASSIUM BICARBONATE-CITRIC ACID 25 MEQ EFFERVESCENT TAB: 25 mEq | ORAL | Qty: 1

## 2018-08-21 MED FILL — BD POSIFLUSH NORMAL SALINE 0.9 % INJECTION SYRINGE: INTRAMUSCULAR | Qty: 10

## 2018-08-21 MED FILL — MORPHINE 2 MG/ML INJECTION: 2 mg/mL | INTRAMUSCULAR | Qty: 1

## 2018-08-21 MED FILL — FASLODEX 250 MG/5 ML INTRAMUSCULAR SYRINGE: 250 mg/5 mL | INTRAMUSCULAR | Qty: 10

## 2018-08-21 MED FILL — ALBUTEROL SULFATE 0.083 % (0.83 MG/ML) SOLN FOR INHALATION: 2.5 mg /3 mL (0.083 %) | RESPIRATORY_TRACT | Qty: 1

## 2018-08-21 NOTE — Progress Notes (Signed)
Late note for 08/20/2018:  DMAS 97 signed by pt's daughter and DMAS 74 signed by pt's daughter.  [name] wants a snf level of care for pt upon dc.  UAI completed and submitted to [place] for review.

## 2018-08-21 NOTE — Other (Signed)
Bedside shift change report given to Edith RN (oncoming nurse) by Sheila RN (offgoing nurse). Report included the following information SBAR and Kardex.

## 2018-08-21 NOTE — Other (Signed)
PAGER ID: [phone]   MESSAGE: [place] 71 pt of dr Nolen Mu admitted with ams. hx of breast ca with mets. HR is 120s, she was started with saline at 100cc during day shift. breath sounds is STARTING wet. O2 sat is 98%. CAN WE STOP iv? Freda Munro [phone]  [phone] character message out of a maximum of 240)       Close [X]     Send Another Page

## 2018-08-21 NOTE — Progress Notes (Signed)
The daughter is very concerned about the mother receiving chemotherapy in addition to radiation treatment she is receiving, wants to know how often her mother will receive chemo injections, explained to her I already gave her first dose today in the morning,  I also told her about asking DR. Sabra Heck who referred me to DR.Naga, who finally said it will be every 2 weeks.

## 2018-08-21 NOTE — Progress Notes (Signed)
[name] in the room, appers very  upset after talking to the palliative nurse, CP goes in to change the diaper which is wet, she is angry about the mother being in a wet diaper, also states that the lunch offered is too hard for her mother, and she should have been fed,     i explained that she needs to be in a diaper since she is incontinent of urine and stool and not able to ask for help at her current condition, however we are ensuring she is dry at all times, and changing it as needed. She is also explained that due to her mother changing condition today to being much more drowsy and barely arousable, she is not able to feed despite repeatedly trying to feed her. The dietary team is also called and the daughter orders what she thinks her mother may eat, however, she is not able to eat anything in the second tray too. The daughter verbalizes being upset about  change in condition today and is teearful.

## 2018-08-21 NOTE — Progress Notes (Signed)
Able to reach patients daughter, Juliann Pulse who is out of town in [place].  Updated her on the changes noted in her moms condition today.  She stated her sister, Becky Sax is on her way to the hospital and asked me to meet with her once she arrived.  Notified bedside RN to please call PC once the daughter arrives.

## 2018-08-21 NOTE — Progress Notes (Signed)
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  Brain metastases  Hypercalcemia sec to bone mets  Elevated LFT sec liver mets  Anemia  Hypomagnesemia  Poor PS and nutritional status, failure to thrive  Plan:   ??  1. Continue present care.  2. Decadron and XRT, reviewed Dr Callie Fielding notes.  3. s/p bisphosphonate. Will monitor labs.  Recently saw Dr Brent General systemic hormonal therapy with faslodex was planned in clinic, will give 1 dose today, overall poor prognosis. Will follow.      Hosie Spangle MD  [facility] [phone]      Subjective:     Patient has no new complaints, had physical therapy, tired sleepy.     Objective:     Visit Vitals  BP (!) 156/94 (BP 1 Location: Left arm, BP Patient Position: Supine)   Pulse (!) 118   Temp 97.4 ??F (36.3 ??C)   Resp 22   Ht 5\' 6"  (1.676 m)   Wt 57.5 kg (126 lb 12.2 oz)   SpO2 99%   BMI 20.46 kg/m??             Temp (24hrs), Avg:97.9 ??F (36.6 ??C), Min:97.4 ??F (36.3 ??C), Max:98.7 ??F (37.1 ??C)        Intake/Output Summary (Last 24 hours) at 08/21/2018 0836  Last data filed at 08/21/2018 0102  Gross per 24 hour   Intake 2198.34 ml   Output 200 ml   Net 1998.34 ml       Current Facility-Administered Medications   Medication Dose Route Frequency   ??? 0.9% sodium chloride infusion  100 mL/hr IntraVENous CONTINUOUS   ??? morphine injection 1-2 mg  1-2 mg IntraVENous Q3H PRN   ??? sodium chloride (NS) flush 5-10 mL  5-10 mL IntraVENous Q8H   ??? sodium chloride (NS) flush 5-10 mL  5-10 mL IntraVENous PRN   ??? naloxone (NARCAN) injection 0.1 mg  0.1 mg IntraVENous PRN   ??? acetaminophen (TYLENOL) tablet 650 mg  650 mg Oral Q6H PRN    ??? albuterol (PROVENTIL VENTOLIN) nebulizer solution 2.5 mg  2.5 mg Nebulization Q6H PRN   ??? enoxaparin (LOVENOX) injection 40 mg  40 mg SubCUTAneous Q24H   ??? influenza vaccine 2019-20 (4 yrs+)(PF) (FLUCELVAX QUAD) injection 0.5 mL  0.5 mL IntraMUSCular PRIOR TO DISCHARGE   ??? dexamethasone (DECADRON) 4 mg/mL injection 4 mg  4 mg IntraVENous Q6H   ??? potassium bicarbonate (KLYTE) tablet 25 mEq  25 mEq Oral BID       Review of Systems  Constitutional:   Fever: Negative, Chills: Negative Malaise/Fatigue: Negative   Diaphoresis: Negative  Skin:   Rash: Negative,  Itching: Negative  HENT:   Headache:Negative Nosebleeds: Negative, Stridor: Negative, Sore Throat: Negative  Eyes: Jaudice: negative Blurred Vision: Negative, Double Vision: Negative Eye Redness: Negative  Cardiovascular:    Chest Pain: Negative, Palpitations: Negative, Orthopnea: Negative    Leg Swelling: Negative   Respiratory:   Cough: Negative, Hemoptysis: Negative   Shortness of Breath: Negative, Wheezing: Negative  Gastrointestinal:  Nausea: Negative, Vomiting: Negative   Abdominal Pain: Negative, Diarrhea: Negative, Constipation: Negative   Blood in Stool: Negative  Genitourinary:    Dysuria: Negative, Urgency: Negative, Frequency: Negative   Hematuria: Negative, Flank Pain: Negative  Musculoskeletal:    Myalgias: Negative, Neck Pain: Negative, Back Pain: Negative   Joint Pain: Negative, Falls: Negative  Neurological:    Dizziness: Negative, Tingling: Negative.  Speech Change: Negative, Focal Weakness: Negative Seizures: Negative, LOC: Negative    Physical Exam:    Constitutional: Well nourished, no acute distress and alert and oriented x 3   HENT: Oral mucosa  moist, no icterus observed.   Eyes: conjunctiva normal, PERLA.   Neck: No LAD or jugular venous distension.   Cardiovascular: heart sounds normal, normal rate and regular rhythm.     Pulmonary/Chest Wall: breath sounds are clear bilaterally.    Abdominal: Non tender, no palpable masses, no hepatosplenomegaly    Musculoskeletal: Normal muscle strength and tone.   Skin: Normal and no rashes or lesions   CNS No focal deficits.   Peripheral Vascular: No edema present in extremities.         Labs:  No results found for this or any previous visit (from the past 24 hour(s)).    Radiology:    Xr Chest Sngl V    Result Date: 08/17/2018  IMPRESSION: Edema versus multifocal pneumonia or chronic changes bilaterally. Cardiomegaly. Postsurgical changes cervical spine.     Ct Head Wo Cont    Result Date: 08/17/2018  Impression: cerebral atrophy and nonspecific white matter changes not unusual for age; multiple lesions as described in brain and calvarium worrisome for metastatic disease on this noncontrast study. Contrast MRI suggested for further evaluation.     Cta Chest W Or W Wo Cont    Result Date: 08/17/2018  Impression: 1. No evidence of pulmonary embolus or aortic dissection. 2. 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 3. Indeterminate lesion right thyroid lobe which could be evaluated with ultrasound. 4. Cardiomegaly. 5. Bilateral small pleural effusions. 6. Multiple lesions throughout lungs and bones consistent with metastatic disease possibly from breast primary with asymmetric multi centric lesions throughout the right breast and overlying skin thickening with asymmetric upper normal size right axillary nodes. 7. Compression fractures T3 and T4 with retropulsion. 8. Heterogeneous liver. Metastatic disease not excluded.     Ct Abd Pelv W Cont    Result Date: 08/17/2018  IMPRESSION: 1. 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 Compression fractures L2 through L4. Small acute fracture suspected at superior lateral right acetabular margin. 2. Indeterminate right renal lesion likely represent cyst which could be confirmed with ultrasound. 3. Left hip replacement. 4. Uterus atrophic or removed. 5. Atherosclerotic  vascular disease. 6. Constipation.

## 2018-08-21 NOTE — Progress Notes (Signed)
Palliative Care RN Initial Note  Palliative Care Team is available Monday-Friday 8 AM-4:30 PM       NAME:  Kayla [name]   DOB:   [birth year]   MRN:   [id number]     Date/Time Patient Seen:  08/21/2018 10:14 AM    Attending Physician: Peter Minium, MD  Primary Care Physician: Henrene Pastor, MD        Palliative Assessment/Plan:     Code Status:  Full Code  Goals of Care: Phone calls placed to both daughters and VM's left.  Patient having labored breathing, restless picking at sheets, not able to make eye contact, concerned she is transitioning.  Discussed with Dr. Peter Minium and previous discussions with Dr. Nolen Mu family wanting to continue with all aggressive measures including FULL CODE.  PC will continue to try and reach the daughters throughout the day.  RN also aware and if family arrives please contact PC.   Disposition: TBD    Current Capacity for Decision Making:  Unresponsive, not decisional      Advance Care Planning:      Advance Directives:          Medical Power of Attorney: none          Living Will: none          POLST forms:  There is no POLST (Physician orders for life-sustaining treatment) form on file for this patient     No Advance Directive: Surrogate Decision Maker: [name] (daughter) [phone]; [name] (daughter) [phone]          The following were updated:  Discussed with Attending Physician/Provider: Peter Minium, MD   Other Physician (consultant): reviewed chart notes   Nursing: RN  Care Manager/Discharge Planner: n/a today      Aline August RN, BSN, [facility]  Palliative Medicine  [phone] office      The Palliative Medicine team is available Monday to Friday from 8am to 4:30pm at [phone]    Interval History:       HPI:      HPI: Pt is 77 year old female with breast CA followed Dr. Brent General in [place] undergoing systemic hormal therapy with faslodex. Admitted on 08/17/18 for AMS; head/abd/pelv/chest CT in ED worrisome for brain with vasogenic  edemia, lung, liver mets; pt undergoing palliative WBR but unable to tolerate lying flat and then later declined to complete simulation mapping to lumbar spine fields per Dr. Callie Fielding; planning for x1 dose of faslodex but overall poor prognosis per [facility] Dr. Rexford Maus. Pertinent comorbidities include HTN.  PTA medications per EMR: Lisinopril, fentanyl TD 53mcg/hr, oxycodone IR 10mg  PO Q4hr PRN.  Social History:   Religious/Cultural/Spiritual: CHRISTIAN  Insurance:  Payor: Psychologist, prison and probation services / Plan: [place] / Product Type: Managed Care Medicare /    ECOG Performance Status (current): 4  Prior ECOG: 4  Palliative Performance Scale (PPS): 10-20%  Prior PPS: 20-30%  FAST Scale in Dementia: n/a  PC Consulted ER:[id number] by Dr. Nechama Guard  on 9/27  for goals of care, metastatic breast cancer     Review of Systems:   Unable to obtain due to clinical circumstance      Physical Exam:   Blood pressure (!) 156/94, pulse (!) 118, temperature 97.4 ??F (36.3 ??C), resp. rate 22, height 5\' 6"  (1.676 m), weight 57.5 kg (126 lb 12.2 oz), SpO2 99 %.  Body mass index is 20.46 kg/m??.  Wt Readings from Last 3 Encounters:   08/18/18 57.5 kg (126  lb 12.2 oz)     Last Bowel Movement: Last Bowel Movement Date: 08/20/18

## 2018-08-21 NOTE — Progress Notes (Addendum)
Met with the patients daughter, Becky Sax at the bedside and introduced myself and role w/ the [facility] team.  Updated her on the changes in the patients mental status and breathing today and we engaged in a code status discussion.  Becky Sax was very receptive to the discussion and we reviewed risks involved with CPR.  Education provided on palliative radiation vs curative radiation, ventilators, and provided anticipatory guidance on medical decisions the family should begin to prepare for.  Sonja plans to discuss with her siblings and understands the code status decision to be the priority at this time.  She expressed once she spoke with them she would either contact PC back or inform the bedside nurse with their decision.     Update:  Spoke with Dr. Peter Minium and Dr. Rexford Maus and they both plan to call the daughter Becky Sax to provide an update.

## 2018-08-21 NOTE — Progress Notes (Addendum)
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  I think she kind of understands code status   Appreciate palliative cares help       Subjective:   Sob and hurt    ROS:   Review of Systems   Unable to perform ROS: Acuity of condition         Objective:   Physical Exam:     Visit Vitals  BP (!) 156/94 (BP 1 Location: Left arm, BP Patient Position: Supine)   Pulse (!) 118   Temp 97.4 ??F (36.3 ??C)   Resp 22   Ht 5\' 6"  (1.676 m)   Wt 57.5 kg (126 lb 12.2 oz)   SpO2 99%   BMI 20.46 kg/m??      O2 Device: Room air    Temp (24hrs), Avg:97.9 ??F (36.6 ??C), Min:97.4 ??F (36.3 ??C), Max:98.7 ??F (37.1 ??C)    No intake/output data recorded.   09/25 1901 - 09/27 0700  In: 2198.3 [P.O.:320; I.V.:1878.3]  Out: 350 [Urine:350]    Physical Exam   Constitutional: She appears well-developed and well-nourished. She is cooperative.  Non-toxic appearance. She has a sickly appearance. She appears ill.   HENT:   Head: Normocephalic and atraumatic.   Neck: Neck supple.   Cardiovascular: Normal rate and regular rhythm.   Pulmonary/Chest: Breath sounds normal. Stridor present. She is in respiratory distress.   Abdominal: Soft.   Musculoskeletal: She exhibits no tenderness.    Neurological: She is alert.   Skin: No rash noted. No erythema.     Data Review:       24 Hour Results:  No results found for this or any previous visit (from the past 24 hour(s)).    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 0.9% sodium chloride infusion  100 mL/hr IntraVENous CONTINUOUS   ??? morphine injection 1-2 mg  1-2 mg IntraVENous Q3H PRN   ??? sodium chloride (NS) flush  5-10 mL  5-10 mL IntraVENous Q8H   ??? sodium chloride (NS) flush 5-10 mL  5-10 mL IntraVENous PRN   ??? naloxone (NARCAN) injection 0.1 mg  0.1 mg IntraVENous PRN   ??? acetaminophen (TYLENOL) tablet 650 mg  650 mg Oral Q6H PRN   ??? albuterol (PROVENTIL VENTOLIN) nebulizer solution 2.5 mg  2.5 mg Nebulization Q6H PRN   ??? enoxaparin (LOVENOX) injection 40 mg  40 mg SubCUTAneous Q24H   ??? influenza vaccine 2019-20 (4 yrs+)(PF) (FLUCELVAX QUAD) injection 0.5 mL  0.5 mL IntraMUSCular PRIOR TO DISCHARGE   ??? dexamethasone (DECADRON) 4 mg/mL injection 4 mg  4 mg IntraVENous Q6H   ??? potassium bicarbonate (KLYTE) tablet 25 mEq  25 mEq Oral BID        No Known Allergies    Care Plan discussed with: Patient/Family and Nurse    Total time cooedinating care was 45 minutes    Peter Minium, MD  August 21, 2018  8:42 AM

## 2018-08-21 NOTE — Progress Notes (Signed)
[place]  Oncology Progress Note  NAME:  [name], Kayla  SEX:   F  DOB:   12/02/1940  DATE: 08/21/2018  REFERRING PHYSICIAN:    MR#    [id number]  LOCATION: RADIATION ONCOLOGY  BILLING#  [id number]    cc:     The patient today underwent treatment #3 of 10 planned treatments to the whole brain for metastatic breast cancer.  She is much more drowsy today, barely arousable.  She does open eyes when transferring from bed to a stretcher.  Third radiation treatment was done without incident.  This totals 900 cGy.  A call was made to patient's daughters, but they have not responded yet.  I will watch her over the weekend and continue treatment on Monday if appropriate.      ___________________  Oliver Barre MD  Dictated By: .   [place]  D:08/21/2018 11:36:00  T: 08/21/2018  [id number]

## 2018-08-21 NOTE — Progress Notes (Signed)
physical Therapy treatment    Patient: Kayla [name] (77 y.o. 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  Weight bearing precautions: None      ASSESSMENT :  Based on the objective data described below, the patient presents with  Able to participate with LE exercises and sit EOB, limited by confusion today  Generalized muscle weakness affecting function   Decreased Strength  Decreased ADL/Functional Activities  Decreased Transfer Abilities  Decreased Ambulation Ability/Technique  Decreased Balance  Increased Pain  Decreased Activity Tolerance.      Patient???s rehabilitation potential is considered to be Good     Recommendations:  Physical Therapy  Discharge Recommendations: SNF, 24 hour supervision for safety, Home with home health PT and TBD, pending progress  Further Equipment Recommendations for Discharge: None        PLAN :  Planned Interventions:  Functional mobility training Gait Training Stair training Balance Training Therapeutic exercises Therapeutic activities Neuro muscular re-education AD training Patient/caregiver education      Frequency/Duration: Patient will be followed by physical therapy 3x / Week x4weeks to address goals.                 Orders reviewed, chart reviewed on [facility].    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 Back pain     ??? Breast cancer ([place])          Patient found: Bed, IV and  bed alarm.    Pain Assessment before PT session: Not rated  Pain Location: all over per pt  Pain Assessment after PT session: Not rated  Pain Location:    []           Yes, patient had pain medications  []           No, Patient has not had pain medications  []           Nurse notified    COGNITIVE STATUS:     Mental Status: Oriented to, person, place and confused.  Communication: normal.  Follows commands: intact.  General Cognition: impaired safety and impulsivity.  Hearing: grossly intact.  Vision:  grossly intact.            Functional mobility and balance status:     Supine to sit -  min. assist, mod. assist, verbal cues, set-up, safety concerns, increased time and 1 person  Sit to Supine -  min. assist, mod. assist, verbal cues, manual cues, visual cues, safety concerns, increased time and 1 person   Unable to ambulate or stand  today, pt limited by cognition and lethargy  Ambulation/Gait Training:  unable        Lower Extremities:  Seated, Glut Set, Hamstring Set, Heel Slide, Long arc quad, Straight leg raise, Hip ab/duction, Ankle pumps, BLE, Assisted, 10 reps and Cueing        Activity Tolerance:   fair    Final Location:   bed, bed alarm, all needs close, agrees to call for assistance and nurse notified     COMMUNICATION/EDUCATION:   Education: Patient, Benefit of activity while hospitalized, Call for assistance, Out of bed 2-3 times/day, Staff assistance with mobility, Changes positions frequently, Sit out of bed for 45-60 minutes or as tolerated, Safety, Functional mobility, Demonstrates adequately, Verbalized understanding, Teaching method, Verbal and Role of PT  Barriers to Learning/Limitations: None    Please refer to care plan and patient education section for further details.    Thank you for this referral.  Luan Pulling, PT, DPT  Pager [phone]

## 2018-08-21 NOTE — Progress Notes (Signed)
Adult Malnutrition Guidelines:  SEVERE PROTEIN CALORIE MALNUTRITION IN THE CONTEXT OF ACUTE INJURY/ILLNESS  Weight loss: 14% x 2 months  Energy intake: <50% energy intake compared to estimated energy needs >1 month  Body fat: severe depletion  Muscle mass: severe depletion   Other considerations: breast CA stage 4, brain mass, mets to liver  ??  Please document Severe Protein Calorie Malnutrition in Problem List if in agreement    NUTRITION FOLLOW UP    Current diet order: DIET REGULAR  DIET NUTRITIONAL SUPPLEMENTS Lunch, Dinner; Ensure [employer]  DIET NUTRITIONAL SUPPLEMENTS Dinner; Optician, dispensing    Vitamin/mineral supplementation: n/a     Other pertinent meds: decadron, lasix, klyte, NS @ 100 ml/hr      Current intake: '[]'$  N/A- NPO    '[]'$  very poor     '[]'$  poor      '[x]'$  fair  (avg)    '[]'$  good  % Diet Eaten  Avg: 60 %  Min: 20 %  Max: 100 %    Weight:   Last 3 Recorded Weights in this Encounter    08/17/18 1829 08/18/18 0520   Weight: 55.3 kg (122 lb) 57.5 kg (126 lb 12.2 oz)       BMI: Body mass index is 20.46 kg/m??.    Physical assessment:  ?? Chewing/swallowing issues:   +dentures  ?? GI symptoms:    Last Bowel Movement Date: (unknown )   Stool Appearance: Soft   Abdominal Assessment: Soft   Bowel Sounds: Active   ?? Fluid retention:   None      ?? Skin integrity:   Intact        Biochemical data: 9/26 - pert labs reviewed     Assessment of current MNT: Diet appropriate, adequate to meet nutrition needs if po >75% with meals. Supplements to increase calorie/pro intake opportunity     Nutrition diagnosis:   1. Severe acute protein calorie malnutrition related to breast CA stage IV, brain mass, mets to liver noted as evidenced by po <50% x > 1 month, wt loss of 14% x 2 months, mod/severe physical wasting (continues)     Nutrition recommendation: Continue current MNT     Nutrition monitoring: PO intake, diet tolerance and compliance, wt, BS, CMP, hydration, medical changes  ??   Nutrition goals: PO >75% with meals/supplements, wt stable through LOS, nutrition related lab values WNL     Progress towards nutrition goals: '[]'$   Met/Ongoing     '[x]'$   Progressing appropriately       '[]'$   Progressing slowly      '[]'$   Not Progressing    Nutrition level of care:  '[]'$  low     '[]'$  moderate     '[x]'$  high    Code status: Full Code    Discharge recommendations: continue current MNT     Discharge Placement: Skilled nursing facility    Murlean Hark, [place]  08/21/18

## 2018-08-21 NOTE — Progress Notes (Signed)
PAGER ID: [phone]   MESSAGE: [place] pt has a wet chest, wheezing, SOB saturations normal, has PRN nebs, called respiratory comng to bedside. Olegario Shearer RN [phone]

## 2018-08-21 NOTE — Progress Notes (Signed)
PAGER ID: [phone]   MESSAGE: [address] [facility] given Faslodex 500mg  in AM, you want another dose of the same?

## 2018-08-21 NOTE — Other (Deleted)
PHYSICIAN???S DOCUMENTATION REQUEST   This Form is Not a Permanent Document in the Medical Record         By submitting this query, we are merely seeking further clarification of documentation to accurately reflect all conditions that you are monitoring, evaluating, treating or that extend the hospitalization or utilize additional resources of care. Please utilize your independent clinical judgment when addressing the question(s) below.    Dear Doctor Sabra Heck,    Documentation by Hospitalist on 08/18/18 states: SIRS. R/o Sepsis. Discontinue Vancomycin today if cultures remain negative and no infection declares itself will likely discontinue Zosyn tomorrow.   Patient admitted with metastatic breast cancer with mets to the bone, liver, and lung. Vasogenic edema, metabolic encephalopathy, severe protein calorie malnutrition and dehydration.       Documentation by Hospitalist on 08/19/18 and 08/20/18  states: Discontinue Zosyn today, Vancomycin stopped yesterday. Monitor off antibiotics . -Antibiotics discontinued, low suspicion for infection. Monitor off antibiotics   Documentation by Hospitalist on 08/21/18 states: sirs r/o Sepsis.     WBC:   08/17/18  6.3  08/19/18  6.3    Lactic acid:  08/17/18  3.2    UA negative nitrites, and leukocyte esterase    Treatment includes: Zosyn, Vancomycin, Palliative care, .............................................................................................................................................    Please specify your intended meaning of 'rule out sepsis' :      Patient has sepsis   ? Please document suspected or confirmed causative organism   ? Please document suspected or confirmed localized infection   ? Please clarify if sepsis was present on admission    Sepsis was ruled out   ? Please provide corresponding diagnosis for patient's clinical picture and associated treatment    Patient had sepsis which is now resolved   Other condition (please specify)   Unable to determine    Unknown      PLEASE DOCUMENT ANY ADDITIONAL DIAGNOSES AND/OR SPECIFICITY IN THE PROGRESS NOTES AND/OR DISCHARGE SUMMARY    Thank you,    Ladell Heads, RN, BSN, [place]   Clinical Documentation Specialist  [facility]   Office: [phone]  Cell: [phone]

## 2018-08-21 NOTE — Progress Notes (Signed)
physical Therapy treatment    Patient: Kayla [name] (77 y.o. 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  Weight bearing precautions: None      ASSESSMENT :  Based on the objective data described below, the patient presents with  Able to participate with LE exercises and sit EOB, limited by confusion today  Generalized muscle weakness affecting function   Decreased Strength  Decreased ADL/Functional Activities  Decreased Transfer Abilities  Decreased Ambulation Ability/Technique  Decreased Balance  Increased Pain  Decreased Activity Tolerance.      Patient's rehabilitation potential is considered to be Good     Recommendations:  Physical Therapy  Discharge Recommendations: SNF, 24 hour supervision for safety, Home with home health PT and TBD, pending progress  Further Equipment Recommendations for Discharge: None        PLAN :  Planned Interventions:  Functional mobility training Gait Training Stair training Balance Training Therapeutic exercises Therapeutic activities Neuro muscular re-education AD training Patient/caregiver education      Frequency/Duration: Patient will be followed by physical therapy 3x / Week x4weeks to address goals.                 Orders reviewed, chart reviewed on [facility].    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 Back pain    . Breast  cancer [facility])          Patient found: Bed, IV and  bed alarm.    Pain Assessment before PT session: Not rated  Pain Location: all over per pt  Pain Assessment after PT session: Not rated  Pain Location:    []           Yes, patient had pain medications  []           No, Patient has not had pain medications  []           Nurse notified    COGNITIVE STATUS:     Mental Status: Oriented to, person, place and confused.  Communication: normal.  Follows commands: intact.  General Cognition: impaired safety and impulsivity.  Hearing: grossly intact.  Vision:  grossly intact.            Functional mobility and balance status:     Supine to sit -  min. assist, mod. assist, verbal cues, set-up, safety concerns, increased time and 1 person  Sit to Supine -  min. assist, mod. assist, verbal cues, manual cues, visual cues, safety concerns, increased time and 1 person   Unable to ambulate or stand  today, pt limited by cognition and lethargy  Ambulation/Gait Training:  unable        Lower Extremities:  Seated, Glut Set, Hamstring Set, Heel Slide, Long arc quad, Straight leg raise, Hip ab/duction, Ankle pumps, BLE, Assisted, 10 reps and Cueing        Activity Tolerance:   fair    Final Location:   bed, bed alarm, all needs close, agrees to call for assistance and nurse notified     COMMUNICATION/EDUCATION:   Education: Patient, Benefit of activity while hospitalized, Call for assistance, Out of bed 2-3 times/day, Staff assistance with mobility, Changes positions frequently, Sit out of bed for 45-60 minutes or as tolerated, Safety, Functional mobility, Demonstrates adequately, Verbalized understanding, Teaching method, Verbal and Role of PT  Barriers to Learning/Limitations: None    Please refer to care plan and patient education section for further details.    Thank you for this referral.  Darnelle Going, PT, DPT  Pager [phone]

## 2018-08-21 NOTE — Progress Notes (Signed)
Late note for 08/20/2018:  DMAS 97 signed by pt's daughter and DMAS 68 signed by pt's daughter.  [name] wants a snf level of care for pt upon dc.  UAI completed and submitted to [place] for review.

## 2018-08-21 NOTE — Progress Notes (Signed)
PAGER ID: [phone]   MESSAGE: [place] pt has a wet chest, wheezing, SOB saturations normal, has PRN nebs, called respiratory comng to bedside. Olegario Shearer RN [phone]

## 2018-08-21 NOTE — Progress Notes (Signed)
[place]  Oncology Progress Note  NAME:  [name], Kayla  SEX:   F  DOB:   [birth year]  DATE: 08/21/2018  REFERRING PHYSICIAN:    MR#    [id number]  LOCATION: RADIATION ONCOLOGY  BILLING#  [id number]    cc:     The patient today underwent treatment #3 of 10 planned treatments to the whole brain for metastatic breast cancer.  She is much more drowsy today, barely arousable.  She does open eyes when transferring from bed to a stretcher.  Third radiation treatment was done without incident.  This totals 900 cGy.  A call was made to patient's daughters, but they have not responded yet.  I will watch her over the weekend and continue treatment on Monday if appropriate.      ___________________  Oliver Barre MD  Dictated By: .   [place]  D:08/21/2018 11:36:00  T: 08/21/2018  [id number]

## 2018-08-21 NOTE — Progress Notes (Signed)
PAGER ID: [phone]   MESSAGE: [phone] [facility] given Faslodex 500mg  in AM, you want another dose of the same?

## 2018-08-21 NOTE — Progress Notes (Signed)
Met with the patients daughter, Becky Sax at the bedside and introduced myself and role w/ the [facility] team.  Updated her on the changes in the patients mental status and breathing today and we engaged in a code status discussion.  Becky Sax was very receptive to the discussion and we reviewed risks involved with CPR.  Education provided on palliative radiation vs curative radiation, ventilators, and provided anticipatory guidance on medical decisions the family should begin to prepare for.  Sonja plans to discuss with her siblings and understands the code status decision to be the priority at this time.  She expressed once she spoke with them she would either contact PC back or inform the bedside nurse with their decision.     Update:  Spoke with Dr. Peter Minium and Dr. Rexford Maus and they both plan to call the daughter Becky Sax to provide an update.

## 2018-08-21 NOTE — Progress Notes (Signed)
 Palliative Care RN Initial Note  Palliative Care Team is available Monday-Friday 8 AM-4:30 PM       NAME:  Kayla [name]   DOB:   1941/06/15   MRN:   [id number]     Date/Time Patient Seen:  08/21/2018 10:14 AM    Attending Physician: Cleotilde Czar, MD  Primary Care Physician: Dany Ozell LABOR, MD        Palliative Assessment/Plan:     Code Status:  Full Code  Goals of Care: Phone calls placed to both daughters and VM's left.  Patient having labored breathing, restless picking at sheets, not able to make eye contact, concerned she is transitioning.  Discussed with Dr. Czar Cleotilde and previous discussions with Dr. Sean family wanting to continue with all aggressive measures including FULL CODE.  PC will continue to try and reach the daughters throughout the day.  RN also aware and if family arrives please contact PC.   Disposition: TBD    Current Capacity for Decision Making:  Unresponsive, not decisional      Advance Care Planning:      Advance Directives:          Medical Power of Attorney: none          Living Will: none          POLST forms:  There is no POLST (Physician orders for life-sustaining treatment) form on file for this patient     No Advance Directive: Surrogate Decision Maker: Kayla [name] (daughter) [phone]; Kayla [name] (daughter) [phone]          The following were updated:  Discussed with Attending Physician/Provider: Cleotilde Czar, MD   Other Physician (consultant): reviewed chart notes   Nursing: RN  Care Manager/Discharge Planner: n/a today      Ronnald Brain RN, BSN, [facility]  Palliative Medicine  [phone] office      The Palliative Medicine team is available Monday to Friday from 8am to 4:30pm at [phone]    Interval History:       HPI:      HPI: Pt is 77 year old female with breast CA followed Dr. Danso in [place] undergoing systemic hormal therapy with faslodex. Admitted on 08/17/18 for AMS; head/abd/pelv/chest CT in ED worrisome for brain with vasogenic edemia, lung, liver  mets; pt undergoing palliative WBR but unable to tolerate lying flat and then later declined to complete simulation mapping to lumbar spine fields per Dr. Marge; planning for x1 dose of faslodex but overall poor prognosis per [facility] Dr. Linnette. Pertinent comorbidities include HTN.  PTA medications per EMR: Lisinopril, fentanyl TD 50mcg/hr, oxycodone IR 10mg  PO Q4hr PRN.  Social History:   Religious/Cultural/Spiritual: CHRISTIAN  Insurance:  Payor: [employer] / Plan: [facility] [employer] MEDICARE / Product Type: Managed Care Medicare /    ECOG Performance Status (current): 4  Prior ECOG: 4  Palliative Performance Scale (PPS): 10-20%  Prior PPS: 20-30%  FAST Scale in Dementia: n/a  PC Consulted ab:Rnwdlouzi by Dr. Juanna Cleotilde  on 9/27  for goals of care, metastatic breast cancer     Review of Systems:   Unable to obtain due to clinical circumstance      Physical Exam:   Blood pressure (!) 156/94, pulse (!) 118, temperature 97.4 F (36.3 C), resp. rate 22, height 5' 6 (1.676 m), weight 57.5 kg (126 lb 12.2 oz), SpO2 99 %.  Body mass index is 20.46 kg/m.  Wt Readings from Last 3 Encounters:   08/18/18 57.5 kg (126  lb 12.2 oz)     Last Bowel Movement: Last Bowel Movement Date: 08/20/18

## 2018-08-21 NOTE — Progress Notes (Signed)
Progress Notes by Matilde Bash, MD at 08/21/18 [phone]                Author: Matilde Bash, MD  Service: ONCOLOGY  Author Type: Physician       Filed: 08/21/18 0845  Date of Service: 08/21/18 0836  Status: Signed          Editor: Matilde Bash, MD (Physician)                     Hematology / Oncology Progress Note   [facility]        Patient: Kayla [name]  MRN:  [id number]         CSN: [id number]      Date of Birth: 1941/08/07        Admit Date: 08/17/2018        Assessment:        Active Problems:     Lactic acidosis (08/17/2018)        Altered mental status (08/17/2018)        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   Brain metastases   Hypercalcemia sec to bone mets   Elevated LFT sec liver mets   Anemia   Hypomagnesemia   Poor PS and nutritional status, failure to thrive     Plan:     ??   1.  Continue present care.   2. Decadron and XRT, reviewed Dr Callie Fielding notes.   3. s/p bisphosphonate. Will monitor labs.   Recently saw Dr Brent General systemic hormonal therapy with faslodex was planned in clinic, will give 1 dose today, overall poor prognosis. Will follow.         Hosie Spangle MD   [facility] [phone]           Subjective:        Patient has no new complaints, had physical therapy, tired sleepy.         Objective:        Visit Vitals      BP  (!) 156/94 (BP 1 Location: Left arm, BP Patient Position: Supine)     Pulse  (!) 118     Temp  97.4 ??F (36.3 ??C)     Resp  22     Ht  5\' 6"  (1.676 m)     Wt  57.5 kg (126 lb 12.2 oz)     SpO2  99%        BMI  20.46 kg/m??                 Temp (24hrs), Avg:97.9 ??F (36.6 ??C), Min:97.4 ??F (36.3 ??C), Max:98.7 ??F (37.1 ??C)            Intake/Output Summary (Last 24 hours) at 08/21/2018 0836   Last data filed at 08/21/2018 4696     Gross per 24 hour        Intake  2198.34 ml        Output  200 ml        Net  1998.34 ml             Current Facility-Administered Medications          Medication   Dose  Route  Frequency           ?  0.9% sodium chloride infusion   100 mL/hr  IntraVENous  CONTINUOUS     ?  morphine injection 1-2  mg   1-2 mg  IntraVENous  Q3H PRN     ?  sodium chloride (NS) flush 5-10 mL   5-10 mL  IntraVENous  Q8H     ?  sodium chloride (NS) flush 5-10 mL   5-10 mL  IntraVENous  PRN     ?  naloxone (NARCAN) injection 0.1 mg   0.1 mg  IntraVENous  PRN     ?  acetaminophen (TYLENOL) tablet 650 mg   650 mg  Oral  Q6H PRN     ?  albuterol (PROVENTIL VENTOLIN) nebulizer solution 2.5 mg   2.5 mg  Nebulization  Q6H PRN     ?  enoxaparin (LOVENOX) injection 40 mg   40 mg  SubCUTAneous  Q24H     ?  influenza vaccine 2019-20 (4 yrs+)(PF) (FLUCELVAX QUAD) injection 0.5 mL   0.5 mL  IntraMUSCular  PRIOR TO DISCHARGE           ?  dexamethasone (DECADRON) 4 mg/mL injection 4 mg   4 mg  IntraVENous  Q6H           ?  potassium bicarbonate (KLYTE) tablet 25 mEq   25 mEq  Oral  BID           Review of Systems   Constitutional:    Fever: Negative, Chills:  Negative Malaise/Fatigue: Negative    Diaphoresis: Negative   Skin:    Rash: Negative,  Itching:  Negative   HENT:    Headache:Negative Nosebleeds:  Negative, Stridor: Negative, Sore Throat:  Negative   Eyes: Jaudice:  negative Blurred Vision: Negative, Double Vision:  Negative Eye Redness: Negative   Cardiovascular:     Chest Pain: Negative,  Palpitations: Negative, Orthopnea: Negative     Leg Swelling: Negative    Respiratory:    Cough: Negative, Hemoptysis:  Negative    Shortness of Breath: Negative, Wheezing:  Negative   Gastrointestinal:   Nausea: Negative, Vomiting:  Negative    Abdominal Pain: Negative, Diarrhea:  Negative, Constipation: Negative    Blood in Stool: Negative   Genitourinary:     Dysuria: Negative, Urgency:  Negative, Frequency: Negative    Hematuria: Negative, Flank Pain:  Negative   Musculoskeletal:     Myalgias: Negative, Neck  Pain: Negative, Back Pain: Negative    Joint Pain: Negative, Falls:  Negative   Neurological:     Dizziness:  Negative, Tingling:  Negative.   Speech Change: Negative, Focal Weakness:  Negative Seizures: Negative, LOC:  Negative      Physical Exam:         Constitutional:  Well nourished, no acute distress and alert and oriented x 3        HENT:  Oral mucosa  moist, no icterus observed.        Eyes:  conjunctiva normal, PERLA.        Neck:  No LAD or jugular venous distension.        Cardiovascular:  heart sounds normal, normal rate and regular rhythm.          Pulmonary/Chest Wall:  breath sounds are clear bilaterally.        Abdominal:  Non tender, no palpable masses, no hepatosplenomegaly         Musculoskeletal:  Normal muscle strength and tone.        Skin:  Normal and no rashes or lesions        CNS  No focal deficits.     Peripheral Vascular:  No edema present in extremities.             Labs:   No results found for this or any previous visit (from the past 24 hour(s)).      Radiology:      Xr Chest Sngl V      Result Date: 08/17/2018   IMPRESSION: Edema versus multifocal pneumonia or chronic changes bilaterally. Cardiomegaly. Postsurgical changes cervical spine.       Ct Head Wo Cont      Result Date: 08/17/2018   Impression: cerebral atrophy and nonspecific white matter changes not unusual for age; multiple lesions as described in brain and calvarium worrisome for metastatic disease on this noncontrast study. Contrast MRI suggested for further evaluation.       Cta Chest W Or W Wo Cont      Result Date: 08/17/2018   Impression: 1. No evidence of pulmonary embolus or aortic dissection. 2. 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 3. Indeterminate lesion right thyroid lobe which could be evaluated with ultrasound. 4. Cardiomegaly. 5. Bilateral  small pleural effusions. 6. Multiple lesions throughout lungs and bones consistent with metastatic disease possibly from breast primary with asymmetric multi centric lesions throughout the right breast and overlying skin thickening with asymmetric upper   normal size right axillary nodes. 7. Compression fractures T3 and T4 with retropulsion. 8. Heterogeneous liver. Metastatic disease not excluded.       Ct Abd Pelv W Cont      Result Date: 08/17/2018   IMPRESSION: 1. 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 Compression fractures L2 through L4. Small acute fracture suspected at superior lateral right acetabular margin. 2. Indeterminate right renal lesion likely represent cyst which  could be confirmed with ultrasound. 3. Left hip replacement. 4. Uterus atrophic or removed. 5. Atherosclerotic vascular disease. 6. Constipation.

## 2018-08-21 NOTE — Progress Notes (Signed)
Adult Malnutrition Guidelines:  SEVERE PROTEIN CALORIE MALNUTRITION IN THE CONTEXT OF ACUTE INJURY/ILLNESS  Weight loss: 14% x 2 months  Energy intake: <50% energy intake compared to estimated energy needs >1 month  Body fat: severe depletion  Muscle mass: severe depletion   Other considerations: breast CA stage 4, brain mass, mets to liver    Please document Severe Protein Calorie Malnutrition in Problem List if in agreement    NUTRITION FOLLOW UP    Current diet order: DIET REGULAR  DIET NUTRITIONAL SUPPLEMENTS Lunch, Dinner; Ensure [employer]  DIET NUTRITIONAL SUPPLEMENTS Dinner; Biochemist, clinical    Vitamin/mineral supplementation: n/a     Other pertinent meds: decadron, lasix, klyte, NS @ 100 ml/hr      Current intake: []  N/A- NPO    []  very poor     []  poor      [x]  fair  (avg)    []  good  % Diet Eaten  Avg: 60 %  Min: 20 %  Max: 100 %    Weight:   Last 3 Recorded Weights in this Encounter    08/17/18 1829 08/18/18 0520   Weight: 55.3 kg (122 lb) 57.5 kg (126 lb 12.2 oz)       BMI: Body mass index is 20.46 kg/m.    Physical assessment:   Chewing/swallowing issues:   +dentures   GI symptoms:    Last Bowel Movement Date: (unknown )   Stool Appearance: Soft   Abdominal Assessment: Soft   Bowel Sounds: Active    Fluid retention:   None       Skin integrity:   Intact        Biochemical data: 9/26 - pert labs reviewed     Assessment of current MNT: Diet appropriate, adequate to meet nutrition needs if po >75% with meals. Supplements to increase calorie/pro intake opportunity     Nutrition diagnosis:   1. Severe acute protein calorie malnutrition related to breast CA stage IV, brain mass, mets to liver noted as evidenced by po <50% x > 1 month, wt loss of 14% x 2 months, mod/severe physical wasting (continues)     Nutrition recommendation: Continue current MNT     Nutrition monitoring: PO intake, diet tolerance and compliance, wt, BS, CMP, hydration, medical changes    Nutrition goals: PO >75% with meals/supplements, wt  stable through LOS, nutrition related lab values WNL     Progress towards nutrition goals: []   Met/Ongoing     [x]   Progressing appropriately       []   Progressing slowly      []   Not Progressing    Nutrition level of care:  []  low     []  moderate     [x]  high    Code status: Full Code    Discharge recommendations: continue current MNT     Discharge Placement: Skilled nursing facility    Monia Sabal, RD  08/21/18

## 2018-08-21 NOTE — Progress Notes (Signed)
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  I think she kind of understands code status   Appreciate palliative cares help       Subjective:   Sob and hurt    ROS:   Review of Systems   Unable to perform ROS: Acuity of condition         Objective:   Physical Exam:     Visit Vitals  BP (!) 156/94 (BP 1 Location: Left arm, BP Patient Position: Supine)   Pulse (!) 118   Temp 97.4 ??F (36.3 ??C)   Resp 22   Ht 5\' 6"  (1.676 m)   Wt 57.5 kg (126 lb 12.2 oz)   SpO2 99%   BMI 20.46 kg/m??      O2 Device: Room air    Temp (24hrs), Avg:97.9 ??F (36.6 ??C), Min:97.4 ??F (36.3 ??C), Max:98.7 ??F (37.1 ??C)    No intake/output data recorded.   09/25 1901 - 09/27 0700  In: 2198.3 [P.O.:320; I.V.:1878.3]  Out: 350 [Urine:350]    Physical Exam   Constitutional: She appears well-developed and well-nourished. She is cooperative.  Non-toxic appearance. She has a sickly appearance. She appears ill.   HENT:   Head: Normocephalic and atraumatic.   Neck: Neck supple.   Cardiovascular: Normal rate and regular rhythm.   Pulmonary/Chest: Breath sounds normal. Stridor present. She is in respiratory distress.   Abdominal: Soft.   Musculoskeletal: She exhibits no tenderness.   Neurological: She is alert.    Skin: No rash noted. No erythema.     Data Review:       24 Hour Results:  No results found for this or any previous visit (from the past 24 hour(s)).    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) ICD-10-CM: C79.9, C50.919  ICD-9-CM: 199.1, 174.9  08/17/2018 - Present               Medications reviewed  Current Facility-Administered Medications   Medication Dose Route Frequency   ??? 0.9% sodium chloride infusion  100 mL/hr IntraVENous CONTINUOUS   ??? morphine injection 1-2 mg  1-2 mg IntraVENous Q3H PRN   ??? sodium chloride (NS) flush  5-10 mL  5-10 mL IntraVENous Q8H   ??? sodium chloride (NS) flush 5-10 mL  5-10 mL IntraVENous PRN   ??? naloxone (NARCAN) injection 0.1 mg  0.1 mg IntraVENous PRN   ??? acetaminophen (TYLENOL) tablet 650 mg  650 mg Oral Q6H PRN   ??? albuterol (PROVENTIL VENTOLIN) nebulizer solution 2.5 mg  2.5 mg Nebulization Q6H PRN   ??? enoxaparin (LOVENOX) injection 40 mg  40 mg SubCUTAneous Q24H   ??? influenza vaccine 2019-20 (4 yrs+)(PF) (FLUCELVAX QUAD) injection 0.5 mL  0.5 mL IntraMUSCular PRIOR TO DISCHARGE   ??? dexamethasone (DECADRON) 4 mg/mL injection 4 mg  4 mg IntraVENous Q6H   ??? potassium bicarbonate (KLYTE) tablet 25 mEq  25 mEq Oral BID        No Known Allergies    Care Plan discussed with: Patient/Family and Nurse    Total time cooedinating care was 45 minutes    Peter Minium, MD  August 21, 2018  8:42 AM

## 2018-08-22 LAB — RENAL FUNCTION PANEL
Albumin: 2.5 gm/dl — ABNORMAL LOW (ref 3.4–5.0)
Albumin: 2.5 gm/dl — ABNORMAL LOW (ref 3.4–5.0)
Anion Gap: 8 mmol/L (ref 5–15)
Anion gap: 8 mmol/L (ref 5–15)
BUN: 29 mg/dl — ABNORMAL HIGH (ref 7–25)
BUN: 29 mg/dl — ABNORMAL HIGH (ref 7–25)
CO2: 26 mEq/L (ref 21–32)
CO2: 26 mEq/L (ref 21–32)
Calcium: 9.2 mg/dl (ref 8.5–10.1)
Calcium: 9.2 mg/dl (ref 8.5–10.1)
Chloride: 106 mEq/L (ref 98–107)
Chloride: 106 mEq/L (ref 98–107)
Creatinine: 0.6 mg/dl (ref 0.6–1.3)
Creatinine: 0.6 mg/dl (ref 0.6–1.3)
EGFR IF NonAfrican American: >60
GFR African American: >60
GFR est AA: >60
GFR est non-AA: >60
Glucose: 122 mg/dl — ABNORMAL HIGH (ref 74–106)
Glucose: 122 mg/dl — ABNORMAL HIGH (ref 74–106)
Phosphorus: 2.3 mg/dl — ABNORMAL LOW (ref 2.5–4.9)
Phosphorus: 2.3 mg/dl — ABNORMAL LOW (ref 2.5–4.9)
Potassium: 3.8 mEq/L (ref 3.5–5.1)
Potassium: 3.8 mEq/L (ref 3.5–5.1)
Sodium: 140 mEq/L (ref 136–145)
Sodium: 140 mEq/L (ref 136–145)

## 2018-08-22 LAB — CALCIUM, IONIZED
CALCIUM,IONIZED: 4.6 mg/dl (ref 4.4–5.4)
CALCIUM,IONIZED: 4.6 mg/dl (ref 4.4–5.4)

## 2018-08-22 MED ORDER — HALOPERIDOL LACTATE 5 MG/ML IJ SOLN
5 mg/mL | Freq: Once | INTRAMUSCULAR | Status: AC
Start: 2018-08-22 — End: 2018-08-22
  Administered 2018-08-22: 08:00:00 via INTRAMUSCULAR

## 2018-08-22 MED FILL — POTASSIUM BICARBONATE-CITRIC ACID 25 MEQ EFFERVESCENT TAB: 25 mEq | ORAL | Qty: 1

## 2018-08-22 MED FILL — ALBUTEROL SULFATE 0.083 % (0.83 MG/ML) SOLN FOR INHALATION: 2.5 mg /3 mL (0.083 %) | RESPIRATORY_TRACT | Qty: 1

## 2018-08-22 MED FILL — DEXAMETHASONE SODIUM PHOSPHATE 4 MG/ML IJ SOLN: 4 mg/mL | INTRAMUSCULAR | Qty: 1

## 2018-08-22 MED FILL — HALOPERIDOL LACTATE 5 MG/ML IJ SOLN: 5 mg/mL | INTRAMUSCULAR | Qty: 1

## 2018-08-22 MED FILL — MORPHINE 2 MG/ML INJECTION: 2 mg/mL | INTRAMUSCULAR | Qty: 1

## 2018-08-22 MED FILL — LOVENOX 40 MG/0.4 ML SUBCUTANEOUS SYRINGE: 40 mg/0.4 mL | SUBCUTANEOUS | Qty: 0.4

## 2018-08-22 NOTE — Progress Notes (Signed)
Problem: Falls - Risk of  Goal: *Absence of Falls  Description  Document Kayla [name] Fall Risk and appropriate interventions in the flowsheet.  Outcome: Progressing Towards Goal  Note:   Fall Risk Interventions:  Mobility Interventions: Bed/chair exit alarm    Mentation Interventions: Family/sitter at bedside, Bed/chair exit alarm    Medication Interventions: Bed/chair exit alarm    Elimination Interventions: Bed/chair exit alarm    History of Falls Interventions: Bed/chair exit alarm

## 2018-08-22 NOTE — Other (Signed)
Bedside shift change report given to [place] (oncoming nurse) by Lanny Cramp (offgoing nurse). Report included the following information SBAR and Kardex.

## 2018-08-22 NOTE — Progress Notes (Signed)
Hi Dr, Kayla [name] is a bit restless and the relatives are requesting something to calm her down. Yesterday she got Haldol but was only a one time. 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 Thanks Lilian 5 E [phone]

## 2018-08-22 NOTE — Progress Notes (Signed)
Patient placed on RA, Sat 100% on 2Lpm. Will continue to monitor.

## 2018-08-22 NOTE — Progress Notes (Signed)
Bedside and Verbal shift change report given to Jonelle Sidle (Soil scientist) by Olegario Shearer (offgoing nurse). Report included the following information SBAR, Kardex, MAR and Recent Results.

## 2018-08-22 NOTE — Progress Notes (Signed)
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    Appreciate palliative cares help really do        Subjective:   Daughter says has ahd a good morning is asleep now more alert   May have some pain    ROS:   Review of Systems   Unable to perform ROS: Acuity of condition         Objective:   Physical Exam:     Visit Vitals  BP 118/58 (BP 1 Location: Right arm, BP Patient Position: Supine)   Pulse (!) 104   Temp 98 ??F (36.7 ??C)   Resp 18   Ht 5\' 6"  (1.676 m)   Wt 57.5 kg (126 lb 12.2 oz)   SpO2 100%   BMI 20.46 kg/m??    O2 Flow Rate (L/min): 2 l/min O2 Device: Room air    Temp (24hrs), Avg:97.8 ??F (36.6 ??C), Min:97.3 ??F (36.3 ??C), Max:98.4 ??F (36.9 ??C)    No intake/output data recorded.   09/26 1901 - 09/28 0700  In: 1386.7 [P.O.:200; I.V.:1186.7]  Out: 850 [Urine:200]    Physical Exam   Constitutional: She appears well-developed and well-nourished. She appears lethargic.  Non-toxic appearance. She has a sickly appearance.   Cardiovascular: Normal rate and regular rhythm.   Pulmonary/Chest: Effort normal and breath sounds normal.    Neurological: She appears lethargic.   sllepy    Skin: She is not diaphoretic.     Data Review:       24 Hour Results:  Recent Results (from the past 24 hour(s))   CALCIUM, IONIZED    Collection Time: 08/22/18  5:52 AM   Result Value Ref Range    CALCIUM,IONIZED 4.6 4.4 - 5.4 mg/dl   RENAL FUNCTION PANEL    Collection Time: 08/22/18  5:52 AM   Result Value Ref Range    Sodium 140 136 - 145 mEq/L    Potassium 3.8 3.5 - 5.1 mEq/L    Chloride 106 98 - 107 mEq/L    CO2 26 21 - 32 mEq/L    Glucose 122 (H) 74 - 106 mg/dl    BUN 29 (H) 7 - 25 mg/dl    Creatinine 0.6 0.6 - 1.3 mg/dl    GFR est AA >60.0      GFR est non-AA >60      Calcium 9.2 8.5 - 10.1 mg/dl  Albumin 2.5 (L) 3.4 - 5.0 gm/dl    Phosphorus 2.3 (L) 2.5 - 4.9 mg/dl    Anion gap 8 5 - 15 mmol/L       Problem List:  Problem List as of 08/22/2018 Never Reviewed          Codes Class Noted - Resolved    Lactic acidosis ICD-10-CM: E87.2  ICD-9-CM: 276.2  08/17/2018 - Present        Altered mental status ICD-10-CM: R41.82  ICD-9-CM: 780.97  08/17/2018 - Present        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 0.9% sodium chloride infusion  100 mL/hr IntraVENous CONTINUOUS   ??? morphine injection 1-2 mg  1-2 mg IntraVENous Q3H PRN   ??? sodium chloride (NS) flush 5-10 mL  5-10 mL IntraVENous Q8H   ??? sodium chloride (NS) flush 5-10 mL  5-10 mL IntraVENous PRN   ??? naloxone (NARCAN) injection 0.1 mg  0.1 mg IntraVENous PRN   ??? acetaminophen (TYLENOL) tablet 650 mg  650 mg Oral Q6H PRN   ??? albuterol (PROVENTIL VENTOLIN) nebulizer solution 2.5 mg  2.5 mg Nebulization Q6H PRN   ??? enoxaparin (LOVENOX) injection 40 mg  40 mg SubCUTAneous Q24H   ??? influenza vaccine 2019-20 (4 yrs+)(PF) (FLUCELVAX QUAD) injection 0.5 mL  0.5 mL IntraMUSCular PRIOR TO DISCHARGE    ??? dexamethasone (DECADRON) 4 mg/mL injection 4 mg  4 mg IntraVENous Q6H   ??? potassium bicarbonate (KLYTE) tablet 25 mEq  25 mEq Oral BID        No Known Allergies    Care Plan discussed with: Patient/Family and Nurse    Total time spent with patient: 30 minutes.    Peter Minium, MD  August 22, 2018  12:00 PM

## 2018-08-22 NOTE — Progress Notes (Signed)
PAGER ID: [phone]   MESSAGE: [place], can you place an IV on [phone], [place]. Preferably upper arm since she likes pulling IVs out. No rush. Thanks! [address] [phone]

## 2018-08-22 NOTE — Progress Notes (Signed)
PAGER ID: [phone]   MESSAGE: [phone] Kayla [name] Pt may benefit more from soft diet than the regular diet she is in right now since she does not have teeth, do you want to put a new diet order?

## 2018-08-22 NOTE — Progress Notes (Signed)
PAGER ID: [phone]   MESSAGE: [phone] Pt may benefit more from soft diet than the regular diet she is in right now since she does not have teeth, do you want to put a new diet order?

## 2018-08-22 NOTE — Progress Notes (Signed)
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    Appreciate palliative cares help really do        Subjective:   Daughter says has ahd a good morning is asleep now more alert   May have some pain    ROS:   Review of Systems   Unable to perform ROS: Acuity of condition         Objective:   Physical Exam:     Visit Vitals  BP 118/58 (BP 1 Location: Right arm, BP Patient Position: Supine)   Pulse (!) 104   Temp 98 ??F (36.7 ??C)   Resp 18   Ht 5\' 6"  (1.676 m)   Wt 57.5 kg (126 lb 12.2 oz)   SpO2 100%   BMI 20.46 kg/m??    O2 Flow Rate (L/min): 2 l/min O2 Device: Room air    Temp (24hrs), Avg:97.8 ??F (36.6 ??C), Min:97.3 ??F (36.3 ??C), Max:98.4 ??F (36.9 ??C)    No intake/output data recorded.   09/26 1901 - 09/28 0700  In: 1386.7 [P.O.:200; I.V.:1186.7]  Out: 850 [Urine:200]    Physical Exam   Constitutional: She appears well-developed and well-nourished. She appears lethargic.  Non-toxic appearance. She has a sickly appearance.   Cardiovascular: Normal rate and regular rhythm.   Pulmonary/Chest: Effort normal and breath sounds normal.    Neurological: She appears lethargic.   sllepy    Skin: She is not diaphoretic.     Data Review:       24 Hour Results:  Recent Results (from the past 24 hour(s))   CALCIUM, IONIZED    Collection Time: 08/22/18  5:52 AM   Result Value Ref Range    CALCIUM,IONIZED 4.6 4.4 - 5.4 mg/dl   RENAL FUNCTION PANEL    Collection Time: 08/22/18  5:52 AM   Result Value Ref Range    Sodium 140 136 - 145 mEq/L    Potassium 3.8 3.5 - 5.1 mEq/L    Chloride 106 98 - 107 mEq/L    CO2 26 21 - 32 mEq/L    Glucose 122 (H) 74 - 106 mg/dl    BUN 29 (H) 7 - 25 mg/dl    Creatinine 0.6 0.6 - 1.3 mg/dl    GFR est AA >60.0      GFR est non-AA >60      Calcium 9.2 8.5 - 10.1 mg/dl  Albumin 2.5 (L) 3.4 - 5.0 gm/dl    Phosphorus 2.3 (L) 2.5 - 4.9 mg/dl    Anion gap 8 5 - 15 mmol/L       Problem List:  Problem List as of 08/22/2018 Never Reviewed          Codes Class Noted - Resolved    Lactic acidosis ICD-10-CM: E87.2  ICD-9-CM: 276.2  08/17/2018 - Present        Altered mental status ICD-10-CM: R41.82  ICD-9-CM: 780.97  08/17/2018 - Present        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 0.9% sodium chloride infusion  100 mL/hr IntraVENous CONTINUOUS   ??? morphine injection 1-2 mg  1-2 mg IntraVENous Q3H PRN   ??? sodium chloride (NS) flush 5-10 mL  5-10 mL IntraVENous Q8H   ??? sodium chloride (NS) flush 5-10 mL  5-10 mL IntraVENous PRN   ??? naloxone (NARCAN) injection 0.1 mg  0.1 mg IntraVENous PRN   ??? acetaminophen (TYLENOL) tablet 650 mg  650 mg Oral Q6H PRN   ??? albuterol (PROVENTIL VENTOLIN) nebulizer solution 2.5 mg  2.5 mg Nebulization Q6H PRN   ??? enoxaparin (LOVENOX) injection 40 mg  40 mg SubCUTAneous Q24H   ??? influenza vaccine 2019-20 (4 yrs+)(PF) (FLUCELVAX QUAD) injection 0.5 mL  0.5 mL IntraMUSCular PRIOR TO DISCHARGE   ??? dexamethasone (DECADRON) 4 mg/mL  injection 4 mg  4 mg IntraVENous Q6H   ??? potassium bicarbonate (KLYTE) tablet 25 mEq  25 mEq Oral BID        No Known Allergies    Care Plan discussed with: Patient/Family and Nurse    Total time spent with patient: 30 minutes.    Peter Minium, MD  August 22, 2018  12:00 PM

## 2018-08-22 NOTE — Progress Notes (Signed)
Problem: Falls - Risk of  Goal: *Absence of Falls  Description  Document Bridgette Habermann Fall Risk and appropriate interventions in the flowsheet.  Outcome: Progressing Towards Goal  Note:   Fall Risk Interventions:  Mobility Interventions: Bed/chair exit alarm    Mentation Interventions: Family/sitter at bedside, Bed/chair exit alarm    Medication Interventions: Bed/chair exit alarm    Elimination Interventions: Bed/chair exit alarm    History of Falls Interventions: Bed/chair exit alarm

## 2018-08-22 NOTE — Progress Notes (Signed)
Bedside and Verbal shift change report given to Maurine Minister (Cabin crew) by Rubin Payor (offgoing nurse). Report included the following information SBAR, Kardex, MAR and Recent Results.

## 2018-08-22 NOTE — Progress Notes (Signed)
PAGER ID: [phone]   MESSAGE: [place], can you place an IV on [address], Kayla [name]. Preferably upper arm since she likes pulling IVs out. No rush. Thanks! [id number]

## 2018-08-22 NOTE — Progress Notes (Signed)
Hi Dr, Mrs Kocsis is a bit restless and the relatives are requesting something to calm her down. Yesterday she got Haldol but was only a one time. 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 Thanks Lilian [phone]

## 2018-08-23 LAB — CBC WITH AUTOMATED DIFF
BASOPHILS: 0 % (ref 0–3)
EOSINOPHILS: 0 % (ref 0–5)
HCT: 30.1 % — ABNORMAL LOW (ref 37.0–50.0)
HGB: 9.6 gm/dl — ABNORMAL LOW (ref 13.0–17.2)
IMMATURE GRANULOCYTES: 1.9 % (ref 0.0–3.0)
LYMPHOCYTES: 4.2 % — ABNORMAL LOW (ref 28–48)
MCH: 28.6 pg (ref 25.4–34.6)
MCHC: 31.9 gm/dl (ref 30.0–36.0)
MCV: 89.6 fL (ref 80.0–98.0)
MONOCYTES: 7.3 % (ref 1–13)
MPV: 10.2 fL — ABNORMAL HIGH (ref 6.0–10.0)
NEUTROPHILS: 86.6 % — ABNORMAL HIGH (ref 34–64)
NRBC: 2 — ABNORMAL HIGH (ref 0–0)
PLATELET: 211 10*3/uL (ref 140–450)
RBC: 3.36 M/uL — ABNORMAL LOW (ref 3.60–5.20)
RDW-SD: 59.3 — ABNORMAL HIGH (ref 36.4–46.3)
WBC: 4.8 10*3/uL (ref 4.0–11.0)

## 2018-08-23 LAB — METABOLIC PANEL, COMPREHENSIVE
ALT (SGPT): 123 U/L — ABNORMAL HIGH (ref 12–78)
AST (SGOT): 740 U/L — ABNORMAL HIGH (ref 15–37)
Albumin: 2.4 gm/dl — ABNORMAL LOW (ref 3.4–5.0)
Alk. phosphatase: 881 U/L — ABNORMAL HIGH (ref 45–117)
Anion gap: 10 mmol/L (ref 5–15)
BUN: 26 mg/dl — ABNORMAL HIGH (ref 7–25)
Bilirubin, total: 2.8 mg/dl — ABNORMAL HIGH (ref 0.2–1.0)
CO2: 24 mEq/L (ref 21–32)
Calcium: 9 mg/dl (ref 8.5–10.1)
Chloride: 106 mEq/L (ref 98–107)
Creatinine: 0.6 mg/dl (ref 0.6–1.3)
GFR est AA: >60
GFR est non-AA: >60
Glucose: 139 mg/dl — ABNORMAL HIGH (ref 74–106)
Potassium: 3.7 mEq/L (ref 3.5–5.1)
Protein, total: 6.2 gm/dl — ABNORMAL LOW (ref 6.4–8.2)
Sodium: 140 mEq/L (ref 136–145)

## 2018-08-23 LAB — PHOSPHORUS
Phosphorus: 1.9 mg/dl — ABNORMAL LOW (ref 2.5–4.9)
Phosphorus: 1.9 mg/dl — ABNORMAL LOW (ref 2.5–4.9)

## 2018-08-23 LAB — CALCIUM, IONIZED
CALCIUM,IONIZED: 4.3 mg/dl — ABNORMAL LOW (ref 4.4–5.4)
CALCIUM,IONIZED: 4.3 mg/dl — ABNORMAL LOW (ref 4.4–5.4)

## 2018-08-23 LAB — COMPREHENSIVE METABOLIC PANEL
ALT: 123 U/L — ABNORMAL HIGH (ref 12–78)
AST: 740 U/L — ABNORMAL HIGH (ref 15–37)
Albumin: 2.4 gm/dl — ABNORMAL LOW (ref 3.4–5.0)
Alkaline Phosphatase: 881 U/L — ABNORMAL HIGH (ref 45–117)
Anion Gap: 10 mmol/L (ref 5–15)
BUN: 26 mg/dl — ABNORMAL HIGH (ref 7–25)
CO2: 24 mEq/L (ref 21–32)
Calcium: 9 mg/dl (ref 8.5–10.1)
Chloride: 106 mEq/L (ref 98–107)
Creatinine: 0.6 mg/dl (ref 0.6–1.3)
EGFR IF NonAfrican American: >60
GFR African American: >60
Glucose: 139 mg/dl — ABNORMAL HIGH (ref 74–106)
Potassium: 3.7 mEq/L (ref 3.5–5.1)
Sodium: 140 mEq/L (ref 136–145)
Total Bilirubin: 2.8 mg/dl — ABNORMAL HIGH (ref 0.2–1.0)
Total Protein: 6.2 gm/dl — ABNORMAL LOW (ref 6.4–8.2)

## 2018-08-23 LAB — CBC WITH AUTO DIFFERENTIAL
Basophils %: 0 % (ref 0–3)
Eosinophils %: 0 % (ref 0–5)
Hematocrit: 30.1 % — ABNORMAL LOW (ref 37.0–50.0)
Hemoglobin: 9.6 gm/dl — ABNORMAL LOW (ref 13.0–17.2)
Immature Granulocytes: 1.9 % (ref 0.0–3.0)
Lymphocytes %: 4.2 % — ABNORMAL LOW (ref 28–48)
MCH: 28.6 pg (ref 25.4–34.6)
MCHC: 31.9 gm/dl (ref 30.0–36.0)
MCV: 89.6 fL (ref 80.0–98.0)
MPV: 10.2 fL — ABNORMAL HIGH (ref 6.0–10.0)
Monocytes %: 7.3 % (ref 1–13)
Neutrophils %: 86.6 % — ABNORMAL HIGH (ref 34–64)
Nucleated RBCs: 2 — ABNORMAL HIGH (ref 0–0)
Platelets: 211 10*3/uL (ref 140–450)
RBC: 3.36 M/uL — ABNORMAL LOW (ref 3.60–5.20)
RDW-SD: 59.3 — ABNORMAL HIGH (ref 36.4–46.3)
WBC: 4.8 10*3/uL (ref 4.0–11.0)

## 2018-08-23 MED ORDER — ALBUTEROL SULFATE 0.083 % (0.83 MG/ML) SOLN FOR INHALATION
2.5 mg /3 mL (0.083 %) | Freq: Four times a day (QID) | RESPIRATORY_TRACT | Status: DC
Start: 2018-08-23 — End: 2018-08-23
  Administered 2018-08-23 – 2018-08-24 (×4): via RESPIRATORY_TRACT

## 2018-08-23 MED ORDER — HALOPERIDOL LACTATE 5 MG/ML IJ SOLN
5 mg/mL | Freq: Once | INTRAMUSCULAR | Status: AC
Start: 2018-08-23 — End: 2018-08-22
  Administered 2018-08-23: 02:00:00 via INTRAMUSCULAR

## 2018-08-23 MED ORDER — HALOPERIDOL 1 MG TAB
1 mg | Freq: Every evening | ORAL | Status: DC | PRN
Start: 2018-08-23 — End: 2018-09-02
  Administered 2018-08-24 – 2018-08-29 (×5): via ORAL

## 2018-08-23 MED ORDER — MELATONIN 3 MG TAB
3 mg | Freq: Every evening | ORAL | Status: DC
Start: 2018-08-23 — End: 2018-09-03
  Administered 2018-08-24 – 2018-09-03 (×11): via ORAL

## 2018-08-23 MED FILL — BD POSIFLUSH NORMAL SALINE 0.9 % INJECTION SYRINGE: INTRAMUSCULAR | Qty: 10

## 2018-08-23 MED FILL — MORPHINE 2 MG/ML INJECTION: 2 mg/mL | INTRAMUSCULAR | Qty: 1

## 2018-08-23 MED FILL — ENOXAPARIN 40 MG/0.4 ML SUB-Q SYRINGE: 40 mg/0.4 mL | SUBCUTANEOUS | Qty: 0.4

## 2018-08-23 MED FILL — DEXAMETHASONE SODIUM PHOSPHATE 4 MG/ML IJ SOLN: 4 mg/mL | INTRAMUSCULAR | Qty: 1

## 2018-08-23 MED FILL — HALOPERIDOL LACTATE 5 MG/ML IJ SOLN: 5 mg/mL | INTRAMUSCULAR | Qty: 1

## 2018-08-23 MED FILL — ALBUTEROL SULFATE 0.083 % (0.83 MG/ML) SOLN FOR INHALATION: 2.5 mg /3 mL (0.083 %) | RESPIRATORY_TRACT | Qty: 1

## 2018-08-23 MED FILL — POTASSIUM BICARBONATE-CITRIC ACID 25 MEQ EFFERVESCENT TAB: 25 mEq | ORAL | Qty: 1

## 2018-08-23 NOTE — Other (Signed)
Bedside shift change report given to Jennifer Walker-Brown (oncoming nurse) by Amy RN (offgoing nurse). Report included the following information SBAR.

## 2018-08-23 NOTE — Progress Notes (Signed)
Problem: Falls - Risk of  Goal: *Absence of Falls  Description  Document Kayla [name] Fall Risk and appropriate interventions in the flowsheet.  Outcome: Progressing Towards Goal  Note:   Fall Risk Interventions:  Mobility Interventions: Bed/chair exit alarm, Patient to call before getting OOB    Mentation Interventions: Adequate sleep, hydration, pain control, Bed/chair exit alarm, Door open when patient unattended    Medication Interventions: Bed/chair exit alarm, Patient to call before getting OOB    Elimination Interventions: Bed/chair exit alarm, Call light in reach    History of Falls Interventions: Bed/chair exit alarm, Door open when patient unattended, Room close to nurse's station         Problem: Patient Education: Go to Patient Education Activity  Goal: Patient/Family Education  Outcome: Progressing Towards Goal     Problem: Pressure Injury - Risk of  Goal: *Prevention of pressure injury  Description  Document Braden Scale and appropriate interventions in the flowsheet.  Outcome: Progressing Towards Goal  Note:   Pressure Injury Interventions:  Sensory Interventions: Assess changes in LOC, Keep linens dry and wrinkle-free    Moisture Interventions: Absorbent underpads, Internal/External urinary devices    Activity Interventions: Pressure redistribution bed/mattress(bed type)    Mobility Interventions: Pressure redistribution bed/mattress (bed type)    Nutrition Interventions: Document food/fluid/supplement intake    Friction and Shear Interventions: Apply protective barrier, creams and emollients                Problem: Patient Education: Go to Patient Education Activity  Goal: Patient/Family Education  Outcome: Progressing Towards Goal     Problem: Breathing Pattern - Ineffective  Goal: *Absence of hypoxia  Outcome: Progressing Towards Goal  Goal: *Use of effective breathing techniques  Outcome: Progressing Towards Goal  Goal: *PALLIATIVE CARE:  Alleviation of Dyspnea  Outcome: Progressing Towards Goal      Problem: Gas Exchange - Impaired  Goal: *Absence of hypoxia  Outcome: Progressing Towards Goal     Problem: Patient Education: Go to Patient Education Activity  Goal: Patient/Family Education  Outcome: Progressing Towards Goal

## 2018-08-23 NOTE — Progress Notes (Signed)
Bedside shift change report given to Amy, RN (oncoming nurse) by Judeth Porch, RN (offgoing nurse). Report included the following information SBAR.

## 2018-08-23 NOTE — Progress Notes (Signed)
Hospitalist Progress Note               NAME:  Kayla [name]   DOB:   [birth year]   MRN:   [id number]     PCP:  Henrene Pastor, MD  Date/Time:  08/23/2018 8:48 AM      Assessment/Plan:   Vasogenic cerebral edema from brain mass obviously metastatic breast cancer has metabolic encephalopathy from this  Is getting steroids and xrt, discussed with daughter the xrt should help not make her worse on Saturday Sure wis onc was here to explain better hopefully f/u Monday  Today I spoke to son  And 2 daughters today and explained situation  She looks lot better but think prognosis is aweful  ??  Dehydration    Dyspnea -- think this is mostly metastatic dz not failure or copd  ??  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  Hypercalcemia coming down  ??  SIRS, ruled out????Sepsis   ??  Severe protein calorie malnutrition??    PT/OT ordered    Melatonin for sleep and overnight help  ??  Appreciate palliative cares help really do        Subjective:   Says feels better today     ROS:   ROS  Restless at night  Bowels so far ok     Objective:   Physical Exam:     Visit Vitals  BP 146/87 (BP 1 Location: Right arm, BP Patient Position: Supine)   Pulse (!) 109   Temp 97.2 ??F (36.2 ??C)   Resp 20   Ht _0  (1.676 m)   Wt 57.5 kg (126 lb 12.2 oz)   SpO2 100%   BMI 20.46 kg/m??    O2 Flow Rate (L/min): 1 l/min O2 Device: Room air    Temp (24hrs), Avg:96.8 ??F (36 ??C), Min:93.9 ??F (34.4 ??C), Max:97.5 ??F (36.4 ??C)    No intake/output data recorded.   09/27 1901 - 09/29 0700  In: 1597.5 [P.O.:440; I.V.:1157.5]  Out: 800 [Urine:800]    Physical Exam    Constitutional: She appears well-developed and well-nourished. She appears ill.   Eyes: No scleral icterus.   Neck: Neck supple.   Cardiovascular: Normal rate, regular rhythm and normal heart sounds.   Pulmonary/Chest: Effort normal and breath sounds normal.   Abdominal: Soft. Bowel sounds are normal.   Neurological: She is alert.   Skin: She is not diaphoretic. No erythema.   Psychiatric: She has a normal mood and affect.     Data Review:       24 Hour Results:  Recent Results (from the past 24 hour(s))   CALCIUM, IONIZED    Collection Time: 08/23/18  5:59 AM   Result Value Ref Range    CALCIUM,IONIZED 4.3 (L) 4.4 - 5.4 mg/dl   METABOLIC PANEL, COMPREHENSIVE    Collection Time: 08/23/18  5:59 AM   Result Value Ref Range    Sodium 140 136 - 145 mEq/L    Potassium 3.7 3.5 - 5.1 mEq/L    Chloride 106 98 - 107 mEq/L  CO2 24 21 - 32 mEq/L    Glucose 139 (H) 74 - 106 mg/dl    BUN 26 (H) 7 - 25 mg/dl    Creatinine 0.6 0.6 - 1.3 mg/dl    GFR est AA >60.0      GFR est non-AA >60      Calcium 9.0 8.5 - 10.1 mg/dl    AST (SGOT) 740 (H) 15 - 37 U/L    ALT (SGPT) 123 (H) 12 - 78 U/L    Alk. phosphatase 881 (H) 45 - 117 U/L    Bilirubin, total 2.8 (H) 0.2 - 1.0 mg/dl    Protein, total 6.2 (L) 6.4 - 8.2 gm/dl    Albumin 2.4 (L) 3.4 - 5.0 gm/dl    Anion gap 10 5 - 15 mmol/L   CBC WITH AUTOMATED DIFF    Collection Time: 08/23/18  5:59 AM   Result Value Ref Range    WBC 4.8 4.0 - 11.0 1000/mm3    RBC 3.36 (L) 3.60 - 5.20 M/uL    HGB 9.6 (L) 13.0 - 17.2 gm/dl    HCT 30.1 (L) 37.0 - 50.0 %    MCV 89.6 80.0 - 98.0 fL    MCH 28.6 25.4 - 34.6 pg    MCHC 31.9 30.0 - 36.0 gm/dl    PLATELET 211 140 - 450 1000/mm3    MPV 10.2 (H) 6.0 - 10.0 fL    RDW-SD 59.3 (H) 36.4 - 46.3      NRBC 2 (H) 0 - 0      IMMATURE GRANULOCYTES 1.9 0.0 - 3.0 %    NEUTROPHILS 86.6 (H) 34 - 64 %    LYMPHOCYTES 4.2 (L) 28 - 48 %    MONOCYTES 7.3 1 - 13 %    EOSINOPHILS 0.0 0 - 5 %    BASOPHILS 0.0 0 - 3 %   PHOSPHORUS    Collection Time: 08/23/18  5:59 AM    Result Value Ref Range    Phosphorus 1.9 (L) 2.5 - 4.9 mg/dl       Problem List:  Problem List as of 08/23/2018 Never Reviewed          Codes Class Noted - Resolved    Lactic acidosis ICD-10-CM: E87.2  ICD-9-CM: 276.2  08/17/2018 - Present        Altered mental status ICD-10-CM: R41.82  ICD-9-CM: 780.97  08/17/2018 - Present        Metastasis from breast cancer [facility]) ICD-10-CM: C79.9, C50.919  ICD-9-CM: 199.1, 174.9  08/17/2018 - Present               Medications reviewed  Current Facility-Administered Medications   Medication Dose Route Frequency   ??? albuterol (PROVENTIL VENTOLIN) nebulizer solution 2.5 mg  2.5 mg Nebulization Q6H RT   ??? 0.9% sodium chloride infusion  75 mL/hr IntraVENous CONTINUOUS   ??? morphine injection 1-2 mg  1-2 mg IntraVENous Q3H PRN   ??? sodium chloride (NS) flush 5-10 mL  5-10 mL IntraVENous Q8H   ??? sodium chloride (NS) flush 5-10 mL  5-10 mL IntraVENous PRN   ??? naloxone (NARCAN) injection 0.1 mg  0.1 mg IntraVENous PRN   ??? acetaminophen (TYLENOL) tablet 650 mg  650 mg Oral Q6H PRN   ??? enoxaparin (LOVENOX) injection 40 mg  40 mg SubCUTAneous Q24H   ??? influenza vaccine 2019-20 (4 yrs+)(PF) (FLUCELVAX QUAD) injection 0.5 mL  0.5 mL IntraMUSCular PRIOR TO DISCHARGE   ??? dexamethasone (DECADRON) 4 mg/mL injection  4 mg  4 mg IntraVENous Q6H   ??? potassium bicarbonate (KLYTE) tablet 25 mEq  25 mEq Oral BID        No Known Allergies    Care Plan discussed with: Patient/Family and Nurse    Total time spent with patient: 40 minutes.    Peter Minium, MD  August 23, 2018  8:48 AM

## 2018-08-23 NOTE — Progress Notes (Signed)
Pulled her IV access. Paged [facility] team to put one up her arms so that its out of her sight.

## 2018-08-23 NOTE — Progress Notes (Signed)
IV re inserted by the [facility] team, Infusion reconnected.

## 2018-08-23 NOTE — Progress Notes (Signed)
Hospitalist Progress Note               NAME:  Kayla [name]   DOB:   06/10/1941   MRN:   [id number]     PCP:  Henrene Pastor, MD  Date/Time:  08/23/2018 8:48 AM      Assessment/Plan:   Vasogenic cerebral edema from brain mass obviously metastatic breast cancer has metabolic encephalopathy from this  Is getting steroids and xrt, discussed with daughter the xrt should help not make her worse on Saturday Sure wis onc was here to explain better hopefully f/u Monday  Today I spoke to son  And 2 daughters today and explained situation  She looks lot better but think prognosis is aweful  ??  Dehydration    Dyspnea -- think this is mostly metastatic dz not failure or copd  ??  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  Hypercalcemia coming down  ??  SIRS, ruled out????Sepsis   ??  Severe protein calorie malnutrition??    PT/OT ordered    Melatonin for sleep and overnight help  ??  Appreciate palliative cares help really do        Subjective:   Says feels better today     ROS:   ROS  Restless at night  Bowels so far ok     Objective:   Physical Exam:     Visit Vitals  BP 146/87 (BP 1 Location: Right arm, BP Patient Position: Supine)   Pulse (!) 109   Temp 97.2 ??F (36.2 ??C)   Resp 20   Ht '5\' 6"'$  (1.676 m)   Wt 57.5 kg (126 lb 12.2 oz)   SpO2 100%   BMI 20.46 kg/m??    O2 Flow Rate (L/min): 1 l/min O2 Device: Room air    Temp (24hrs), Avg:96.8 ??F (36 ??C), Min:93.9 ??F (34.4 ??C), Max:97.5 ??F (36.4 ??C)    No intake/output data recorded.   09/27 1901 - 09/29 0700  In: 1597.5 [P.O.:440; I.V.:1157.5]  Out: 800 [Urine:800]    Physical Exam   Constitutional:  She appears well-developed and well-nourished. She appears ill.   Eyes: No scleral icterus.   Neck: Neck supple.   Cardiovascular: Normal rate, regular rhythm and normal heart sounds.   Pulmonary/Chest: Effort normal and breath sounds normal.   Abdominal: Soft. Bowel sounds are normal.   Neurological: She is alert.   Skin: She is not diaphoretic. No erythema.   Psychiatric: She has a normal mood and affect.     Data Review:       24 Hour Results:  Recent Results (from the past 24 hour(s))   CALCIUM, IONIZED    Collection Time: 08/23/18  5:59 AM   Result Value Ref Range    CALCIUM,IONIZED 4.3 (L) 4.4 - 5.4 mg/dl   METABOLIC PANEL, COMPREHENSIVE    Collection Time: 08/23/18  5:59 AM   Result Value Ref Range    Sodium 140 136 - 145 mEq/L    Potassium 3.7 3.5 - 5.1 mEq/L    Chloride 106 98 - 107 mEq/L  CO2 24 21 - 32 mEq/L    Glucose 139 (H) 74 - 106 mg/dl    BUN 26 (H) 7 - 25 mg/dl    Creatinine 0.6 0.6 - 1.3 mg/dl    GFR est AA >60.0      GFR est non-AA >60      Calcium 9.0 8.5 - 10.1 mg/dl    AST (SGOT) 740 (H) 15 - 37 U/L    ALT (SGPT) 123 (H) 12 - 78 U/L    Alk. phosphatase 881 (H) 45 - 117 U/L    Bilirubin, total 2.8 (H) 0.2 - 1.0 mg/dl    Protein, total 6.2 (L) 6.4 - 8.2 gm/dl    Albumin 2.4 (L) 3.4 - 5.0 gm/dl    Anion gap 10 5 - 15 mmol/L   CBC WITH AUTOMATED DIFF    Collection Time: 08/23/18  5:59 AM   Result Value Ref Range    WBC 4.8 4.0 - 11.0 1000/mm3    RBC 3.36 (L) 3.60 - 5.20 M/uL    HGB 9.6 (L) 13.0 - 17.2 gm/dl    HCT 30.1 (L) 37.0 - 50.0 %    MCV 89.6 80.0 - 98.0 fL    MCH 28.6 25.4 - 34.6 pg    MCHC 31.9 30.0 - 36.0 gm/dl    PLATELET 211 140 - 450 1000/mm3    MPV 10.2 (H) 6.0 - 10.0 fL    RDW-SD 59.3 (H) 36.4 - 46.3      NRBC 2 (H) 0 - 0      IMMATURE GRANULOCYTES 1.9 0.0 - 3.0 %    NEUTROPHILS 86.6 (H) 34 - 64 %    LYMPHOCYTES 4.2 (L) 28 - 48 %    MONOCYTES 7.3 1 - 13 %    EOSINOPHILS 0.0 0 - 5 %    BASOPHILS 0.0 0 - 3 %   PHOSPHORUS    Collection Time: 08/23/18  5:59 AM   Result Value Ref Range     Phosphorus 1.9 (L) 2.5 - 4.9 mg/dl       Problem List:  Problem List as of 08/23/2018 Never Reviewed          Codes Class Noted - Resolved    Lactic acidosis ICD-10-CM: E87.2  ICD-9-CM: 276.2  08/17/2018 - Present        Altered mental status ICD-10-CM: R41.82  ICD-9-CM: 780.97  08/17/2018 - Present        Metastasis from breast cancer [facility]) ICD-10-CM: C79.9, C50.919  ICD-9-CM: 199.1, 174.9  08/17/2018 - Present               Medications reviewed  Current Facility-Administered Medications   Medication Dose Route Frequency   ??? albuterol (PROVENTIL VENTOLIN) nebulizer solution 2.5 mg  2.5 mg Nebulization Q6H RT   ??? 0.9% sodium chloride infusion  75 mL/hr IntraVENous CONTINUOUS   ??? morphine injection 1-2 mg  1-2 mg IntraVENous Q3H PRN   ??? sodium chloride (NS) flush 5-10 mL  5-10 mL IntraVENous Q8H   ??? sodium chloride (NS) flush 5-10 mL  5-10 mL IntraVENous PRN   ??? naloxone (NARCAN) injection 0.1 mg  0.1 mg IntraVENous PRN   ??? acetaminophen (TYLENOL) tablet 650 mg  650 mg Oral Q6H PRN   ??? enoxaparin (LOVENOX) injection 40 mg  40 mg SubCUTAneous Q24H   ??? influenza vaccine 2019-20 (4 yrs+)(PF) (FLUCELVAX QUAD) injection 0.5 mL  0.5 mL IntraMUSCular PRIOR TO DISCHARGE   ??? dexamethasone (DECADRON) 4 mg/mL injection  4 mg  4 mg IntraVENous Q6H   ??? potassium bicarbonate (KLYTE) tablet 25 mEq  25 mEq Oral BID        No Known Allergies    Care Plan discussed with: Patient/Family and Nurse    Total time spent with patient: 40 minutes.    Peter Minium, MD  August 23, 2018  8:48 AM

## 2018-08-23 NOTE — Progress Notes (Signed)
Bedside shift change report given to Amy, RN (oncoming nurse) by Gardiner Ramus, RN (offgoing nurse). Report included the following information SBAR.

## 2018-08-24 ENCOUNTER — Inpatient Hospital Stay: Admit: 2018-08-24 | Payer: MEDICARE | Primary: Hematology & Oncology

## 2018-08-24 LAB — CALCIUM, IONIZED
CALCIUM,IONIZED: 4.3 mg/dl — ABNORMAL LOW (ref 4.4–5.4)
CALCIUM,IONIZED: 4.3 mg/dl — ABNORMAL LOW (ref 4.4–5.4)
CALCIUM,IONIZED: 4.5 mg/dl (ref 4.4–5.4)
CALCIUM,IONIZED: 4.5 mg/dl (ref 4.4–5.4)

## 2018-08-24 LAB — POC BLOOD GAS + LACTIC ACID
BASE EXCESS: 1 mmol/L (ref <=3)
BICARBONATE: 24.5 mmol/L (ref 18.0–26.0)
Base Excess: 1 mmol/L (ref <=3)
CO2 Total: 25 mmol/L (ref 24–29)
CO2, TOTAL: 25 mmol/L (ref 24–29)
HCO3: 24.5 mmol/L (ref 18.0–26.0)
LACTIC ACID: 3.82 mmol/L — ABNORMAL HIGH (ref 0.40–2.00)
Lactic Acid: 3.82 mmol/L — ABNORMAL HIGH (ref 0.40–2.00)
O2 SAT: 95 % (ref 90–100)
O2 Sat: 95 % (ref 90–100)
PCO2: 32.6 mm Hg — ABNORMAL LOW (ref 35.0–45.0)
PCO2: 32.6 mm Hg — ABNORMAL LOW (ref 35.0–45.0)
PO2: 70 mm Hg — ABNORMAL LOW (ref 75–100)
PO2: 70 mm Hg — ABNORMAL LOW (ref 75–100)
Patient temp.: 98.6
Patient temperature: 98.6
pH: 7.483 — ABNORMAL HIGH (ref 7.350–7.450)
pH: 7.483 — ABNORMAL HIGH (ref 7.350–7.450)

## 2018-08-24 LAB — RENAL FUNCTION PANEL
Albumin: 2.7 gm/dl — ABNORMAL LOW (ref 3.4–5.0)
Albumin: 2.7 gm/dl — ABNORMAL LOW (ref 3.4–5.0)
Anion Gap: 10 mmol/L (ref 5–15)
Anion gap: 10 mmol/L (ref 5–15)
BUN: 22 mg/dl (ref 7–25)
BUN: 22 mg/dl (ref 7–25)
CO2: 26 mEq/L (ref 21–32)
CO2: 26 mEq/L (ref 21–32)
Calcium: 9.1 mg/dl (ref 8.5–10.1)
Calcium: 9.1 mg/dl (ref 8.5–10.1)
Chloride: 104 mEq/L (ref 98–107)
Chloride: 104 mEq/L (ref 98–107)
Creatinine: 0.8 mg/dl (ref 0.6–1.3)
Creatinine: 0.8 mg/dl (ref 0.6–1.3)
EGFR IF NonAfrican American: >60
GFR African American: >60
GFR est AA: >60
GFR est non-AA: >60
Glucose: 185 mg/dl — ABNORMAL HIGH (ref 74–106)
Glucose: 185 mg/dl — ABNORMAL HIGH (ref 74–106)
Phosphorus: 1.5 mg/dl — ABNORMAL LOW (ref 2.5–4.9)
Phosphorus: 1.5 mg/dl — ABNORMAL LOW (ref 2.5–4.9)
Potassium: 3.6 mEq/L (ref 3.5–5.1)
Potassium: 3.6 mEq/L (ref 3.5–5.1)
Sodium: 140 mEq/L (ref 136–145)
Sodium: 140 mEq/L (ref 136–145)

## 2018-08-24 LAB — LACTIC ACID
LACTIC ACID: 5 mmol/L (ref 0.4–2.0)
Lactic Acid: 5 mmol/L — CR (ref 0.4–2.0)

## 2018-08-24 LAB — PHOSPHORUS
Phosphorus: 1.8 mg/dl — ABNORMAL LOW (ref 2.5–4.9)
Phosphorus: 1.8 mg/dl — ABNORMAL LOW (ref 2.5–4.9)

## 2018-08-24 MED ORDER — CALCIUM GLUCONATE 1 GRAM/50 ML IN SODIUM CHLORIDE, ISO-OSM IV SOLUTION
1 gram/50 mL | Freq: Once | INTRAVENOUS | Status: AC
Start: 2018-08-24 — End: 2018-08-24
  Administered 2018-08-24: 13:00:00 via INTRAVENOUS

## 2018-08-24 MED ORDER — SODIUM CHLORIDE 0.9 % IV
INTRAVENOUS | Status: DC
Start: 2018-08-24 — End: 2018-08-24
  Administered 2018-08-24: 18:00:00 via INTRAVENOUS

## 2018-08-24 MED ORDER — LEVALBUTEROL 1.25 MG/3 ML SOLN FOR INHALATION
1.25 mg/3 mL | RESPIRATORY_TRACT | Status: DC
Start: 2018-08-24 — End: 2018-08-24
  Administered 2018-08-24 (×3): via RESPIRATORY_TRACT

## 2018-08-24 MED ORDER — ELECTROLYTE REPLACEMENT PROTOCOL
Status: DC | PRN
Start: 2018-08-24 — End: 2018-08-25

## 2018-08-24 MED ORDER — LEVALBUTEROL 1.25 MG/3 ML SOLN FOR INHALATION
1.25 mg/3 mL | RESPIRATORY_TRACT | Status: DC | PRN
Start: 2018-08-24 — End: 2018-08-24

## 2018-08-24 MED ORDER — ALBUTEROL SULFATE 0.083 % (0.83 MG/ML) SOLN FOR INHALATION
2.5 mg /3 mL (0.083 %) | Freq: Four times a day (QID) | RESPIRATORY_TRACT | Status: DC | PRN
Start: 2018-08-24 — End: 2018-08-24

## 2018-08-24 MED ORDER — FUROSEMIDE 10 MG/ML IJ SOLN
10 mg/mL | Freq: Once | INTRAMUSCULAR | Status: AC
Start: 2018-08-24 — End: 2018-08-24
  Administered 2018-08-24: 11:00:00 via INTRAVENOUS

## 2018-08-24 MED ORDER — CALCIUM GLUCONATE 100 MG/ML (10%) IV SOLN
100 mg/mL (10%) | Freq: Once | INTRAVENOUS | Status: DC
Start: 2018-08-24 — End: 2018-08-24

## 2018-08-24 MED ORDER — LEVALBUTEROL 1.25 MG/3 ML SOLN FOR INHALATION
1.25 mg/3 mL | RESPIRATORY_TRACT | Status: DC | PRN
Start: 2018-08-24 — End: 2018-09-02
  Administered 2018-08-25 – 2018-08-26 (×4): via RESPIRATORY_TRACT

## 2018-08-24 MED ORDER — SODIUM CHLORIDE 0.9% BOLUS IV
0.9 % | Freq: Once | INTRAVENOUS | Status: DC
Start: 2018-08-24 — End: 2018-08-24
  Administered 2018-08-24: 18:00:00 via INTRAVENOUS

## 2018-08-24 MED ORDER — SODIUM PHOSPHATE 3 MMOLE/ML IV
3 mmol/mL | Freq: Once | INTRAVENOUS | Status: AC
Start: 2018-08-24 — End: 2018-08-24
  Administered 2018-08-24: 13:00:00 via INTRAVENOUS

## 2018-08-24 MED ORDER — FUROSEMIDE 10 MG/ML IJ SOLN
10 mg/mL | Freq: Once | INTRAMUSCULAR | Status: AC
Start: 2018-08-24 — End: 2018-08-24
  Administered 2018-08-24: 08:00:00 via INTRAVENOUS

## 2018-08-24 MED ORDER — DEXTROSE 5% IN WATER (D5W) IV
3 mmol/mL | Freq: Once | INTRAVENOUS | Status: AC
Start: 2018-08-24 — End: 2018-08-24
  Administered 2018-08-24: 22:00:00 via INTRAVENOUS

## 2018-08-24 MED FILL — LEVALBUTEROL 1.25 MG/0.5 ML NEB SOLUTION: 1.25 mg/0.5 mL | RESPIRATORY_TRACT | Qty: 0.5

## 2018-08-24 MED FILL — LEVALBUTEROL 1.25 MG/3 ML SOLN FOR INHALATION: 1.25 mg/3 mL | RESPIRATORY_TRACT | Qty: 3

## 2018-08-24 MED FILL — SODIUM PHOSPHATE 3 MMOLE/ML IV: 3 mmol/mL | INTRAVENOUS | Qty: 4

## 2018-08-24 MED FILL — SODIUM PHOSPHATE 3 MMOLE/ML IV: 3 mmol/mL | INTRAVENOUS | Qty: 6

## 2018-08-24 MED FILL — MELATONIN 3 MG TAB: 3 mg | ORAL | Qty: 1

## 2018-08-24 MED FILL — FUROSEMIDE 10 MG/ML IJ SOLN: 10 mg/mL | INTRAMUSCULAR | Qty: 2

## 2018-08-24 MED FILL — ELECTROLYTE REPLACEMENT PROTOCOL: Qty: 1

## 2018-08-24 MED FILL — SODIUM CHLORIDE 0.9 % IV: INTRAVENOUS | Qty: 1000

## 2018-08-24 MED FILL — CALCIUM GLUCONATE 1 GRAM/50 ML IN SODIUM CHLORIDE, ISO-OSM IV SOLUTION: 1 gram/50 mL | INTRAVENOUS | Qty: 50

## 2018-08-24 MED FILL — DEXAMETHASONE SODIUM PHOSPHATE 4 MG/ML IJ SOLN: 4 mg/mL | INTRAMUSCULAR | Qty: 1

## 2018-08-24 MED FILL — BD POSIFLUSH NORMAL SALINE 0.9 % INJECTION SYRINGE: INTRAMUSCULAR | Qty: 10

## 2018-08-24 MED FILL — MORPHINE 2 MG/ML INJECTION: 2 mg/mL | INTRAMUSCULAR | Qty: 1

## 2018-08-24 MED FILL — POTASSIUM BICARBONATE-CITRIC ACID 25 MEQ EFFERVESCENT TAB: 25 mEq | ORAL | Qty: 1

## 2018-08-24 MED FILL — SODIUM CHLORIDE 0.9 % IV: INTRAVENOUS | Qty: 500

## 2018-08-24 MED FILL — ENOXAPARIN 40 MG/0.4 ML SUB-Q SYRINGE: 40 mg/0.4 mL | SUBCUTANEOUS | Qty: 0.4

## 2018-08-24 MED FILL — HALOPERIDOL 1 MG TAB: 1 mg | ORAL | Qty: 1

## 2018-08-24 MED FILL — ALBUTEROL SULFATE 0.083 % (0.83 MG/ML) SOLN FOR INHALATION: 2.5 mg /3 mL (0.083 %) | RESPIRATORY_TRACT | Qty: 1

## 2018-08-24 MED FILL — TYLENOL 325 MG TABLET: 325 mg | ORAL | Qty: 2

## 2018-08-24 NOTE — Other (Addendum)
Bedside and Verbal shift change report given to Dagoberto Reef, RN (oncoming nurse) by RN (offgoing nurse). Report included the following information SBAR, MAR and Cardiac Rhythm ST.

## 2018-08-24 NOTE — Progress Notes (Addendum)
PAGER ID: [phone]   MESSAGE: [phone] [address], Grose 77 yo, Pt of Dr. Nira Retort with Mets breast CA, resp 28 with severe wheezing unrelieved with scheduled nebs. O2 sat 100% room air. Please advise. Anderson Malta [phone]      0317 Discussed with Dr Dawson Bills, received orders for stat chest X-Ray, 20mg  IV Lasix x 1, PRN Xopenex and DC IV fluids.

## 2018-08-24 NOTE — Progress Notes (Signed)
Palliative Care RN Progress Note  Palliative Care Team is available Monday-Friday 8 AM-4:30 PM       NAME:  Kayla [name]   DOB:   10/16/1941   MRN:   [id number]     Date/Time Patient Seen:  08/24/2018 10:14 AM    Attending Physician: Kristen Loader, MD  Primary Care Physician: Henrene Pastor, MD        Palliative Assessment/Plan:     Code Status:  Full Code  Goals of Care: Met with the two daughters at the bedside, patients brother arriving this afternoon and they will contact me to come back to discuss code status again once he arrives.  Continuing current radiation treatments for now.   Disposition: TBD    Current Capacity for Decision Making:  Awake, but lethargic and confused, not decisional      Advance Care Planning:      Advance Directives:          Medical Power of Attorney: none          Living Will: none          POLST forms:  There is no POLST (Physician orders for life-sustaining treatment) form on file for this patient     No Advance Directive: Surrogate Decision Maker: [name] (daughter) [phone]; [name] (daughter) [phone]          The following were updated:  Discussed with Attending Physician/Provider: Kristen Loader, MD   Other Physician (consultant): reviewed chart notes   Nursing: RN  Care Manager/Discharge Planner: n/a today      Aline August RN, BSN, [facility]  Palliative Medicine  [phone] office      The Palliative Medicine team is available Monday to Friday from 8am to 4:30pm at [phone]    Interval History:       HPI:      HPI: Pt is 77 year old female with breast CA followed Dr. Brent [facility] in [place] undergoing systemic hormal therapy with faslodex. Admitted on 08/17/18 for AMS; head/abd/pelv/chest CT in ED worrisome for brain with vasogenic edemia, lung, liver mets; pt undergoing palliative WBR but unable to tolerate lying flat and then later declined to complete simulation mapping to lumbar spine fields per Dr. Callie Fielding; planning for x1 dose of faslodex  but overall poor prognosis per [facility] Dr. Rexford Maus. Pertinent comorbidities include HTN.  PTA medications per EMR: Lisinopril, fentanyl TD 81mg/hr, oxycodone IR 138mPO Q4hr PRN.  Social History:   Religious/Cultural/Spiritual: CHRISTIAN  Insurance:  Payor: [employer], prison and probation services [place] Managed Care Medicare /    ECOG Performance Status (current): 4  Prior ECOG: 4  Palliative Performance Scale (PPS): 10-20%  Prior PPS: 20-30%  FAST Scale in Dementia: n/a  PC Consulted byYP:[id number] Dr. JuNechama Guardon 9/27  for goals of care, metastatic breast cancer     Review of Systems:   Unable to obtain due to clinical circumstance      Physical Exam:   Blood pressure 123/61, pulse (!) 107, temperature 97.3 ??F (36.3 ??C), resp. rate 18, height _0  (1.676 m), weight 56.7 kg (125 lb), SpO2 100 %.  Body mass index is 20.18 kg/m??.  Wt Readings from Last 3 Encounters:   08/24/18 56.7 kg (125 lb)     Last Bowel Movement: Last Bowel Movement Date: 08/23/18(per chart)

## 2018-08-24 NOTE — Other (Addendum)
PAGER ID: [phone] Dr. Nira Retort  MESSAGE: ICU 2 Bed 11 [address]- Calcium ionized 4.3, phosphorus 1.9 this morning. Do you want to place patient on replacement protocol? Thanks, Jesel [phone]      -order received.

## 2018-08-24 NOTE — Other (Signed)
TRANSFER - OUT REPORT:    Verbal report given to Jenny Reichmann, RN (name) on [name]  being transferred to CDU (unit) for change in patient condition( )       Report consisted of patient???s Situation, Background, Assessment and   Recommendations(SBAR).     Information from the following report(s) SBAR, MAR and Cardiac Rhythm ST was reviewed with the receiving nurse.    Lines:   Peripheral IV 08/23/18 Left;Upper Cephalic (Active)   Site Assessment Clean, dry, & intact 08/24/2018  8:04 AM   Phlebitis Assessment 0 08/24/2018  8:04 AM   Infiltration Assessment 0 08/24/2018  8:04 AM   Dressing Status Clean, dry, & intact 08/24/2018  8:04 AM   Dressing Type Transparent 08/24/2018  8:04 AM   Hub Color/Line Status Patent 08/24/2018  8:04 AM   Action Taken Open ports on tubing capped 08/24/2018  8:04 AM   Alcohol Cap Used Yes 08/24/2018  8:04 AM        Opportunity for questions and clarification was provided.      Patient transported with:   [employer]

## 2018-08-24 NOTE — Other (Addendum)
TRANSFER - OUT REPORT:    Verbal report given to Caryl Pina (name) on [facility]  being transferred to ICU 2 (unit) for urgent transfer       Report consisted of patient???s Situation, Background, Assessment and   Recommendations(SBAR).     Information from the following report(s) SBAR and Kardex was reviewed with the receiving nurse.    Lines:   Peripheral IV 08/23/18 Left;Upper Cephalic (Active)   Site Assessment Clean, dry, & intact 08/23/2018  7:43 PM   Phlebitis Assessment 0 08/23/2018  7:43 PM   Infiltration Assessment 0 08/23/2018  7:43 PM   Dressing Status Clean, dry, & intact 08/23/2018  7:43 PM   Dressing Type Transparent 08/23/2018  7:43 PM   Hub Color/Line Status Pink 08/23/2018  7:43 PM   Action Taken Open ports on tubing capped 08/23/2018  7:43 PM   Alcohol Cap Used Yes 08/23/2018  7:43 PM        Opportunity for questions and clarification was provided.      Patient transported with:   Registered Nurse   Care partner  Respiratory

## 2018-08-24 NOTE — Other (Signed)
TRANSFER - IN REPORT:    Verbal report received from Clancy, RN(name) on [facility]  being received from 2 ICU(unit) for routine progression of care      Report consisted of patient???s Situation, Background, Assessment and   Recommendations(SBAR).     Information from the following report(s) SBAR, Kardex, Intake/Output, MAR, Recent Results and Cardiac Rhythm Sinus Tachycardia was reviewed with the receiving nurse.    Opportunity for questions and clarification was provided.      Assessment completed upon patient???s arrival to unit and care assumed.

## 2018-08-24 NOTE — Other (Signed)
PAGER ID: [phone]   MESSAGE: [phone], MS. Kayla [name] needs peripheral IV put in. pulled out latest one insterted by PICC. Thank you! John [phone]

## 2018-08-24 NOTE — Other (Signed)
PAGER ID: [phone]   MESSAGE: Good AM,this is Kayla [name] D.of ICU 2,this is on Kayla [name] of room 11,just want to let you know her Lactic acid is 5.0 up from 3.82.Please advise.EXT [phone]

## 2018-08-24 NOTE — Progress Notes (Signed)
[facility]  Oncology Progress Note  NAME:  Binion, Weslee  SEX:   F  DOB:   1941-08-13  DATE: 08/24/2018  REFERRING PHYSICIAN:    MR#    [id number]  LOCATION: RADIATION ONCOLOGY  BILLING#  [id number]    cc:     The patient today underwent treatment #4 of 10 planned treatments to the whole brain.  This totals 1200 cGy.  Her medical condition worsened early this morning and she was transferred to the intensive care unit.  She appears breathing comfortably now on room air.  The patient's daughters were in attendance and treatment was done without incident.      ___________________  Oliver Barre MD  Dictated By: .   AC  D:08/24/2018 14:52:51  T: 08/24/2018  [id number]

## 2018-08-24 NOTE — Progress Notes (Signed)
Circled back around and the son has not arrived yet, please notify PC if son arrives before 4:30pm.

## 2018-08-24 NOTE — Progress Notes (Signed)
Hospitalist Progress Note  Kristen Loader, MD  [facility] Hospitalists    Daily Progress Note: 08/24/2018    Assessment/Plan:     Vasogenic cerebral edema??from??brain mass??  Suspected to be metastatic breast cancer  Pt has metabolic encephalopathy from this  Pt is on steroids and a 10 fraction course of XRT.  Dr Joylene Draft is following  She looks better than on admission per the family  Prognosis is poor.  ??  Dehydration  Rehydrated.??    Dyspnea  Probably mostly metastatic dz as CxR did not show CHF/edema and her lungs are clear on exam.  Pt did respond to Lasix though so there could be some CHF contribution  ??  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   Family is taking things day by day and want to see what options are available  Per Dr Olene Floss note on 9/27, it looks like it will be just hormonal therapy and XRT.  Onc noted prognosis is poor.  Dr Rexford Maus gave Faslodex on 9/26 and the plan ois to continue injections q2 weeks??  ??  Hypercalcemia  S/p Bisphophonate.  Ca coming down and is with some hypocalcemia  ??  SIRS  Sepsis ruled out  ??  Severe protein calorie malnutrition??  Pt had been with poor PO, but family noted 9/30 that it is     PT/OT ordered  ??  Melatonin for sleep and overnight help  ??  Appreciate palliative cares help??really do??    ------------------    36 minutes were spent on the patient of which more than 50% was spent in coordination of care and counseling (time spent with patient/family face to face, physical exam, reviewing laboratory and imaging investigations, speaking with physicians and nursing staff involved in this patient's care)    ------------------  Physical exam:  General: Alert, NAD, pleasant.  Cooperative.   HEENT: Sclera anicteric, PERRL, OM moist, throat clear.  Chest:  CTA B, no wheeze, no rales, no rhonchi.  Good air movement.   CV: RRR, S1/S2 were normal, no murmur  Abdomen: NTND, soft, NABS, no masses were noted.  No rebound, no guarding.  Extremities: No edema.    Subjective:   2 daughters at bedside. We discussed XRT ongoing. We discussed ? Chemo (hormonal therapy)      Objective:     Visit Vitals  BP 124/65   Pulse (!) 111   Temp 97.3 ??F (36.3 ??C)   Resp 25   Ht 5\' 6"  (1.676 m)   Wt 56.7 kg (125 lb)   SpO2 100%   BMI 20.18 kg/m??    O2 Flow Rate (L/min): 2 l/min O2 Device: Nasal cannula    Temp (24hrs), Avg:97.5 ??F (36.4 ??C), Min:97.3 ??F (36.3 ??C), Max:97.8 ??F (36.6 ??C)    09/30 0701 - 09/30 1900  In: 150 [I.V.:150]  Out: 1600    09/28 1901 - 09/30 0700  In: 6962 [P.O.:530; I.V.:725]  Out: 1400 [Urine:1400]      Data Review:       Recent Days:  Recent Labs     08/23/18  0559   WBC 4.8   HGB 9.6*   HCT 30.1*   PLT 211     Recent Labs     08/24/18  0730 08/24/18  0514 08/23/18  0559 08/22/18  0552   NA 140  --  140 140   K 3.6  --  3.7 3.8   CL 104  --  106 106   CO2 26 25.0 24 26   GLU 185*  --  139* 122*   BUN 22  --  26* 29*   CREA 0.8  --  0.6 0.6   CA 9.1  --  9.0 9.2   PHOS 1.5*  --  1.9* 2.3*   ALB 2.7*  --  2.4* 2.5*   TBILI  --   --  2.8*  --    SGOT  --   --  740*  --    ALT  --   --  123*  --      Recent Labs     08/24/18  0514   PH 7.483*   PCO2 32.6*   PO2 70.0*   HCO3 24.5       24 Hour Results:  Recent Results (from the past 24 hour(s))   POC BLOOD GAS + LACTIC ACID    Collection Time: 08/24/18  5:14 AM   Result Value Ref Range    pH 7.483 (H) 7.350 - 7.450      PCO2 32.6 (L) 35.0 - 45.0 mm Hg    PO2 70.0 (L) 75 - 100 mm Hg    BICARBONATE 24.5 18.0 - 26.0 mmol/L    O2 SAT 95.0 90 - 100 %    CO2, TOTAL 25.0 24 - 29 mmol/L    Lactic Acid 3.82 (H) 0.40 - 2.00 mmol/L    BASE EXCESS 1 -2 - 3 mmol/L    Patient temp. 98.6      Sample type Art      SITE L Radial      DEVICE Room Air      ALLENS TEST Pass      NOTIFIED Doctor     CALCIUM, IONIZED    Collection Time: 08/24/18  7:30 AM   Result Value Ref Range     CALCIUM,IONIZED 4.3 (L) 4.4 - 5.4 mg/dl   RENAL FUNCTION PANEL    Collection Time: 08/24/18  7:30 AM   Result Value Ref Range    Sodium 140 136 - 145 mEq/L    Potassium 3.6 3.5 - 5.1 mEq/L    Chloride 104 98 - 107 mEq/L    CO2 26 21 - 32 mEq/L    Glucose 185 (H) 74 - 106 mg/dl    BUN 22 7 - 25 mg/dl    Creatinine 0.8 0.6 - 1.3 mg/dl    GFR est AA >60.0      GFR est non-AA >60      Calcium 9.1 8.5 - 10.1 mg/dl    Albumin 2.7 (L) 3.4 - 5.0 gm/dl    Phosphorus 1.5 (L) 2.5 - 4.9 mg/dl    Anion gap 10 5 - 15 mmol/L   LACTIC ACID    Collection Time: 08/24/18 10:33 AM   Result Value Ref Range    Lactic Acid 5.0 (HH) 0.4 - 2.0 mmol/L       Xr Chest Sngl V    Result Date: 08/24/2018  INDICATION: Wheezing, SOB   EXAMINATION: XR CHEST SNGL V COMPARISON: 08/17/2018 FINDINGS: Mild cardiomegaly.. Small bilateral pleural effusions. Multiple focal lung lesions bilaterally. Diffuse osteolytic bony changes..        IMPRESSION: 1. Mild cardiomegaly. 2. Small bilateral pleural effusions. 3. Multiple pulmonary nodules consistent with metastases. 4. Diffuse osteolytic lesions consistent with bony metastases.     Xr  Chest Sngl V    Result Date: 08/17/2018  Indication: Short of breath Frontal view chest Heart mildly enlarged. Shallow inspiration with interstitial and patchy alveolar densities throughout the lungs. Anterior cervical fusion at 3 adjacent lower cervical levels. Degenerative narrowing and sclerosis bilateral humeral joint. This rotated to the right.     IMPRESSION: Edema versus multifocal pneumonia or chronic changes bilaterally. Cardiomegaly. Postsurgical changes cervical spine.     Ct Head Wo Cont    Result Date: 08/17/2018  DICOM format image data is available to nonaffiliated external healthcare facilities or entities on a secure, media free, reciprocally searchable basis with patient authorization for 12 months following the date of the study. Indication: Altered mental status Technique:  5 millimeter axial  images without contrast were obtained through the brain. Comparison: none Findings:   There is generalized moderate cerebral atrophy.  Low attenuation surrounds the lateral ventricles. There is a rounded 16 x 14 mm mass in the right thalamus surrounding low-density edema. The mass is hyperdense peripherally and low density centrally. There is also focal low-density in the right parietal white matter suggesting vasogenic edema. Focal low-density seen more laterally in the right parietal cortex measuring 2.2 cm diameter. Apparent 2.6 cm x 2.2 cm mass is seen in the right cerebellum with surrounding low-density suggesting vasogenic edema. Asymmetric 11 mm low-density irregular lytic lesion is seen in the diploic space of the right parietal bone. No hemorrhage seen. Sella, pineal, craniocervical junction, orbits, paranasal sinuses, and temporal bones are unremarkable.      Impression: cerebral atrophy and nonspecific white matter changes not unusual for age; multiple lesions as described in brain and calvarium worrisome for metastatic disease on this noncontrast study. Contrast MRI suggested for further evaluation.     Cta Chest W Or W Wo Cont    Result Date: 08/17/2018  DICOM format image data is available to nonaffiliated external healthcare facilities or entities on a secure, media free, reciprocally searchable basis with patient authorization for 12 months following the date of the study. Indication: Altered mental status Technique: 3 millimeter axial images with IV contrast obtained from lung apices to bases. 3-D MIP CTA images obtained. Comparison: Frontal view chest 08/17/2018 Findings:  Mediastinum: No aortic dissection or pulmonary embolus. Calcified aortic arch and mild coronary calcifications. 10 mm low-density focus right thyroid lobe. Mild cardiomegaly. Small bilateral pleural effusions. Lungs: Numerous nodules of varying sizes throughout the lungs largest of which  left lower lobe measures 18 x 19 mm. Focal pleural thickening adjacent to rib destruction right lower lobe posterolaterally. Bones/ chest wall: Extensive lytic lesions throughout the skeletal system of the thorax including sternum, spine, and ribs. High-grade compression fractures T3 and T4 with mild retropulsion. Anterior cervical fusion. Multiple masses are seen throughout the right breast the largest of which is partially imaged laterally at 6.8 x 3.5 cm; there is overlying right breast skin thickening. Asymmetric upper normal size nodes are seen in the right axilla. Below the diaphragm liver is heterogeneously low in density.     Impression: 1. No evidence of pulmonary embolus or aortic dissection. 2. 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 3. Indeterminate lesion right thyroid lobe which could be evaluated with ultrasound. 4. Cardiomegaly. 5. Bilateral small pleural effusions. 6. Multiple lesions throughout lungs and bones consistent with metastatic disease possibly from breast primary with asymmetric multi centric lesions throughout the right breast and overlying skin thickening with asymmetric upper normal size right axillary nodes. 7. Compression fractures T3 and T4  with retropulsion. 8. Heterogeneous liver. Metastatic disease not excluded.     Ct Abd Pelv W Cont    Result Date: 08/17/2018  DICOM format image data is available to nonaffiliated external healthcare facilities or entities on a secure, media free, reciprocally searchable basis with patient authorization for 12 months following the date of the study. TECHNIQUE: CT of the abdomen and pelvis WITH intravenous contrast.  The abdomen and pelvis were scanned utilizing a multidetector helical scanner from the diaphragm to the lesser trochanter without the oral administration of barium.  Coronal and sagittal reformations were obtained. COMPARISON: none INDICATION: Altered mental status DISCUSSION:  HEPATOBILIARY: Multiple low-density ill-defined lesions throughout the liver with mild hepatomegaly. Gallbladder mildly thick-walled but otherwise unremarkable. SPLEEN: No splenomegaly or focal lesion. PANCREAS: Atrophic. ADRENALS: No adrenal nodules. KIDNEYS/URETERS: 17 mm rounded low-density right lower renal pole cystic cyst without hydronephrosis. PELVIC ORGANS/BLADDER: Uterus atrophic or removed. Extensive artifact in pelvis due to right hip replacement. PERITONEUM / RETROPERITONEUM: No free air or fluid. LYMPH NODES: No lymphadenopathy. VESSELS: Heavy calcification in nondilated aorta and iliac vessels. GI TRACT: No distention or wall thickening. No evidence of appendicitis. Large amount retained fecal material in colon and rectum. BONES AND SOFT TISSUES: Extensive lytic destruction of the bony structures throughout spine and pelvis with left hip replacement. Multiple compression fractures of vertebral bodies moderate grade at L3 and low-grade at L2 and L4 without significant retropulsion. Small fracture right superior acetabular margin laterally.     IMPRESSION: 1. 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 Compression fractures L2 through L4. Small acute fracture suspected at superior lateral right acetabular margin. 2. Indeterminate right renal lesion likely represent cyst which could be confirmed with ultrasound. 3. Left hip replacement. 4. Uterus atrophic or removed. 5. Atherosclerotic vascular disease. 6. Constipation.       Microbiology:  All Micro Results     None           Cardiology:  Results for orders placed or performed during the hospital encounter of 08/17/18   EKG, 12 LEAD, INITIAL   Result Value Ref Range    Ventricular Rate 120 BPM    Atrial Rate 120 BPM    P-R Interval 150 ms    QRS Duration 84 ms    Q-T Interval 384 ms    QTC Calculation (Bezet) 542 ms    Calculated P Axis 73 degrees    Calculated R Axis 0 degrees    Calculated T Axis -97 degrees    Diagnosis         Poor data quality, interpretation may be adversely affected  Sinus tachycardia  Septal infarct , age undetermined  T wave abnormality, consider inferior ischemia  T wave abnormality, consider anterolateral ischemia  Prolonged QT  When compared with ECG of 17-Aug-2018 20:03,  Septal infarct is now present  T wave inversion now evident in Inferior leads  T wave inversion now evident in Anterolateral leads  Confirmed by Marrion Coy, M.D., Ellin Saba. ([id number]) on 08/21/2018 11:43:51 AM           Problem List:  Problem List as of 08/24/2018 Never Reviewed          Codes Class Noted - Resolved    Lactic acidosis ICD-10-CM: E87.2  ICD-9-CM: 276.2  08/17/2018 - Present        Altered mental status ICD-10-CM: R41.82  ICD-9-CM: 780.97  08/17/2018 - Present        Metastasis from breast cancer ([place]) ICD-10-CM: C79.9, C50.919  ICD-9-CM: 199.1, 174.9  08/17/2018 - Present              Medications reviewed  Current Facility-Administered Medications   Medication Dose Route Frequency   ??? levalbuterol (XOPENEX) nebulizer soln 1.25 mg/3 mL  1.25 mg Nebulization Q4H PRN   ??? POTASSIUM ELECTROLYTE REPLACEMENT PROTOCOL STANDARD DOSING  1 Each Other PRN   ??? MAGNESIUM ELECTROLYTE REPLACEMENT PROTOCOL STANDARD DOSING  1 Each Other PRN   ??? PHOSPHATE ELECTROLYTE REPLACEMENT PROTOCOL STANDARD DOSING  1 Each Other PRN   ??? sodium chloride 0.9 % bolus infusion 500 mL  500 mL IntraVENous ONCE   ??? 0.9% sodium chloride infusion  75 mL/hr IntraVENous CONTINUOUS   ??? melatonin tablet 3 mg  3 mg Oral QHS   ??? haloperidol (HALDOL) tablet 1 mg  1 mg Oral QHS PRN   ??? morphine injection 1-2 mg  1-2 mg IntraVENous Q3H PRN   ??? sodium chloride (NS) flush 5-10 mL  5-10 mL IntraVENous Q8H   ??? sodium chloride (NS) flush 5-10 mL  5-10 mL IntraVENous PRN   ??? naloxone (NARCAN) injection 0.1 mg  0.1 mg IntraVENous PRN   ??? acetaminophen (TYLENOL) tablet 650 mg  650 mg Oral Q6H PRN   ??? enoxaparin (LOVENOX) injection 40 mg  40 mg SubCUTAneous Q24H    ??? influenza vaccine 2019-20 (4 yrs+)(PF) (FLUCELVAX QUAD) injection 0.5 mL  0.5 mL IntraMUSCular PRIOR TO DISCHARGE   ??? dexamethasone (DECADRON) 4 mg/mL injection 4 mg  4 mg IntraVENous Q6H   ??? potassium bicarbonate (KLYTE) tablet 25 mEq  25 mEq Oral BID         Kristen Loader, MD

## 2018-08-24 NOTE — Progress Notes (Signed)
Medicine Progress Note    Patient: Kayla [name] Age: 77 y.o. Sex: female    Date of Birth: 19-Jul-1941 Admit Date: 08/17/2018 PCP: Henrene Pastor, MD   MRN: [id number]  CSN: [id number]         Assessment/Plan   Acute hypoxic respiratory failure   Acute respiratory distress  Vasogenic cerebral edema??from??brain mass??  Metastatic??breast??cancer  Hypercalcemia??coming down  SIRS  Severe protein calorie malnutrition??    Additional Plan notes   ABG with low Po2  Start high flow  Lasix IV   Holding IVF   DuoNebs  Patient is on steroids for cerebral edema   Trend WBC and fever curve   Transfer to stepdown  CXR pending     Subjective:   Informed by nurse of patient with tachypnea, tachycardia and extensive wheezing present throughout the day. Patient seen and examined. unreliable historian, history obtained from daughter at bedside, answered all questions.     Objective:     Visit Vitals  BP 132/74 (BP 1 Location: Right arm, BP Patient Position: Supine)   Pulse (!) 114   Temp 97.6 ??F (36.4 ??C)   Resp 28   Ht 5\' 6"  (1.676 m)   Wt 57.5 kg (126 lb 12.2 oz)   SpO2 96%   BMI 20.46 kg/m??       Physical Exam  General appearance: alert, cooperative, no distress, appears stated age  Head: Normocephalic, without obvious abnormality, atraumatic  Neck: supple, trachea midline  Lungs: diminished at bases, rales up to mid-lung level   Heart: regular rate and rhythm, S1, S2 normal, no murmur, click, rub or gallop  Abdomen: soft, non-tender. Bowel sounds normal. No masses,  no organomegaly  Extremities: extremities normal, atraumatic, no cyanosis, 2+ leg edema   Skin: Skin color, texture, turgor normal. No rashes or lesions  Neurologic: Grossly normal    Intake and Output:  Current Shift:  No intake/output data recorded.  Last three shifts:  09/28 0701 - 09/29 1900  In: 2047.5 [P.O.:890; I.V.:1157.5]  Out: 1000 [Urine:1000]    Lab/Data Reviewed:  Lab:   Recent Results (from the past 12 hour(s))   POC BLOOD GAS + LACTIC ACID     Collection Time: 08/24/18  5:14 AM   Result Value Ref Range    pH 7.483 (H) 7.350 - 7.450      PCO2 32.6 (L) 35.0 - 45.0 mm Hg    PO2 70.0 (L) 75 - 100 mm Hg    BICARBONATE 24.5 18.0 - 26.0 mmol/L    O2 SAT 95.0 90 - 100 %    CO2, TOTAL 25.0 24 - 29 mmol/L    Lactic Acid 3.82 (H) 0.40 - 2.00 mmol/L    BASE EXCESS 1 -2 - 3 mmol/L    Patient temp. 98.6      Sample type Art      SITE L Radial      DEVICE Room Air      ALLENS TEST Pass      NOTIFIED Doctor         Imaging:    No results found.    Medications Reviewed:  Current Facility-Administered Medications   Medication Dose Route Frequency   ??? melatonin tablet 3 mg  3 mg Oral QHS   ??? levalbuterol (XOPENEX) nebulizer soln 1.25 mg/3 mL  1.25 mg Nebulization Q4H RT   ??? sodium chloride (NS) flush 5-10 mL  5-10 mL IntraVENous Q8H   ??? enoxaparin (LOVENOX) injection 40 mg  40  mg SubCUTAneous Q24H   ??? influenza vaccine 2019-20 (4 yrs+)(PF) (FLUCELVAX QUAD) injection 0.5 mL  0.5 mL IntraMUSCular PRIOR TO DISCHARGE   ??? dexamethasone (DECADRON) 4 mg/mL injection 4 mg  4 mg IntraVENous Q6H   ??? potassium bicarbonate (KLYTE) tablet 25 mEq  25 mEq Oral BID       Critical care time 40 minutes   Rosalene Billings, MD  August 24, 2018

## 2018-08-24 NOTE — Other (Signed)
Bedside and Verbal shift change report given to [place], RN (oncoming nurse) by Jenny Reichmann, RN (offgoing nurse). Report included the following information SBAR, Kardex, Procedure Summary, MAR, Recent Results and Cardiac Rhythm Sinus Tachycardia.

## 2018-08-24 NOTE — Progress Notes (Signed)
Went in room to assess patient,  She was on room air. Bs= CBl / diminished.  Sats were 99%.  Pt took off HFNC, states she doesn't want it on.  She also didn't want any breathing treatments after the 7 am xopenex.  Made them PRN

## 2018-08-24 NOTE — Other (Addendum)
Pt's wheezing has greatly improved, was incontinent of a very large amount of urine in her diaper.  Pt is currently resting comfortably.  Respiratory therapist spoke to MD and is drawing ABGs at this time.    [id number]: Received call from Dr. Dawson Bills, orders received to transfer pt to stepdown, administer high flow O2, and repeat lactic acid at 1000.  Dr. Dawson Bills currently at bedside discussing with daughter.

## 2018-08-24 NOTE — Progress Notes (Signed)
[facility]  Oncology Progress Note  NAME:  [name], Kayla  SEX:   F  DOB:   1941/06/27  DATE: 08/24/2018  REFERRING PHYSICIAN:    MR#    [id number]  LOCATION: RADIATION ONCOLOGY  BILLING#  [id number]    cc:    The patient today underwent treatment #4 of 10 planned treatments to the whole brain.  This totals 1200 cGy.  Her medical condition worsened early this morning and she was transferred to the intensive care unit.  She appears breathing comfortably now on room air.  The patient's daughters were in attendance and treatment was done without incident.      ___________________  Oliver Barre MD  Dictated By: .   AC  D:08/24/2018 14:52:51  T: 08/24/2018  [id number]

## 2018-08-24 NOTE — Progress Notes (Signed)
PAGER ID: [phone]   MESSAGE: [phone] Kayla [name], Kayla [name] 77 yo, Pt of Dr. Nira Retort with Mets breast CA, resp 28 with severe wheezing unrelieved with scheduled nebs. O2 sat 100% room air. Please advise. Anderson [place] [phone]      0317 Discussed with Dr Dawson Bills, received orders for stat chest X-Ray, 20mg  IV Lasix x 1, PRN Xopenex and DC IV fluids.

## 2018-08-24 NOTE — Progress Notes (Signed)
 Palliative Care RN Progress Note  Palliative Care Team is available Monday-Friday 8 AM-4:30 PM       NAME:  Kayla [name]   DOB:   02/27/1941   MRN:   [id number]     Date/Time Patient Seen:  08/24/2018 10:14 AM    Attending Physician: Lorriane Oliva BRAVO, MD  Primary Care Physician: Dany Ozell LABOR, MD        Palliative Assessment/Plan:     Code Status:  Full Code  Goals of Care: Met with the two daughters at the bedside, patients brother arriving this afternoon and they will contact me to come back to discuss code status again once he arrives.  Continuing current radiation treatments for now.   Disposition: TBD    Current Capacity for Decision Making:  Awake, but lethargic and confused, not decisional      Advance Care Planning:      Advance Directives:          Medical Power of Attorney: none          Living Will: none          POLST forms:  There is no POLST (Physician orders for life-sustaining treatment) form on file for this patient     No Advance Directive: Surrogate Decision Maker: [name] (daughter) [phone]; [name] (daughter) [phone]          The following were updated:  Discussed with Attending Physician/Provider: Lorriane Oliva BRAVO, MD   Other Physician (consultant): reviewed chart notes   Nursing: RN  Care Manager/Discharge Planner: n/a today      Ronnald Brain RN, BSN, [facility]  Palliative Medicine  [phone] office      The Palliative Medicine team is available Monday to Friday from 8am to 4:30pm at [phone]    Interval History:       HPI:      HPI: Pt is 77 year old female with breast CA followed Dr. Danso in [place] undergoing systemic hormal therapy with faslodex. Admitted on 08/17/18 for AMS; head/abd/pelv/chest CT in ED worrisome for brain with vasogenic edemia, lung, liver mets; pt undergoing palliative WBR but unable to tolerate lying flat and then later declined to complete simulation mapping to lumbar spine fields per Dr. Marge; planning for x1 dose of faslodex but overall poor  prognosis per [facility] Dr. Linnette. Pertinent comorbidities include HTN.  PTA medications per EMR: Lisinopril, fentanyl TD 50mcg/hr, oxycodone IR 10mg  PO Q4hr PRN.  Social History:   Religious/Cultural/Spiritual: CHRISTIAN  Insurance:  Payor: [employer] / Plan: [facility] [employer] MEDICARE / Product Type: Managed Care Medicare /    ECOG Performance Status (current): 4  Prior ECOG: 4  Palliative Performance Scale (PPS): 10-20%  Prior PPS: 20-30%  FAST Scale in Dementia: n/a  PC Consulted ab:Rnwdlouzi by Dr. Juanna Pinal  on 9/27  for goals of care, metastatic breast cancer     Review of Systems:   Unable to obtain due to clinical circumstance      Physical Exam:   Blood pressure 123/61, pulse (!) 107, temperature 97.3 F (36.3 C), resp. rate 18, height 5' 6 (1.676 m), weight 56.7 kg (125 lb), SpO2 100 %.  Body mass index is 20.18 kg/m.  Wt Readings from Last 3 Encounters:   08/24/18 56.7 kg (125 lb)     Last Bowel Movement: Last Bowel Movement Date: 08/23/18(per chart)

## 2018-08-24 NOTE — Progress Notes (Signed)
Hospitalist Progress Note  Kristen Loader, MD  [facility] Hospitalists    Daily Progress Note: 08/24/2018    Assessment/Plan:     Vasogenic cerebral edema??from??brain mass??  Suspected to be metastatic breast cancer  Pt has metabolic encephalopathy from this  Pt is on steroids and a 10 fraction course of XRT.  Dr Joylene Draft is following  She looks better than on admission per the family  Prognosis is poor.  ??  Dehydration  Rehydrated.??    Dyspnea  Probably mostly metastatic dz as CxR did not show CHF/edema and her lungs are clear on exam.  Pt did respond to Lasix though so there could be some CHF contribution  ??  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   Family is taking things day by day and want to see what options are available  Per Dr Olene Floss note on 9/27, it looks like it will be just hormonal therapy and XRT.  Onc noted prognosis is poor.  Dr Rexford Maus gave Faslodex on 9/26 and the plan ois to continue injections q2 weeks??  ??  Hypercalcemia  S/p Bisphophonate.  Ca coming down and is with some hypocalcemia  ??  SIRS  Sepsis ruled out  ??  Severe protein calorie malnutrition??  Pt had been with poor PO, but family noted 9/30 that it is     PT/OT ordered  ??  Melatonin for sleep and overnight help  ??  Appreciate palliative cares help??really do??    ------------------    36 minutes were spent on the patient of which more than 50% was spent in coordination of care and counseling (time spent with patient/family face to face, physical exam, reviewing laboratory and imaging investigations, speaking with physicians and nursing staff involved in this patient's care)    ------------------  Physical exam:  General: Alert, NAD, pleasant.  Cooperative.   HEENT: Sclera anicteric, PERRL, OM moist, throat clear.  Chest:  CTA B, no wheeze, no rales, no rhonchi.  Good air movement.  CV:  RRR, S1/S2 were normal, no murmur  Abdomen: NTND, soft, NABS, no masses were noted.  No rebound, no guarding.  Extremities: No edema.    Subjective:   2 daughters at bedside. We discussed XRT ongoing. We discussed ? Chemo (hormonal therapy)      Objective:     Visit Vitals  BP 124/65   Pulse (!) 111   Temp 97.3 ??F (36.3 ??C)   Resp 25   Ht 5\' 6"  (1.676 m)   Wt 56.7 kg (125 lb)   SpO2 100%   BMI 20.18 kg/m??    O2 Flow Rate (L/min): 2 l/min O2 Device: Nasal cannula    Temp (24hrs), Avg:97.5 ??F (36.4 ??C), Min:97.3 ??F (36.3 ??C), Max:97.8 ??F (36.6 ??C)    09/30 0701 - 09/30 1900  In: 150 [I.V.:150]  Out: 1600    09/28 1901 - 09/30 0700  In: 1610 [P.O.:530; I.V.:725]  Out: 1400 [Urine:1400]      Data Review:       Recent Days:  Recent Labs     08/23/18  0559   WBC 4.8   HGB 9.6*   HCT 30.1*   PLT 211     Recent Labs     08/24/18  0730 08/24/18  0514 08/23/18  0559 08/22/18  0552   NA 140  --  140 140   K 3.6  --  3.7 3.8   CL 104  --  106 106   CO2 26 25.0 24 26   GLU 185*  --  139* 122*   BUN 22  --  26* 29*   CREA 0.8  --  0.6 0.6   CA 9.1  --  9.0 9.2   PHOS 1.5*  --  1.9* 2.3*   ALB 2.7*  --  2.4* 2.5*   TBILI  --   --  2.8*  --    SGOT  --   --  740*  --    ALT  --   --  123*  --      Recent Labs     08/24/18  0514   PH 7.483*   PCO2 32.6*   PO2 70.0*   HCO3 24.5       24 Hour Results:  Recent Results (from the past 24 hour(s))   POC BLOOD GAS + LACTIC ACID    Collection Time: 08/24/18  5:14 AM   Result Value Ref Range    pH 7.483 (H) 7.350 - 7.450      PCO2 32.6 (L) 35.0 - 45.0 mm Hg    PO2 70.0 (L) 75 - 100 mm Hg    BICARBONATE 24.5 18.0 - 26.0 mmol/L    O2 SAT 95.0 90 - 100 %    CO2, TOTAL 25.0 24 - 29 mmol/L    Lactic Acid 3.82 (H) 0.40 - 2.00 mmol/L    BASE EXCESS 1 -2 - 3 mmol/L    Patient temp. 98.6      Sample type Art      SITE L Radial      DEVICE Room Air      ALLENS TEST Pass      NOTIFIED Doctor     CALCIUM, IONIZED    Collection Time: 08/24/18  7:30 AM   Result Value Ref Range    CALCIUM,IONIZED 4.3 (L)  4.4 - 5.4 mg/dl   RENAL FUNCTION PANEL    Collection Time: 08/24/18  7:30 AM   Result Value Ref Range    Sodium 140 136 - 145 mEq/L    Potassium 3.6 3.5 - 5.1 mEq/L    Chloride 104 98 - 107 mEq/L    CO2 26 21 - 32 mEq/L    Glucose 185 (H) 74 - 106 mg/dl    BUN 22 7 - 25 mg/dl    Creatinine 0.8 0.6 - 1.3 mg/dl    GFR est AA >60.0      GFR est non-AA >60      Calcium 9.1 8.5 - 10.1 mg/dl    Albumin 2.7 (L) 3.4 - 5.0 gm/dl    Phosphorus 1.5 (L) 2.5 - 4.9 mg/dl    Anion gap 10 5 - 15 mmol/L   LACTIC ACID    Collection Time: 08/24/18 10:33 AM   Result Value Ref Range    Lactic Acid 5.0 (HH) 0.4 - 2.0 mmol/L       Xr Chest Sngl V    Result Date: 08/24/2018  INDICATION: Wheezing, SOB   EXAMINATION: XR CHEST SNGL V COMPARISON: 08/17/2018 FINDINGS: Mild cardiomegaly.. Small bilateral pleural effusions. Multiple focal lung lesions bilaterally. Diffuse osteolytic bony changes..        IMPRESSION: 1. Mild cardiomegaly. 2. Small bilateral pleural effusions. 3. Multiple pulmonary nodules consistent with metastases. 4. Diffuse osteolytic lesions consistent with bony metastases.     Xr  Chest Sngl V    Result Date: 08/17/2018  Indication: Short of breath Frontal view chest Heart mildly enlarged. Shallow inspiration with interstitial and patchy alveolar densities throughout the lungs. Anterior cervical fusion at 3 adjacent lower cervical levels. Degenerative narrowing and sclerosis bilateral humeral joint. This rotated to the right.     IMPRESSION: Edema versus multifocal pneumonia or chronic changes bilaterally. Cardiomegaly. Postsurgical changes cervical spine.     Ct Head Wo Cont    Result Date: 08/17/2018  DICOM format image data is available to nonaffiliated external healthcare facilities or entities on a secure, media free, reciprocally searchable basis with patient authorization for 12 months following the date of the study. Indication: Altered mental status Technique:  5 millimeter axial images without contrast were obtained  through the brain. Comparison: none Findings:   There is generalized moderate cerebral atrophy.  Low attenuation surrounds the lateral ventricles. There is a rounded 16 x 14 mm mass in the right thalamus surrounding low-density edema. The mass is hyperdense peripherally and low density centrally. There is also focal low-density in the right parietal white matter suggesting vasogenic edema. Focal low-density seen more laterally in the right parietal cortex measuring 2.2 cm diameter. Apparent 2.6 cm x 2.2 cm mass is seen in the right cerebellum with surrounding low-density suggesting vasogenic edema. Asymmetric 11 mm low-density irregular lytic lesion is seen in the diploic space of the right parietal bone. No hemorrhage seen. Sella, pineal, craniocervical junction, orbits, paranasal sinuses, and temporal bones are unremarkable.      Impression: cerebral atrophy and nonspecific white matter changes not unusual for age; multiple lesions as described in brain and calvarium worrisome for metastatic disease on this noncontrast study. Contrast MRI suggested for further evaluation.     Cta Chest W Or W Wo Cont    Result Date: 08/17/2018  DICOM format image data is available to nonaffiliated external healthcare facilities or entities on a secure, media free, reciprocally searchable basis with patient authorization for 12 months following the date of the study. Indication: Altered mental status Technique: 3 millimeter axial images with IV contrast obtained from lung apices to bases. 3-D MIP CTA images obtained. Comparison: Frontal view chest 08/17/2018 Findings:  Mediastinum: No aortic dissection or pulmonary embolus. Calcified aortic arch and mild coronary calcifications. 10 mm low-density focus right thyroid lobe. Mild cardiomegaly. Small bilateral pleural effusions. Lungs: Numerous nodules of varying sizes throughout the lungs largest of which left lower lobe measures 18 x 19 mm. Focal pleural thickening adjacent to rib  destruction right lower lobe posterolaterally. Bones/ chest wall: Extensive lytic lesions throughout the skeletal system of the thorax including sternum, spine, and ribs. High-grade compression fractures T3 and T4 with mild retropulsion. Anterior cervical fusion. Multiple masses are seen throughout the right breast the largest of which is partially imaged laterally at 6.8 x 3.5 cm; there is overlying right breast skin thickening. Asymmetric upper normal size nodes are seen in the right axilla. Below the diaphragm liver is heterogeneously low in density.     Impression: 1. No evidence of pulmonary embolus or aortic dissection. 2. 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 3. Indeterminate lesion right thyroid lobe which could be evaluated with ultrasound. 4. Cardiomegaly. 5. Bilateral small pleural effusions. 6. Multiple lesions throughout lungs and bones consistent with metastatic disease possibly from breast primary with asymmetric multi centric lesions throughout the right breast and overlying skin thickening with asymmetric upper normal size right axillary nodes. 7. Compression fractures T3 and T4  with retropulsion. 8. Heterogeneous liver. Metastatic disease not excluded.     Ct Abd Pelv W Cont    Result Date: 08/17/2018  DICOM format image data is available to nonaffiliated external healthcare facilities or entities on a secure, media free, reciprocally searchable basis with patient authorization for 12 months following the date of the study. TECHNIQUE: CT of the abdomen and pelvis WITH intravenous contrast.  The abdomen and pelvis were scanned utilizing a multidetector helical scanner from the diaphragm to the lesser trochanter without the oral administration of barium.  Coronal and sagittal reformations were obtained. COMPARISON: none INDICATION: Altered mental status DISCUSSION: HEPATOBILIARY: Multiple low-density ill-defined lesions throughout the liver with mild hepatomegaly. Gallbladder  mildly thick-walled but otherwise unremarkable. SPLEEN: No splenomegaly or focal lesion. PANCREAS: Atrophic. ADRENALS: No adrenal nodules. KIDNEYS/URETERS: 17 mm rounded low-density right lower renal pole cystic cyst without hydronephrosis. PELVIC ORGANS/BLADDER: Uterus atrophic or removed. Extensive artifact in pelvis due to right hip replacement. PERITONEUM / RETROPERITONEUM: No free air or fluid. LYMPH NODES: No lymphadenopathy. VESSELS: Heavy calcification in nondilated aorta and iliac vessels. GI TRACT: No distention or wall thickening. No evidence of appendicitis. Large amount retained fecal material in colon and rectum. BONES AND SOFT TISSUES: Extensive lytic destruction of the bony structures throughout spine and pelvis with left hip replacement. Multiple compression fractures of vertebral bodies moderate grade at L3 and low-grade at L2 and L4 without significant retropulsion. Small fracture right superior acetabular margin laterally.     IMPRESSION: 1. 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 Compression fractures L2 through L4. Small acute fracture suspected at superior lateral right acetabular margin. 2. Indeterminate right renal lesion likely represent cyst which could be confirmed with ultrasound. 3. Left hip replacement. 4. Uterus atrophic or removed. 5. Atherosclerotic vascular disease. 6. Constipation.       Microbiology:  All Micro Results     None           Cardiology:  Results for orders placed or performed during the hospital encounter of 08/17/18   EKG, 12 LEAD, INITIAL   Result Value Ref Range    Ventricular Rate 120 BPM    Atrial Rate 120 BPM    P-R Interval 150 ms    QRS Duration 84 ms    Q-T Interval 384 ms    QTC Calculation (Bezet) 542 ms    Calculated P Axis 73 degrees    Calculated R Axis 0 degrees    Calculated T Axis -97 degrees    Diagnosis        Poor data quality, interpretation may be adversely affected  Sinus tachycardia  Septal infarct , age undetermined  T wave  abnormality, consider inferior ischemia  T wave abnormality, consider anterolateral ischemia  Prolonged QT  When compared with ECG of 17-Aug-2018 20:03,  Septal infarct is now present  T wave inversion now evident in Inferior leads  T wave inversion now evident in Anterolateral leads  Confirmed by Marrion Coy, M.D., Ellin Saba. ([id number]) on 08/21/2018 11:43:51 AM           Problem List:  Problem List as of 08/24/2018 Never Reviewed          Codes Class Noted - Resolved    Lactic acidosis ICD-10-CM: E87.2  ICD-9-CM: 276.2  08/17/2018 - Present        Altered mental status ICD-10-CM: R41.82  ICD-9-CM: 780.97  08/17/2018 - Present        Metastasis from breast cancer ([place]) ICD-10-CM: C79.9, C50.919  ICD-9-CM: 199.1, 174.9  08/17/2018 - Present              Medications reviewed  Current Facility-Administered Medications   Medication Dose Route Frequency   ??? levalbuterol (XOPENEX) nebulizer soln 1.25 mg/3 mL  1.25 mg Nebulization Q4H PRN   ??? POTASSIUM ELECTROLYTE REPLACEMENT PROTOCOL STANDARD DOSING  1 Each Other PRN   ??? MAGNESIUM ELECTROLYTE REPLACEMENT PROTOCOL STANDARD DOSING  1 Each Other PRN   ??? PHOSPHATE ELECTROLYTE REPLACEMENT PROTOCOL STANDARD DOSING  1 Each Other PRN   ??? sodium chloride 0.9 % bolus infusion 500 mL  500 mL IntraVENous ONCE   ??? 0.9% sodium chloride infusion  75 mL/hr IntraVENous CONTINUOUS   ??? melatonin tablet 3 mg  3 mg Oral QHS   ??? haloperidol (HALDOL) tablet 1 mg  1 mg Oral QHS PRN   ??? morphine injection 1-2 mg  1-2 mg IntraVENous Q3H PRN   ??? sodium chloride (NS) flush 5-10 mL  5-10 mL IntraVENous Q8H   ??? sodium chloride (NS) flush 5-10 mL  5-10 mL IntraVENous PRN   ??? naloxone (NARCAN) injection 0.1 mg  0.1 mg IntraVENous PRN   ??? acetaminophen (TYLENOL) tablet 650 mg  650 mg Oral Q6H PRN   ??? enoxaparin (LOVENOX) injection 40 mg  40 mg SubCUTAneous Q24H   ??? influenza vaccine 2019-20 (4 yrs+)(PF) (FLUCELVAX QUAD) injection 0.5 mL  0.5 mL IntraMUSCular PRIOR TO DISCHARGE   ??? dexamethasone (DECADRON) 4 mg/mL  injection 4 mg  4 mg IntraVENous Q6H   ??? potassium bicarbonate (KLYTE) tablet 25 mEq  25 mEq Oral BID         Kristen Loader, MD

## 2018-08-24 NOTE — Progress Notes (Signed)
Medicine Progress Note    Patient: Kayla [name] Age: 77 y.o. Sex: female    Date of Birth: 12-25-1940 Admit Date: 08/17/2018 PCP: Henrene Pastor, MD   MRN: [id number]  CSN: [id number]         Assessment/Plan   Acute hypoxic respiratory failure   Acute respiratory distress  Vasogenic cerebral edema??from??brain mass??  Metastatic??breast??cancer  Hypercalcemia??coming down  SIRS  Severe protein calorie malnutrition??    Additional Plan notes   ABG with low Po2  Start high flow  Lasix IV   Holding IVF   DuoNebs  Patient is on steroids for cerebral edema   Trend WBC and fever curve   Transfer to stepdown  CXR pending     Subjective:   Informed by nurse of patient with tachypnea, tachycardia and extensive wheezing present throughout the day. Patient seen and examined. unreliable historian, history obtained from daughter at bedside, answered all questions.     Objective:     Visit Vitals  BP 132/74 (BP 1 Location: Right arm, BP Patient Position: Supine)   Pulse (!) 114   Temp 97.6 ??F (36.4 ??C)   Resp 28   Ht 5\' 6"  (1.676 m)   Wt 57.5 kg (126 lb 12.2 oz)   SpO2 96%   BMI 20.46 kg/m??       Physical Exam  General appearance: alert, cooperative, no distress, appears stated age  Head: Normocephalic, without obvious abnormality, atraumatic  Neck: supple, trachea midline  Lungs: diminished at bases, rales up to mid-lung level   Heart: regular rate and rhythm, S1, S2 normal, no murmur, click, rub or gallop  Abdomen: soft, non-tender. Bowel sounds normal. No masses,  no organomegaly  Extremities: extremities normal, atraumatic, no cyanosis, 2+ leg edema   Skin: Skin color, texture, turgor normal. No rashes or lesions  Neurologic: Grossly normal    Intake and Output:  Current Shift:  No intake/output data recorded.  Last three shifts:  09/28 0701 - 09/29 1900  In: 2047.5 [P.O.:890; I.V.:1157.5]  Out: 1000 [Urine:1000]    Lab/Data Reviewed:  Lab:   Recent Results (from the past 12 hour(s))   POC BLOOD GAS + LACTIC ACID    Collection  Time: 08/24/18  5:14 AM   Result Value Ref Range    pH 7.483 (H) 7.350 - 7.450      PCO2 32.6 (L) 35.0 - 45.0 mm Hg    PO2 70.0 (L) 75 - 100 mm Hg    BICARBONATE 24.5 18.0 - 26.0 mmol/L    O2 SAT 95.0 90 - 100 %    CO2, TOTAL 25.0 24 - 29 mmol/L    Lactic Acid 3.82 (H) 0.40 - 2.00 mmol/L    BASE EXCESS 1 -2 - 3 mmol/L    Patient temp. 98.6      Sample type Art      SITE L Radial      DEVICE Room Air      ALLENS TEST Pass      NOTIFIED Doctor         Imaging:    No results found.    Medications Reviewed:  Current Facility-Administered Medications   Medication Dose Route Frequency   ??? melatonin tablet 3 mg  3 mg Oral QHS   ??? levalbuterol (XOPENEX) nebulizer soln 1.25 mg/3 mL  1.25 mg Nebulization Q4H RT   ??? sodium chloride (NS) flush 5-10 mL  5-10 mL IntraVENous Q8H   ??? enoxaparin (LOVENOX) injection 40 mg  40  mg SubCUTAneous Q24H   ??? influenza vaccine 2019-20 (4 yrs+)(PF) (FLUCELVAX QUAD) injection 0.5 mL  0.5 mL IntraMUSCular PRIOR TO DISCHARGE   ??? dexamethasone (DECADRON) 4 mg/mL injection 4 mg  4 mg IntraVENous Q6H   ??? potassium bicarbonate (KLYTE) tablet 25 mEq  25 mEq Oral BID       Critical care time 40 minutes   Rosalene Billings, MD  August 24, 2018

## 2018-08-25 LAB — RENAL FUNCTION PANEL
Albumin: 2.5 gm/dl — ABNORMAL LOW (ref 3.4–5.0)
Albumin: 2.5 gm/dl — ABNORMAL LOW (ref 3.4–5.0)
Anion Gap: 6 mmol/L (ref 5–15)
Anion gap: 6 mmol/L (ref 5–15)
BUN: 26 mg/dl — ABNORMAL HIGH (ref 7–25)
BUN: 26 mg/dl — ABNORMAL HIGH (ref 7–25)
CO2: 30 mEq/L (ref 21–32)
CO2: 30 mEq/L (ref 21–32)
Calcium: 8.4 mg/dl — ABNORMAL LOW (ref 8.5–10.1)
Calcium: 8.4 mg/dl — ABNORMAL LOW (ref 8.5–10.1)
Chloride: 104 mEq/L (ref 98–107)
Chloride: 104 mEq/L (ref 98–107)
Creatinine: 0.7 mg/dl (ref 0.6–1.3)
Creatinine: 0.7 mg/dl (ref 0.6–1.3)
EGFR IF NonAfrican American: >60
GFR African American: >60
GFR est AA: >60
GFR est non-AA: >60
Glucose: 119 mg/dl — ABNORMAL HIGH (ref 74–106)
Glucose: 119 mg/dl — ABNORMAL HIGH (ref 74–106)
Phosphorus: 3 mg/dl (ref 2.5–4.9)
Phosphorus: 3 mg/dl (ref 2.5–4.9)
Potassium: 4.3 mEq/L (ref 3.5–5.1)
Potassium: 4.3 mEq/L (ref 3.5–5.1)
Sodium: 140 mEq/L (ref 136–145)
Sodium: 140 mEq/L (ref 136–145)

## 2018-08-25 LAB — MAGNESIUM
Magnesium: 2 mg/dl (ref 1.6–2.6)
Magnesium: 2 mg/dl (ref 1.6–2.6)

## 2018-08-25 LAB — CALCIUM, IONIZED
CALCIUM,IONIZED: 4 mg/dl — ABNORMAL LOW (ref 4.4–5.4)
CALCIUM,IONIZED: 4 mg/dl — ABNORMAL LOW (ref 4.4–5.4)

## 2018-08-25 MED ORDER — NYSTATIN 100,000 UNIT/ML ORAL SUSP
100000 unit/mL | Freq: Four times a day (QID) | ORAL | Status: DC
Start: 2018-08-25 — End: 2018-09-03
  Administered 2018-08-25 – 2018-09-03 (×30): via ORAL

## 2018-08-25 MED ORDER — CALCIUM GLUCONATE 2 GRAM/100 ML IN SODIUM CHLORIDE (ISO-OSM) IVPB
2 gram/100 mL | Freq: Once | INTRAVENOUS | Status: AC
Start: 2018-08-25 — End: 2018-08-25
  Administered 2018-08-25: 19:00:00 via INTRAVENOUS

## 2018-08-25 MED ORDER — POTASSIUM CHLORIDE SR 10 MEQ TAB
10 mEq | Freq: Every day | ORAL | Status: DC
Start: 2018-08-25 — End: 2018-09-03
  Administered 2018-08-26 – 2018-09-03 (×9): via ORAL

## 2018-08-25 MED ORDER — FUROSEMIDE 20 MG TAB
20 mg | Freq: Every day | ORAL | Status: DC
Start: 2018-08-25 — End: 2018-09-03
  Administered 2018-08-25 – 2018-09-03 (×10): via ORAL

## 2018-08-25 MED ORDER — IBUPROFEN 400 MG TAB
400 mg | Freq: Four times a day (QID) | ORAL | Status: DC | PRN
Start: 2018-08-25 — End: 2018-09-03
  Administered 2018-08-25 – 2018-08-30 (×2): via ORAL

## 2018-08-25 MED ORDER — IBUPROFEN 600 MG TAB
600 mg | Freq: Once | ORAL | Status: AC
Start: 2018-08-25 — End: 2018-08-24
  Administered 2018-08-25: via ORAL

## 2018-08-25 MED FILL — LEVALBUTEROL 1.25 MG/3 ML SOLN FOR INHALATION: 1.25 mg/3 mL | RESPIRATORY_TRACT | Qty: 3

## 2018-08-25 MED FILL — NYSTATIN 100,000 UNIT/ML ORAL SUSP: 100000 unit/mL | ORAL | Qty: 5

## 2018-08-25 MED FILL — CALCIUM GLUCONATE 2 GRAM/100 ML IN SODIUM CHLORIDE (ISO-OSM) IVPB: 2 gram/100 mL | INTRAVENOUS | Qty: 100

## 2018-08-25 MED FILL — HALOPERIDOL 0.5 MG TAB: 0.5 mg | ORAL | Qty: 2

## 2018-08-25 MED FILL — MELATONIN 3 MG TAB: 3 mg | ORAL | Qty: 1

## 2018-08-25 MED FILL — FUROSEMIDE 20 MG TAB: 20 mg | ORAL | Qty: 1

## 2018-08-25 MED FILL — DEXAMETHASONE SODIUM PHOSPHATE 4 MG/ML IJ SOLN: 4 mg/mL | INTRAMUSCULAR | Qty: 1

## 2018-08-25 MED FILL — POTASSIUM BICARBONATE-CITRIC ACID 25 MEQ EFFERVESCENT TAB: 25 mEq | ORAL | Qty: 1

## 2018-08-25 MED FILL — IBUPROFEN 400 MG TAB: 400 mg | ORAL | Qty: 1

## 2018-08-25 MED FILL — BD POSIFLUSH NORMAL SALINE 0.9 % INJECTION SYRINGE: INTRAMUSCULAR | Qty: 10

## 2018-08-25 MED FILL — IBUPROFEN 600 MG TAB: 600 mg | ORAL | Qty: 1

## 2018-08-25 NOTE — Other (Signed)
Bedside and Verbal shift change report given to Maria Theresa Fortus Ongoco, RN   (oncoming nurse) by Edith, rn (offgoing nurse). Report included the following information SBAR and Kardex.

## 2018-08-25 NOTE — Progress Notes (Signed)
Palliative following, family waiting to speak with Dr Brent General about treatment options.  CM will continue to be available.

## 2018-08-25 NOTE — Progress Notes (Signed)
[facility]  Oncology Progress Note  NAME:  Kayla [name], Kayla [name]  SEX:   F  DOB:   03/16/1941  DATE: 08/25/2018  REFERRING PHYSICIAN:    MR#    [id number]  LOCATION: RADIATION ONCOLOGY  BILLING#  [id number]    cc:     The patient today underwent treatment #5 of 10 planned whole brain treatments.  She remains an inpatient for medical management.  The situation was again discussed with the patient's family.  Port films were checked for accuracy and treatment is to continue tomorrow as planned.      ___________________  Oliver Barre MD  Dictated By: .   [facility]  D:08/25/2018 14:54:21  T: 08/25/2018  [id number]

## 2018-08-25 NOTE — Other (Signed)
Bedside and Verbal shift change report given to Sheliah Mends, RN (oncoming nurse) by Daisy Lazar, RN  (offgoing nurse). Report included the following information SBAR, Kardex, Intake/Output, MAR, Recent Results and Cardiac Rhythm NSR.

## 2018-08-25 NOTE — Progress Notes (Signed)
Called by RN for patient needing PRN HHN TX.  Upon arrival noting that patient had mild increase work of breathing from frequent coughing with productive sputum that is frothy white and thick.  02 sat reading 95%, HR 105, RR 24.  Post tx not much change with frequent productive coughing.  Made RN aware and asked for oral suction to be setup in room.  Will continue to monitor for remainder of shift.

## 2018-08-25 NOTE — Progress Notes (Signed)
Problem: Falls - Risk of  Goal: *Absence of Falls  Description  Document Patrcia Dolly Fall Risk and appropriate interventions in the flowsheet.  Outcome: Progressing Towards Goal  Note:   Fall Risk Interventions:  Mobility Interventions: Bed/chair exit alarm, Communicate number of staff needed for ambulation/transfer, Mechanical lift, OT consult for ADLs, Patient to call before getting OOB, PT Consult for mobility concerns, PT Consult for assist device competence    Mentation Interventions: Adequate sleep, hydration, pain control, Bed/chair exit alarm, Door open when patient unattended, Eyeglasses and hearing aids, Familiar objects from home, Family/sitter at bedside, Increase mobility, More frequent rounding, Reorient patient, Room close to nurse's station, Update white board    Medication Interventions: Bed/chair exit alarm, Evaluate medications/consider consulting pharmacy, Patient to call before getting OOB    Elimination Interventions: Bed/chair exit alarm, Call light in reach, Patient to call for help with toileting needs, Toileting schedule/hourly rounds    History of Falls Interventions: Bed/chair exit alarm, Consult care management for discharge planning, Investigate reason for fall, Room close to nurse's station         Problem: Patient Education: Go to Patient Education Activity  Goal: Patient/Family Education  Outcome: Progressing Towards Goal

## 2018-08-25 NOTE — Progress Notes (Addendum)
Palliative Care RN Progress Note  Palliative Care Team is available Monday-Friday 8 AM-4:30 PM       NAME:  Kayla [name]   DOB:   Apr 04, 1941   MRN:   [id number]     Date/Time Patient Seen:  08/25/2018 10:14 AM    Attending Physician: Kristen Loader, MD  Primary Care Physician: Henrene Pastor, MD        Palliative Assessment/Plan:     Code Status:  Full Code  Goals of Care: Patients son is now in town and met with him and daughter Juliann Pulse at the bedside.  They have requested to speak with oncologist Dr. Brent General to better understand the current treatment options and overall prognosis to help make future medical decisions for their mom.  Coordinated with Dr. Manya Silvas office to please call the son Cherie Ouch directly to discuss and answer questions.  They are considering changing the patients code status but want to wait until have spoken with Dr. Brent General.  Also coordinated with Dr. Nira Retort to speak with the son while he is in town today.   Disposition: TBD    Current Capacity for Decision Making:  Awake, but lethargic and confused, not decisional      Advance Care Planning:      Advance Directives:          Medical Power of Attorney: none          Living Will: none          POLST forms:  There is no POLST (Physician orders for life-sustaining treatment) form on file for this patient     No Advance Directive: Surrogate Decision Maker: [name] (daughter) [phone]; [name] (daughter) [phone]          The following were updated:  Discussed with Attending Physician/Provider: Kristen Loader, MD   Other Physician (consultant): reviewed chart notes   Nursing: RN  Care Manager/Discharge Planner: n/a today      Aline August RN, BSN, [facility]  Palliative Medicine  [phone] office      The Palliative Medicine team is available Monday to Friday from 8am to 4:30pm at [phone]    Interval History:       HPI:      HPI: Pt is 77 year old female with breast CA followed Dr. Brent General in [place]  undergoing systemic hormal therapy with faslodex. Admitted on 08/17/18 for AMS; head/abd/pelv/chest CT in ED worrisome for brain with vasogenic edemia, lung, liver mets; pt undergoing palliative WBR but unable to tolerate lying flat and then later declined to complete simulation mapping to lumbar spine fields per Dr. Callie Fielding; planning for x1 dose of faslodex but overall poor prognosis per [facility] Dr. Rexford Maus. Pertinent comorbidities include HTN.  PTA medications per EMR: Lisinopril, fentanyl TD 85mg/hr, oxycodone IR 149mPO Q4hr PRN.  Social History:   Religious/Cultural/Spiritual: CHRISTIAN  Insurance:  Payor: [employer], prison and probation services [place] /    ECOG Performance Status (current): 4  Prior ECOG: 4  Palliative Performance Scale (PPS): 10-20%  Prior PPS: 20-30%  FAST Scale in Dementia: n/a  PC Consulted byHK:[email or web address] Dr. JuNechama Guardon 9/27  for goals of care, metastatic breast cancer     Review of Systems:   Unable to obtain due to clinical circumstance      Physical Exam:   Blood pressure 124/81, pulse 99, temperature 97.8 ??F (36.6 ??C), resp. rate 22, height 5' 6" (1.676 m), weight  56.7 kg (125 lb), SpO2 100 %.  Body mass index is 20.18 kg/m??.  Wt Readings from Last 3 Encounters:   08/24/18 56.7 kg (125 lb)     Last Bowel Movement: Last Bowel Movement Date: 08/25/18

## 2018-08-25 NOTE — Progress Notes (Signed)
Hospitalist Progress Note  Kristen Loader, MD  [facility] Hospitalists    Daily Progress Note: 08/25/2018    Assessment/Plan:     Vasogenic cerebral edema??from??brain mass??  Suspected to be metastatic breast cancer  Pt has metabolic encephalopathy from this  Pt is on steroids and a 10 fraction course of XRT (ending 10/8)  Dr Joylene Draft is following  She looks better than on admission per the family  Prognosis is poor.  ??  Dehydration  Rehydrated.??    Dyspnea  Probably mostly metastatic dz as CxR did not show CHF/edema and her lungs are clear on exam.  Pt did respond to Lasix though so there could be some CHF contribution  Added Lasix 20mg  daily (10/1)  ??  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   Family is taking things day by day and want to see what options are available  Per Dr Olene Floss note on 9/27, it looks like it will be just hormonal therapy and XRT.  Onc noted prognosis is poor.  Dr Rexford Maus gave Faslodex on 9/26 and the plan is to continue injections q2 weeks??  I spoke with Dr. Aldona Bar of oncology (10/1) who noted that the patient's performance status is poor and until she can get better, and the patient can go to outpatient follow-up with Dr. Brent General, he noted that the patient would not be eligible for any sort of chemotherapy or immunotherapy at this time.    He noted that the patient is only able to tolerate hormonal therapy  ??  Hypercalcemia  S/p Bisphophonate.  Ca coming down and is with some hypocalcemia  ??  SIRS  Sepsis ruled out  ??  Severe protein calorie malnutrition??  Pt had been with poor PO, but family noted 9/30 that it is     Generalized weakness  PT/OT re-ordered  ??  Insomnia  Melatonin for sleep  ??  Code status. DNR    Disposition  Family aware pt is doing poorly, but would like to see if pt can bounce  back and get strong enough to get Chemo or Immunotherapy  Perhaps SNF initially?    ------------------    36 minutes were spent on the patient of which more than 50% was spent in coordination of care and counseling (time spent with patient/family face to face, physical exam, reviewing laboratory and imaging investigations, speaking with physicians and nursing staff involved in this patient's care)    ------------------  Physical exam:  General: Alert, NAD, pleasant.  Cooperative.   HEENT: Sclera anicteric, PERRL, OM moist, throat clear.  Chest:  CTA B, no wheeze, no rales, no rhonchi.  Good air movement.  CV: RRR, S1/S2 were normal, no murmur  Abdomen: NTND, soft, NABS, no masses were noted.  No rebound, no guarding.  Extremities: No edema.    Subjective:   3 daughters and a son at bedside. We discussed XRT ongoing.  We discussed the patient's current performance status which is quite poor at this time.  I would know that I spoke with Dr. Edd Arbour of oncology who noted that the patient's performance status is what is limiting options for the patient.  As she would not be able to tolerate additional chemo or immunotherapy in her current condition and that at this time hormonal therapy is only treatment she has.  We discussed continuing the  radiation therapy for now and then but that we would try to get PT OT to work with the patient to get her stronger so she will be able to make it to the outpatient oncology office.  Also discussed CODE STATUS which is to be DNR      Objective:     Visit Vitals  BP 124/81   Pulse 99   Temp 97.8 ??F (36.6 ??C)   Resp 22   Ht 5\' 6"  (1.676 m)   Wt 56.7 kg (125 lb)   SpO2 100%   BMI 20.18 kg/m??    O2 Flow Rate (L/min): 2 l/min O2 Device: Nasal cannula    Temp (24hrs), Avg:97.3 ??F (36.3 ??C), Min:97 ??F (36.1 ??C), Max:97.8 ??F (36.6 ??C)    No intake/output data recorded.   09/29 1901 - 10/01 0700  In: 200 [P.O.:50; I.V.:150]  Out: 2900 [Urine:1300]      Data Review:       Recent Days:   Recent Labs     08/23/18  0559   WBC 4.8   HGB 9.6*   HCT 30.1*   PLT 211     Recent Labs     08/25/18  0844 08/24/18  1516 08/24/18  0730 08/24/18  0514 08/23/18  0559   NA 140  --  140  --  140   K 4.3  --  3.6  --  3.7   CL 104  --  104  --  106   CO2 30  --  26 25.0 24   GLU 119*  --  185*  --  139*   BUN 26*  --  22  --  26*   CREA 0.7  --  0.8  --  0.6   CA 8.4*  --  9.1  --  9.0   MG 2.0  --   --   --   --    PHOS 3.0 1.8* 1.5*  --  1.9*   ALB 2.5*  --  2.7*  --  2.4*   TBILI  --   --   --   --  2.8*   SGOT  --   --   --   --  740*   ALT  --   --   --   --  123*     Recent Labs     08/24/18  0514   PH 7.483*   PCO2 32.6*   PO2 70.0*   HCO3 24.5       24 Hour Results:  Recent Results (from the past 24 hour(s))   PHOSPHORUS    Collection Time: 08/24/18  3:16 PM   Result Value Ref Range    Phosphorus 1.8 (L) 2.5 - 4.9 mg/dl   CALCIUM, IONIZED    Collection Time: 08/24/18  3:16 PM   Result Value Ref Range    CALCIUM,IONIZED 4.5 4.4 - 5.4 mg/dl   CALCIUM, IONIZED    Collection Time: 08/25/18  8:44 AM   Result Value Ref Range    CALCIUM,IONIZED 4.0 (L) 4.4 - 5.4 mg/dl   RENAL FUNCTION PANEL    Collection Time: 08/25/18  8:44 AM   Result Value Ref Range    Sodium 140 136 - 145 mEq/L    Potassium 4.3 3.5 - 5.1 mEq/L    Chloride 104 98 - 107 mEq/L    CO2 30 21 - 32 mEq/L    Glucose 119 (H) 74 - 106 mg/dl  BUN 26 (H) 7 - 25 mg/dl    Creatinine 0.7 0.6 - 1.3 mg/dl    GFR est AA >60.0      GFR est non-AA >60      Calcium 8.4 (L) 8.5 - 10.1 mg/dl    Albumin 2.5 (L) 3.4 - 5.0 gm/dl    Phosphorus 3.0 2.5 - 4.9 mg/dl    Anion gap 6 5 - 15 mmol/L   MAGNESIUM    Collection Time: 08/25/18  8:44 AM   Result Value Ref Range    Magnesium 2.0 1.6 - 2.6 mg/dl       Xr Chest Sngl V    Result Date: 08/24/2018  INDICATION: Wheezing, SOB   EXAMINATION: XR CHEST SNGL V COMPARISON: 08/17/2018 FINDINGS: Mild cardiomegaly.. Small bilateral pleural effusions. Multiple focal lung lesions bilaterally. Diffuse osteolytic bony changes..         IMPRESSION: 1. Mild cardiomegaly. 2. Small bilateral pleural effusions. 3. Multiple pulmonary nodules consistent with metastases. 4. Diffuse osteolytic lesions consistent with bony metastases.     Xr Chest Sngl V    Result Date: 08/17/2018  Indication: Short of breath Frontal view chest Heart mildly enlarged. Shallow inspiration with interstitial and patchy alveolar densities throughout the lungs. Anterior cervical fusion at 3 adjacent lower cervical levels. Degenerative narrowing and sclerosis bilateral humeral joint. This rotated to the right.     IMPRESSION: Edema versus multifocal pneumonia or chronic changes bilaterally. Cardiomegaly. Postsurgical changes cervical spine.     Ct Head Wo Cont    Result Date: 08/17/2018  DICOM format image data is available to nonaffiliated external healthcare facilities or entities on a secure, media free, reciprocally searchable basis with patient authorization for 12 months following the date of the study. Indication: Altered mental status Technique:  5 millimeter axial images without contrast were obtained through the brain. Comparison: none Findings:   There is generalized moderate cerebral atrophy.  Low attenuation surrounds the lateral ventricles. There is a rounded 16 x 14 mm mass in the right thalamus surrounding low-density edema. The mass is hyperdense peripherally and low density centrally. There is also focal low-density in the right parietal white matter suggesting vasogenic edema. Focal low-density seen more laterally in the right parietal cortex measuring 2.2 cm diameter. Apparent 2.6 cm x 2.2 cm mass is seen in the right cerebellum with surrounding low-density suggesting vasogenic edema. Asymmetric 11 mm low-density irregular lytic lesion is seen in the diploic space of the right parietal bone. No hemorrhage seen. Sella, pineal, craniocervical junction, orbits, paranasal sinuses, and temporal bones are unremarkable.       Impression: cerebral atrophy and nonspecific white matter changes not unusual for age; multiple lesions as described in brain and calvarium worrisome for metastatic disease on this noncontrast study. Contrast MRI suggested for further evaluation.     Cta Chest W Or W Wo Cont    Result Date: 08/17/2018  DICOM format image data is available to nonaffiliated external healthcare facilities or entities on a secure, media free, reciprocally searchable basis with patient authorization for 12 months following the date of the study. Indication: Altered mental status Technique: 3 millimeter axial images with IV contrast obtained from lung apices to bases. 3-D MIP CTA images obtained. Comparison: Frontal view chest 08/17/2018 Findings:  Mediastinum: No aortic dissection or pulmonary embolus. Calcified aortic arch and mild coronary calcifications. 10 mm low-density focus right thyroid lobe. Mild cardiomegaly. Small bilateral pleural effusions. Lungs: Numerous nodules of varying sizes throughout the lungs largest of which  left lower lobe measures 18 x 19 mm. Focal pleural thickening adjacent to rib destruction right lower lobe posterolaterally. Bones/ chest wall: Extensive lytic lesions throughout the skeletal system of the thorax including sternum, spine, and ribs. High-grade compression fractures T3 and T4 with mild retropulsion. Anterior cervical fusion. Multiple masses are seen throughout the right breast the largest of which is partially imaged laterally at 6.8 x 3.5 cm; there is overlying right breast skin thickening. Asymmetric upper normal size nodes are seen in the right axilla. Below the diaphragm liver is heterogeneously low in density.     Impression: 1. No evidence of pulmonary embolus or aortic dissection. 2. 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 3. Indeterminate lesion right thyroid lobe which could be evaluated with ultrasound. 4. Cardiomegaly. 5. Bilateral small pleural effusions. 6.  Multiple lesions throughout lungs and bones consistent with metastatic disease possibly from breast primary with asymmetric multi centric lesions throughout the right breast and overlying skin thickening with asymmetric upper normal size right axillary nodes. 7. Compression fractures T3 and T4 with retropulsion. 8. Heterogeneous liver. Metastatic disease not excluded.     Ct Abd Pelv W Cont    Result Date: 08/17/2018  DICOM format image data is available to nonaffiliated external healthcare facilities or entities on a secure, media free, reciprocally searchable basis with patient authorization for 12 months following the date of the study. TECHNIQUE: CT of the abdomen and pelvis WITH intravenous contrast.  The abdomen and pelvis were scanned utilizing a multidetector helical scanner from the diaphragm to the lesser trochanter without the oral administration of barium.  Coronal and sagittal reformations were obtained. COMPARISON: none INDICATION: Altered mental status DISCUSSION: HEPATOBILIARY: Multiple low-density ill-defined lesions throughout the liver with mild hepatomegaly. Gallbladder mildly thick-walled but otherwise unremarkable. SPLEEN: No splenomegaly or focal lesion. PANCREAS: Atrophic. ADRENALS: No adrenal nodules. KIDNEYS/URETERS: 17 mm rounded low-density right lower renal pole cystic cyst without hydronephrosis. PELVIC ORGANS/BLADDER: Uterus atrophic or removed. Extensive artifact in pelvis due to right hip replacement. PERITONEUM / RETROPERITONEUM: No free air or fluid. LYMPH NODES: No lymphadenopathy. VESSELS: Heavy calcification in nondilated aorta and iliac vessels. GI TRACT: No distention or wall thickening. No evidence of appendicitis. Large amount retained fecal material in colon and rectum. BONES AND SOFT TISSUES: Extensive lytic destruction of the bony structures throughout spine and pelvis with left hip replacement. Multiple compression fractures of  vertebral bodies moderate grade at L3 and low-grade at L2 and L4 without significant retropulsion. Small fracture right superior acetabular margin laterally.     IMPRESSION: 1. 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 Compression fractures L2 through L4. Small acute fracture suspected at superior lateral right acetabular margin. 2. Indeterminate right renal lesion likely represent cyst which could be confirmed with ultrasound. 3. Left hip replacement. 4. Uterus atrophic or removed. 5. Atherosclerotic vascular disease. 6. Constipation.       Microbiology:  All Micro Results     None           Cardiology:  Results for orders placed or performed during the hospital encounter of 08/17/18   EKG, 12 LEAD, INITIAL   Result Value Ref Range    Ventricular Rate 120 BPM    Atrial Rate 120 BPM    P-R Interval 150 ms    QRS Duration 84 ms    Q-T Interval 384 ms    QTC Calculation (Bezet) 542 ms    Calculated P Axis 73 degrees    Calculated R Axis  0 degrees    Calculated T Axis -97 degrees    Diagnosis        Poor data quality, interpretation may be adversely affected  Sinus tachycardia  Septal infarct , age undetermined  T wave abnormality, consider inferior ischemia  T wave abnormality, consider anterolateral ischemia  Prolonged QT  When compared with ECG of 17-Aug-2018 20:03,  Septal infarct is now present  T wave inversion now evident in Inferior leads  T wave inversion now evident in Anterolateral leads  Confirmed by Marrion Coy, M.D., Ellin Saba. ([id number]) on 08/21/2018 11:43:51 AM           Problem List:  Problem List as of 08/25/2018 Never Reviewed          Codes Class Noted - Resolved    Lactic acidosis ICD-10-CM: E87.2  ICD-9-CM: 276.2  08/17/2018 - Present        Altered mental status ICD-10-CM: R41.82  ICD-9-CM: 780.97  08/17/2018 - Present        Metastasis from breast cancer [facility]) ICD-10-CM: C79.9, C50.919  ICD-9-CM: 199.1, 174.9  08/17/2018 - Present              Medications reviewed   Current Facility-Administered Medications   Medication Dose Route Frequency   ??? levalbuterol (XOPENEX) nebulizer soln 1.25 mg/3 mL  1.25 mg Nebulization Q4H PRN   ??? melatonin tablet 3 mg  3 mg Oral QHS   ??? haloperidol (HALDOL) tablet 1 mg  1 mg Oral QHS PRN   ??? sodium chloride (NS) flush 5-10 mL  5-10 mL IntraVENous Q8H   ??? sodium chloride (NS) flush 5-10 mL  5-10 mL IntraVENous PRN   ??? naloxone (NARCAN) injection 0.1 mg  0.1 mg IntraVENous PRN   ??? acetaminophen (TYLENOL) tablet 650 mg  650 mg Oral Q6H PRN   ??? enoxaparin (LOVENOX) injection 40 mg  40 mg SubCUTAneous Q24H   ??? influenza vaccine 2019-20 (4 yrs+)(PF) (FLUCELVAX QUAD) injection 0.5 mL  0.5 mL IntraMUSCular PRIOR TO DISCHARGE   ??? dexamethasone (DECADRON) 4 mg/mL injection 4 mg  4 mg IntraVENous Q6H         Kristen Loader, MD

## 2018-08-25 NOTE — Progress Notes (Signed)
Hospitalist Progress Note  Kristen Loader, MD  [facility]    Daily Progress Note: 08/25/2018    Assessment/Plan:     Vasogenic cerebral edema??from??brain mass??  Suspected to be metastatic breast cancer  Pt has metabolic encephalopathy from this  Pt is on steroids and a 10 fraction course of XRT (ending 10/8)  Dr Joylene Draft is following  She looks better than on admission per the family  Prognosis is poor.  ??  Dehydration  Rehydrated.??    Dyspnea  Probably mostly metastatic dz as CxR did not show CHF/edema and her lungs are clear on exam.  Pt did respond to Lasix though so there could be some CHF contribution  Added Lasix 20mg  daily (10/1)  ??  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   Family is taking things day by day and want to see what options are available  Per Dr Olene Floss note on 9/27, it looks like it will be just hormonal therapy and XRT.  Onc noted prognosis is poor.  Dr Rexford Maus gave Faslodex on 9/26 and the plan is to continue injections q2 weeks??  I spoke with Dr. Aldona Bar of oncology (10/1) who noted that the patient's performance status is poor and until she can get better, and the patient can go to outpatient follow-up with Dr. Brent General, he noted that the patient would not be eligible for any sort of chemotherapy or immunotherapy at this time.    He noted that the patient is only able to tolerate hormonal therapy  ??  Hypercalcemia  S/p Bisphophonate.  Ca coming down and is with some hypocalcemia  ??  SIRS  Sepsis ruled out  ??  Severe protein calorie malnutrition??  Pt had been with poor PO, but family noted 9/30 that it is     Generalized weakness  PT/OT re-ordered  ??  Insomnia  Melatonin for sleep  ??  Code status. DNR    Disposition  Family aware pt is doing poorly, but would like to see if pt can bounce back and get strong enough to get Chemo  or Immunotherapy  Perhaps SNF initially?    ------------------    36 minutes were spent on the patient of which more than 50% was spent in coordination of care and counseling (time spent with patient/family face to face, physical exam, reviewing laboratory and imaging investigations, speaking with physicians and nursing staff involved in this patient's care)    ------------------  Physical exam:  General: Alert, NAD, pleasant.  Cooperative.   HEENT: Sclera anicteric, PERRL, OM moist, throat clear.  Chest:  CTA B, no wheeze, no rales, no rhonchi.  Good air movement.  CV: RRR, S1/S2 were normal, no murmur  Abdomen: NTND, soft, NABS, no masses were noted.  No rebound, no guarding.  Extremities: No edema.    Subjective:   3 daughters and a son at bedside. We discussed XRT ongoing.  We discussed the patient's current performance status which is quite poor at this time.  I would know that I spoke with Dr. Edd Arbour of oncology who noted that the patient's performance status is what is limiting options for the patient.  As she would not be able to tolerate additional chemo or immunotherapy in her current condition and that at this time hormonal therapy is only treatment she has.  We discussed continuing the  radiation therapy for now and then but that we would try to get PT OT to work with the patient to get her stronger so she will be able to make it to the outpatient oncology office.  Also discussed CODE STATUS which is to be DNR      Objective:     Visit Vitals  BP 124/81   Pulse 99   Temp 97.8 ??F (36.6 ??C)   Resp 22   Ht 5\' 6"  (1.676 m)   Wt 56.7 kg (125 lb)   SpO2 100%   BMI 20.18 kg/m??    O2 Flow Rate (L/min): 2 l/min O2 Device: Nasal cannula    Temp (24hrs), Avg:97.3 ??F (36.3 ??C), Min:97 ??F (36.1 ??C), Max:97.8 ??F (36.6 ??C)    No intake/output data recorded.   09/29 1901 - 10/01 0700  In: 200 [P.O.:50; I.V.:150]  Out: 2900 [Urine:1300]      Data Review:       Recent Days:  Recent Labs     08/23/18  0559   WBC 4.8   HGB 9.6*    HCT 30.1*   PLT 211     Recent Labs     08/25/18  0844 08/24/18  1516 08/24/18  0730 08/24/18  0514 08/23/18  0559   NA 140  --  140  --  140   K 4.3  --  3.6  --  3.7   CL 104  --  104  --  106   CO2 30  --  26 25.0 24   GLU 119*  --  185*  --  139*   BUN 26*  --  22  --  26*   CREA 0.7  --  0.8  --  0.6   CA 8.4*  --  9.1  --  9.0   MG 2.0  --   --   --   --    PHOS 3.0 1.8* 1.5*  --  1.9*   ALB 2.5*  --  2.7*  --  2.4*   TBILI  --   --   --   --  2.8*   SGOT  --   --   --   --  740*   ALT  --   --   --   --  123*     Recent Labs     08/24/18  0514   PH 7.483*   PCO2 32.6*   PO2 70.0*   HCO3 24.5       24 Hour Results:  Recent Results (from the past 24 hour(s))   PHOSPHORUS    Collection Time: 08/24/18  3:16 PM   Result Value Ref Range    Phosphorus 1.8 (L) 2.5 - 4.9 mg/dl   CALCIUM, IONIZED    Collection Time: 08/24/18  3:16 PM   Result Value Ref Range    CALCIUM,IONIZED 4.5 4.4 - 5.4 mg/dl   CALCIUM, IONIZED    Collection Time: 08/25/18  8:44 AM   Result Value Ref Range    CALCIUM,IONIZED 4.0 (L) 4.4 - 5.4 mg/dl   RENAL FUNCTION PANEL    Collection Time: 08/25/18  8:44 AM   Result Value Ref Range    Sodium 140 136 - 145 mEq/L    Potassium 4.3 3.5 - 5.1 mEq/L    Chloride 104 98 - 107 mEq/L    CO2 30 21 - 32 mEq/L    Glucose 119 (H) 74 - 106 mg/dl  BUN 26 (H) 7 - 25 mg/dl    Creatinine 0.7 0.6 - 1.3 mg/dl    GFR est AA >60.0      GFR est non-AA >60      Calcium 8.4 (L) 8.5 - 10.1 mg/dl    Albumin 2.5 (L) 3.4 - 5.0 gm/dl    Phosphorus 3.0 2.5 - 4.9 mg/dl    Anion gap 6 5 - 15 mmol/L   MAGNESIUM    Collection Time: 08/25/18  8:44 AM   Result Value Ref Range    Magnesium 2.0 1.6 - 2.6 mg/dl       Xr Chest Sngl V    Result Date: 08/24/2018  INDICATION: Wheezing, SOB   EXAMINATION: XR CHEST SNGL V COMPARISON: 08/17/2018 FINDINGS: Mild cardiomegaly.. Small bilateral pleural effusions. Multiple focal lung lesions bilaterally. Diffuse osteolytic bony changes..        IMPRESSION: 1. Mild cardiomegaly. 2. Small bilateral  pleural effusions. 3. Multiple pulmonary nodules consistent with metastases. 4. Diffuse osteolytic lesions consistent with bony metastases.     Xr Chest Sngl V    Result Date: 08/17/2018  Indication: Short of breath Frontal view chest Heart mildly enlarged. Shallow inspiration with interstitial and patchy alveolar densities throughout the lungs. Anterior cervical fusion at 3 adjacent lower cervical levels. Degenerative narrowing and sclerosis bilateral humeral joint. This rotated to the right.     IMPRESSION: Edema versus multifocal pneumonia or chronic changes bilaterally. Cardiomegaly. Postsurgical changes cervical spine.     Ct Head Wo Cont    Result Date: 08/17/2018  DICOM format image data is available to nonaffiliated external healthcare facilities or entities on a secure, media free, reciprocally searchable basis with patient authorization for 12 months following the date of the study. Indication: Altered mental status Technique:  5 millimeter axial images without contrast were obtained through the brain. Comparison: none Findings:   There is generalized moderate cerebral atrophy.  Low attenuation surrounds the lateral ventricles. There is a rounded 16 x 14 mm mass in the right thalamus surrounding low-density edema. The mass is hyperdense peripherally and low density centrally. There is also focal low-density in the right parietal white matter suggesting vasogenic edema. Focal low-density seen more laterally in the right parietal cortex measuring 2.2 cm diameter. Apparent 2.6 cm x 2.2 cm mass is seen in the right cerebellum with surrounding low-density suggesting vasogenic edema. Asymmetric 11 mm low-density irregular lytic lesion is seen in the diploic space of the right parietal bone. No hemorrhage seen. Sella, pineal, craniocervical junction, orbits, paranasal sinuses, and temporal bones are unremarkable.      Impression: cerebral atrophy and nonspecific white matter changes not unusual for age; multiple  lesions as described in brain and calvarium worrisome for metastatic disease on this noncontrast study. Contrast MRI suggested for further evaluation.     Cta Chest W Or W Wo Cont    Result Date: 08/17/2018  DICOM format image data is available to nonaffiliated external healthcare facilities or entities on a secure, media free, reciprocally searchable basis with patient authorization for 12 months following the date of the study. Indication: Altered mental status Technique: 3 millimeter axial images with IV contrast obtained from lung apices to bases. 3-D MIP CTA images obtained. Comparison: Frontal view chest 08/17/2018 Findings:  Mediastinum: No aortic dissection or pulmonary embolus. Calcified aortic arch and mild coronary calcifications. 10 mm low-density focus right thyroid lobe. Mild cardiomegaly. Small bilateral pleural effusions. Lungs: Numerous nodules of varying sizes throughout the lungs largest of which  left lower lobe measures 18 x 19 mm. Focal pleural thickening adjacent to rib destruction right lower lobe posterolaterally. Bones/ chest wall: Extensive lytic lesions throughout the skeletal system of the thorax including sternum, spine, and ribs. High-grade compression fractures T3 and T4 with mild retropulsion. Anterior cervical fusion. Multiple masses are seen throughout the right breast the largest of which is partially imaged laterally at 6.8 x 3.5 cm; there is overlying right breast skin thickening. Asymmetric upper normal size nodes are seen in the right axilla. Below the diaphragm liver is heterogeneously low in density.     Impression: 1. No evidence of pulmonary embolus or aortic dissection. 2. 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 3. Indeterminate lesion right thyroid lobe which could be evaluated with ultrasound. 4. Cardiomegaly. 5. Bilateral small pleural effusions. 6. Multiple lesions throughout lungs and bones consistent with metastatic disease possibly from breast  primary with asymmetric multi centric lesions throughout the right breast and overlying skin thickening with asymmetric upper normal size right axillary nodes. 7. Compression fractures T3 and T4 with retropulsion. 8. Heterogeneous liver. Metastatic disease not excluded.     Ct Abd Pelv W Cont    Result Date: 08/17/2018  DICOM format image data is available to nonaffiliated external healthcare facilities or entities on a secure, media free, reciprocally searchable basis with patient authorization for 12 months following the date of the study. TECHNIQUE: CT of the abdomen and pelvis WITH intravenous contrast.  The abdomen and pelvis were scanned utilizing a multidetector helical scanner from the diaphragm to the lesser trochanter without the oral administration of barium.  Coronal and sagittal reformations were obtained. COMPARISON: none INDICATION: Altered mental status DISCUSSION: HEPATOBILIARY: Multiple low-density ill-defined lesions throughout the liver with mild hepatomegaly. Gallbladder mildly thick-walled but otherwise unremarkable. SPLEEN: No splenomegaly or focal lesion. PANCREAS: Atrophic. ADRENALS: No adrenal nodules. KIDNEYS/URETERS: 17 mm rounded low-density right lower renal pole cystic cyst without hydronephrosis. PELVIC ORGANS/BLADDER: Uterus atrophic or removed. Extensive artifact in pelvis due to right hip replacement. PERITONEUM / RETROPERITONEUM: No free air or fluid. LYMPH NODES: No lymphadenopathy. VESSELS: Heavy calcification in nondilated aorta and iliac vessels. GI TRACT: No distention or wall thickening. No evidence of appendicitis. Large amount retained fecal material in colon and rectum. BONES AND SOFT TISSUES: Extensive lytic destruction of the bony structures throughout spine and pelvis with left hip replacement. Multiple compression fractures of vertebral bodies moderate grade at L3 and low-grade at L2 and L4 without significant retropulsion. Small fracture right superior acetabular  margin laterally.     IMPRESSION: 1. 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 Compression fractures L2 through L4. Small acute fracture suspected at superior lateral right acetabular margin. 2. Indeterminate right renal lesion likely represent cyst which could be confirmed with ultrasound. 3. Left hip replacement. 4. Uterus atrophic or removed. 5. Atherosclerotic vascular disease. 6. Constipation.       Microbiology:  All Micro Results     None           Cardiology:  Results for orders placed or performed during the hospital encounter of 08/17/18   EKG, 12 LEAD, INITIAL   Result Value Ref Range    Ventricular Rate 120 BPM    Atrial Rate 120 BPM    P-R Interval 150 ms    QRS Duration 84 ms    Q-T Interval 384 ms    QTC Calculation (Bezet) 542 ms    Calculated P Axis 73 degrees    Calculated R Axis  0 degrees    Calculated T Axis -97 degrees    Diagnosis        Poor data quality, interpretation may be adversely affected  Sinus tachycardia  Septal infarct , age undetermined  T wave abnormality, consider inferior ischemia  T wave abnormality, consider anterolateral ischemia  Prolonged QT  When compared with ECG of 17-Aug-2018 20:03,  Septal infarct is now present  T wave inversion now evident in Inferior leads  T wave inversion now evident in Anterolateral leads  Confirmed by Marrion Coy, M.D., Ellin Saba. ([id number]) on 08/21/2018 11:43:51 AM           Problem List:  Problem List as of 08/25/2018 Never Reviewed          Codes Class Noted - Resolved    Lactic acidosis ICD-10-CM: E87.2  ICD-9-CM: 276.2  08/17/2018 - Present        Altered mental status ICD-10-CM: R41.82  ICD-9-CM: 780.97  08/17/2018 - Present        Metastasis from breast cancer [facility]) ICD-10-CM: C79.9, C50.919  ICD-9-CM: 199.1, 174.9  08/17/2018 - Present              Medications reviewed  Current Facility-Administered Medications   Medication Dose Route Frequency   ??? levalbuterol (XOPENEX) nebulizer soln 1.25 mg/3 mL  1.25 mg Nebulization Q4H PRN   ???  melatonin tablet 3 mg  3 mg Oral QHS   ??? haloperidol (HALDOL) tablet 1 mg  1 mg Oral QHS PRN   ??? sodium chloride (NS) flush 5-10 mL  5-10 mL IntraVENous Q8H   ??? sodium chloride (NS) flush 5-10 mL  5-10 mL IntraVENous PRN   ??? naloxone (NARCAN) injection 0.1 mg  0.1 mg IntraVENous PRN   ??? acetaminophen (TYLENOL) tablet 650 mg  650 mg Oral Q6H PRN   ??? enoxaparin (LOVENOX) injection 40 mg  40 mg SubCUTAneous Q24H   ??? influenza vaccine 2019-20 (4 yrs+)(PF) (FLUCELVAX QUAD) injection 0.5 mL  0.5 mL IntraMUSCular PRIOR TO DISCHARGE   ??? dexamethasone (DECADRON) 4 mg/mL injection 4 mg  4 mg IntraVENous Q6H         Kristen Loader, MD

## 2018-08-25 NOTE — Progress Notes (Signed)
Palliative Care RN Progress Note  Palliative Care Team is available Monday-Friday 8 AM-4:30 PM       NAME:  Kayla [name]   DOB:   07-16-1941   MRN:   [id number]     Date/Time Patient Seen:  08/25/2018 10:14 AM    Attending Physician: Alvy Bimler, MD  Primary Care Physician: Thomasenia Sales, MD        Palliative Assessment/Plan:     Code Status:  Full Code  Goals of Care: Patients son is now in town and met with him and daughter Olegario Messier at the bedside.  They have requested to speak with oncologist Dr. Emilie Rutter to better understand the current treatment options and overall prognosis to help make future medical decisions for their mom.  Coordinated with Dr. Brandt Loosen office to please call the son Marius Ditch directly to discuss and answer questions.  They are considering changing the patients code status but want to wait until have spoken with Dr. Emilie Rutter.  Also coordinated with Dr. Maryelizabeth Kaufmann to speak with the son while he is in town today.   Disposition: TBD    Current Capacity for Decision Making:  Awake, but lethargic and confused, not decisional      Advance Care Planning:      Advance Directives:          Medical Power of Attorney: none          Living Will: none          POLST forms:  There is no POLST (Physician orders for life-sustaining treatment) form on file for this patient     No Advance Directive: Surrogate Decision Maker: [name] (daughter) [phone]; [name] (daughter) [phone]          The following were updated:  Discussed with Attending Physician/Provider: Alvy Bimler, MD   Other Physician (consultant): reviewed chart notes   Nursing: RN  Care Manager/Discharge Planner: n/a today      Clydene Pugh RN, BSN, [facility]  Palliative Medicine  [phone] office      The Palliative Medicine team is available Monday to Friday from 8am to 4:30pm at [phone]    Interval History:       HPI:      HPI: Pt is 77 year old female with breast CA followed Dr. Emilie Rutter in [place] undergoing systemic hormal therapy  with faslodex. Admitted on 08/17/18 for AMS; head/abd/pelv/chest CT in ED worrisome for brain with vasogenic edemia, lung, liver mets; pt undergoing palliative WBR but unable to tolerate lying flat and then later declined to complete simulation mapping to lumbar spine fields per Dr. Delton Prairie; planning for x1 dose of faslodex but overall poor prognosis per [facility] Dr. Lamarr Lulas. Pertinent comorbidities include HTN.  PTA medications per EMR: Lisinopril, fentanyl TD 29mcg/hr, oxycodone IR 10mg  PO Q4hr PRN.  Social History:   Religious/Cultural/Spiritual: CHRISTIAN  Insurance:  Payor: [employer] / Plan: [facility] [employer] MEDICARE / Product Type: Managed Care Medicare /    ECOG Performance Status (current): 4  Prior ECOG: 4  Palliative Performance Scale (PPS): 10-20%  Prior PPS: 20-30%  FAST Scale in Dementia: n/a  PC Consulted PP:[id number] by Dr. Willaim Bane  on 9/27  for goals of care, metastatic breast cancer     Review of Systems:   Unable to obtain due to clinical circumstance      Physical Exam:   Blood pressure 124/81, pulse 99, temperature 97.8 F (36.6 C), resp. rate 22, height 5\' 6"  (1.676 m), weight  56.7 kg (125 lb), SpO2 100 %.  Body mass index is 20.18 kg/m.  Wt Readings from Last 3 Encounters:   08/24/18 56.7 kg (125 lb)     Last Bowel Movement: Last Bowel Movement Date: 08/25/18

## 2018-08-25 NOTE — Progress Notes (Signed)
[place]  Oncology Progress Note  NAME:  Kayla [name], Kayla [name]  SEX:   F  DOB:   02-22-1941  DATE: 08/25/2018  REFERRING PHYSICIAN:    MR#    [id number]  LOCATION: RADIATION ONCOLOGY  BILLING#  [id number]    cc:    The patient today underwent treatment #5 of 10 planned whole brain treatments.  She remains an inpatient for medical management.  The situation was again discussed with the patient's family.  Port films were checked for accuracy and treatment is to continue tomorrow as planned.      ___________________  Oliver Barre MD  Dictated By: .   [facility]  D:08/25/2018 14:54:21  T: 08/25/2018  [id number]

## 2018-08-25 NOTE — Progress Notes (Signed)
Problem: Falls - Risk of  Goal: *Absence of Falls  Description  Document Kayla [name] Fall Risk and appropriate interventions in the flowsheet.  Outcome: Progressing Towards Goal  Note:   Fall Risk Interventions:  Mobility Interventions: Bed/chair exit alarm, Communicate number of staff needed for ambulation/transfer, Mechanical lift, OT consult for ADLs, Patient to call before getting OOB, PT Consult for mobility concerns, PT Consult for assist device competence    Mentation Interventions: Adequate sleep, hydration, pain control, Bed/chair exit alarm, Door open when patient unattended, Eyeglasses and hearing aids, Familiar objects from home, Family/sitter at bedside, Increase mobility, More frequent rounding, Reorient patient, Room close to nurse's station, Update white board    Medication Interventions: Bed/chair exit alarm, Evaluate medications/consider consulting pharmacy, Patient to call before getting OOB    Elimination Interventions: Bed/chair exit alarm, Call light in reach, Patient to call for help with toileting needs, Toileting schedule/hourly rounds    History of Falls Interventions: Bed/chair exit alarm, Consult care management for discharge planning, Investigate reason for fall, Room close to nurse's station         Problem: Patient Education: Go to Patient Education Activity  Goal: Patient/Family Education  Outcome: Progressing Towards Goal

## 2018-08-26 LAB — CBC WITH AUTOMATED DIFF
BASOPHILS: 0.3 % (ref 0–3)
EOSINOPHILS: 0 % (ref 0–5)
HCT: 31.2 % — ABNORMAL LOW (ref 37.0–50.0)
HGB: 10.1 gm/dl — ABNORMAL LOW (ref 13.0–17.2)
IMMATURE GRANULOCYTES: 4.5 % — ABNORMAL HIGH (ref 0.0–3.0)
LYMPHOCYTES: 1.6 % — ABNORMAL LOW (ref 28–48)
MCH: 28.5 pg (ref 25.4–34.6)
MCHC: 32.4 gm/dl (ref 30.0–36.0)
MCV: 87.9 fL (ref 80.0–98.0)
MONOCYTES: 8.5 % (ref 1–13)
MPV: 10.9 fL — ABNORMAL HIGH (ref 6.0–10.0)
NEUTROPHILS: 85.1 % — ABNORMAL HIGH (ref 34–64)
NRBC: 2 — ABNORMAL HIGH (ref 0–0)
PLATELET: 203 10*3/uL (ref 140–450)
RBC: 3.55 M/uL — ABNORMAL LOW (ref 3.60–5.20)
RDW-SD: 62.4 — ABNORMAL HIGH (ref 36.4–46.3)
WBC: 7.1 10*3/uL (ref 4.0–11.0)

## 2018-08-26 LAB — MAGNESIUM
Magnesium: 2.2 mg/dl (ref 1.6–2.6)
Magnesium: 2.2 mg/dl (ref 1.6–2.6)

## 2018-08-26 LAB — RENAL FUNCTION PANEL
Albumin: 2.4 gm/dl — ABNORMAL LOW (ref 3.4–5.0)
Albumin: 2.4 gm/dl — ABNORMAL LOW (ref 3.4–5.0)
Anion Gap: 11 mmol/L (ref 5–15)
Anion gap: 11 mmol/L (ref 5–15)
BUN: 26 mg/dl — ABNORMAL HIGH (ref 7–25)
BUN: 26 mg/dl — ABNORMAL HIGH (ref 7–25)
CO2: 28 mEq/L (ref 21–32)
CO2: 28 mEq/L (ref 21–32)
Calcium: 8.8 mg/dl (ref 8.5–10.1)
Calcium: 8.8 mg/dl (ref 8.5–10.1)
Chloride: 102 mEq/L (ref 98–107)
Chloride: 102 mEq/L (ref 98–107)
Creatinine: 0.7 mg/dl (ref 0.6–1.3)
Creatinine: 0.7 mg/dl (ref 0.6–1.3)
EGFR IF NonAfrican American: >60
GFR African American: >60
GFR est AA: >60
GFR est non-AA: >60
Glucose: 143 mg/dl — ABNORMAL HIGH (ref 74–106)
Glucose: 143 mg/dl — ABNORMAL HIGH (ref 74–106)
Phosphorus: 2.5 mg/dl (ref 2.5–4.9)
Phosphorus: 2.5 mg/dl (ref 2.5–4.9)
Potassium: 4 mEq/L (ref 3.5–5.1)
Potassium: 4 mEq/L (ref 3.5–5.1)
Sodium: 141 mEq/L (ref 136–145)
Sodium: 141 mEq/L (ref 136–145)

## 2018-08-26 LAB — CALCIUM, IONIZED
CALCIUM,IONIZED: 4.2 mg/dl — ABNORMAL LOW (ref 4.4–5.4)
CALCIUM,IONIZED: 4.2 mg/dl — ABNORMAL LOW (ref 4.4–5.4)

## 2018-08-26 LAB — CBC WITH AUTO DIFFERENTIAL
Basophils %: 0.3 % (ref 0–3)
Eosinophils %: 0 % (ref 0–5)
Hematocrit: 31.2 % — ABNORMAL LOW (ref 37.0–50.0)
Hemoglobin: 10.1 gm/dl — ABNORMAL LOW (ref 13.0–17.2)
Immature Granulocytes: 4.5 % — ABNORMAL HIGH (ref 0.0–3.0)
Lymphocytes %: 1.6 % — ABNORMAL LOW (ref 28–48)
MCH: 28.5 pg (ref 25.4–34.6)
MCHC: 32.4 gm/dl (ref 30.0–36.0)
MCV: 87.9 fL (ref 80.0–98.0)
MPV: 10.9 fL — ABNORMAL HIGH (ref 6.0–10.0)
Monocytes %: 8.5 % (ref 1–13)
Neutrophils %: 85.1 % — ABNORMAL HIGH (ref 34–64)
Nucleated RBCs: 2 — ABNORMAL HIGH (ref 0–0)
Platelets: 203 10*3/uL (ref 140–450)
RBC: 3.55 M/uL — ABNORMAL LOW (ref 3.60–5.20)
RDW-SD: 62.4 — ABNORMAL HIGH (ref 36.4–46.3)
WBC: 7.1 10*3/uL (ref 4.0–11.0)

## 2018-08-26 MED ORDER — LEVALBUTEROL 1.25 MG/3 ML SOLN FOR INHALATION
1.25 mg/3 mL | Freq: Four times a day (QID) | RESPIRATORY_TRACT | Status: DC
Start: 2018-08-26 — End: 2018-08-28
  Administered 2018-08-26 – 2018-08-29 (×12): via RESPIRATORY_TRACT

## 2018-08-26 MED ORDER — GUAIFENESIN 100 MG/5 ML ORAL LIQUID
100 mg/5 mL | ORAL | Status: DC | PRN
Start: 2018-08-26 — End: 2018-09-03
  Administered 2018-08-26 (×2): via ORAL

## 2018-08-26 MED FILL — HALOPERIDOL 1 MG TAB: 1 mg | ORAL | Qty: 1

## 2018-08-26 MED FILL — FUROSEMIDE 20 MG TAB: 20 mg | ORAL | Qty: 1

## 2018-08-26 MED FILL — DEXAMETHASONE SODIUM PHOSPHATE 4 MG/ML IJ SOLN: 4 mg/mL | INTRAMUSCULAR | Qty: 1

## 2018-08-26 MED FILL — POTASSIUM CHLORIDE SR 10 MEQ TAB: 10 mEq | ORAL | Qty: 1

## 2018-08-26 MED FILL — BD POSIFLUSH NORMAL SALINE 0.9 % INJECTION SYRINGE: INTRAMUSCULAR | Qty: 10

## 2018-08-26 MED FILL — NYSTATIN 100,000 UNIT/ML ORAL SUSP: 100000 unit/mL | ORAL | Qty: 5

## 2018-08-26 MED FILL — GUAIFENESIN 100 MG/5 ML ORAL LIQUID: 100 mg/5 mL | ORAL | Qty: 10

## 2018-08-26 MED FILL — LEVALBUTEROL 1.25 MG/3 ML SOLN FOR INHALATION: 1.25 mg/3 mL | RESPIRATORY_TRACT | Qty: 3

## 2018-08-26 MED FILL — LOVENOX 40 MG/0.4 ML SUBCUTANEOUS SYRINGE: 40 mg/0.4 mL | SUBCUTANEOUS | Qty: 0.4

## 2018-08-26 MED FILL — MELATONIN 3 MG TAB: 3 mg | ORAL | Qty: 1

## 2018-08-26 NOTE — Progress Notes (Signed)
Problem: Falls - Risk of  Goal: *Absence of Falls  Description  Document Kayla [name] Fall Risk and appropriate interventions in the flowsheet.  Outcome: Progressing Towards Goal  Note:   Fall Risk Interventions:  Mobility Interventions: Assess mobility with egress test, Bed/chair exit alarm, Patient to call before getting OOB, PT Consult for mobility concerns, PT Consult for assist device competence    Mentation Interventions: Adequate sleep, hydration, pain control, Bed/chair exit alarm, Door open when patient unattended, More frequent rounding, Reorient patient, Toileting rounds    Medication Interventions: Bed/chair exit alarm    Elimination Interventions: Bed/chair exit alarm, Call light in reach, Patient to call for help with toileting needs, Toilet paper/wipes in reach, Toileting schedule/hourly rounds    History of Falls Interventions: Bed/chair exit alarm, Door open when patient unattended         Problem: Breathing Pattern - Ineffective  Goal: *Absence of hypoxia  Outcome: Progressing Towards Goal

## 2018-08-26 NOTE — Progress Notes (Signed)
Problem: Falls - Risk of  Goal: *Absence of Falls  Description  Document Kayla [name] Fall Risk and appropriate interventions in the flowsheet.  Outcome: Progressing Towards Goal  Note:   Fall Risk Interventions:  Mobility Interventions: Bed/chair exit alarm, Patient to call before getting OOB    Mentation Interventions: Bed/chair exit alarm, Adequate sleep, hydration, pain control, Family/sitter at bedside    Medication Interventions: Bed/chair exit alarm    Elimination Interventions: Bed/chair exit alarm, Call light in reach    History of Falls Interventions: Bed/chair exit alarm         Problem: Pressure Injury - Risk of  Goal: *Prevention of pressure injury  Description  Document Braden Scale and appropriate interventions in the flowsheet.  Outcome: Progressing Towards Goal  Note:   Pressure Injury Interventions:  Sensory Interventions: Float heels, Keep linens dry and wrinkle-free, Minimize linen layers, Pressure redistribution bed/mattress (bed type)    Moisture Interventions: Internal/External urinary devices    Activity Interventions: Pressure redistribution bed/mattress(bed type)    Mobility Interventions: HOB 30 degrees or less, Pressure redistribution bed/mattress (bed type)    Nutrition Interventions: Document food/fluid/supplement intake    Friction and Shear Interventions: HOB 30 degrees or less, Lift sheet, Minimize layers

## 2018-08-26 NOTE — Other (Signed)
Bedside and Verbal shift change report given to Joan Del Rosario Bergunio, RN   (oncoming nurse) by Maria Theresa Ongoco R.N. (offgoing nurse). Report included the following information SBAR.

## 2018-08-26 NOTE — Progress Notes (Addendum)
Page Sent      PAGER ID: [phone]   MESSAGE: Rm [id number] [place], Catheline,pt of Dr.Munnoz, dx AMS, metastatic breast ca, pt w/ on and off dry cough, family is requesting if she can be given cough syrup, breathing tx given, CXR 09-30 lung mets, small bil pleural eff. [id number] terry  =========================  0113 DR.Morris with return call and ordered robitussin 50ml p.o. q4h prn for cough

## 2018-08-26 NOTE — Progress Notes (Signed)
Transfer Out at 08/25/2018 1833     Unit: [facility] 3 STEPDOWN   Palliative care seeing pt. Updates in edc for SNF to view if family wants to proceed with SNF for rehab she will need auth when stable to dc,.

## 2018-08-26 NOTE — Progress Notes (Signed)
[place]  Oncology Progress Note  NAME:  Koontz, Lilliona  SEX:   F  DOB:   1941/01/19  DATE: 08/26/2018  REFERRING PHYSICIAN:    MR#    [id number]  LOCATION: [facility] ONCOLOGY  BILLING#  [id number]    cc:     The patient has now completed 6 of 10 planned whole brain treatments.  This totals 1800 cGy.  She remains an inpatient for medical management.  Port films were checked for accuracy and treatment is to continue tomorrow as planned.      ___________________  Oliver Barre MD  Dictated By: .   SB  D:08/26/2018 51:02:58  T: 08/26/2018  [id number]

## 2018-08-26 NOTE — Progress Notes (Signed)
Page Sent      PAGER ID: [phone]   MESSAGE: Rm [id number] Moede,Kayla [name], needs new iv line pls. thank you. daughter prefers it on upper arm. Thank you. [phone] Kayla [name]  (115 character message out of a maximum of 240)       Close [X]     Send Another Page

## 2018-08-26 NOTE — Other (Signed)
Bedside and Verbal shift change report given to Rachelle A Ventura, RN (oncoming nurse) by Joan, RN (offgoing nurse). Report included the following information SBAR and Kardex.

## 2018-08-26 NOTE — Progress Notes (Signed)
Coordinated family conference call with Dr. Rexford Maus and the 4 children this afternoon at 2:00.

## 2018-08-26 NOTE — Progress Notes (Signed)
Problem: Falls - Risk of  Goal: *Absence of Falls  Description  Document Kayla [name] Fall Risk and appropriate interventions in the flowsheet.  Outcome: Progressing Towards Goal  Note:   Fall Risk Interventions:  Mobility Interventions: Bed/chair exit alarm, OT consult for ADLs, Patient to call before getting OOB, PT Consult for mobility concerns, PT Consult for assist device competence    Mentation Interventions: Adequate sleep, hydration, pain control, Bed/chair exit alarm, Door open when patient unattended, More frequent rounding, Reorient patient, Toileting rounds, Update white board    Medication Interventions: Bed/chair exit alarm, Patient to call before getting OOB, Teach patient to arise slowly    Elimination Interventions: Bed/chair exit alarm, Call light in reach, Patient to call for help with toileting needs, Toileting schedule/hourly rounds    History of Falls Interventions: Bed/chair exit alarm, Door open when patient unattended, Room close to nurse's station         Problem: Patient Education: Go to Patient Education Activity  Goal: Patient/Family Education  Outcome: Progressing Towards Goal     Problem: Pressure Injury - Risk of  Goal: *Prevention of pressure injury  Description  Document Braden Scale and appropriate interventions in the flowsheet.  Outcome: Progressing Towards Goal  Note:   Pressure Injury Interventions:  Sensory Interventions: Float heels, Keep linens dry and wrinkle-free, Pressure redistribution bed/mattress (bed type)    Moisture Interventions: Internal/External urinary devices, Maintain skin hydration (lotion/cream), Moisture barrier    Activity Interventions: Pressure redistribution bed/mattress(bed type), PT/OT evaluation    Mobility Interventions: Float heels, HOB 30 degrees or less, Pressure redistribution bed/mattress (bed type), PT/OT evaluation    Nutrition Interventions: Document food/fluid/supplement intake    Friction and Shear Interventions: HOB 30 degrees or less

## 2018-08-26 NOTE — Progress Notes (Signed)
Hospitalist Progress Note  Kristen Loader, MD  [facility] Hospitalists    Daily Progress Note: 08/26/2018    Assessment/Plan:     Vasogenic cerebral edema??from??brain mass??  Suspected to be metastatic breast cancer  Pt has metabolic encephalopathy from this  Pt is on steroids and a 10 fraction course of XRT (ending 10/8)  Dr Joylene Draft is following  She looks better than on admission per the family  Prognosis is poor.  ??  Dehydration  Rehydrated.??    Dyspnea  Probably mostly metastatic dz as CxR did not show CHF/edema and her lungs are clear on exam.  Pt did respond to Lasix though so there could be some CHF contribution  Added Lasix 20mg  daily (10/1)  ??  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   Family is taking things day by day and want to see what options are available  Per Dr Olene Floss note on 9/27, it looks like it will be just hormonal therapy and XRT.  Onc noted prognosis is poor.  Dr Rexford Maus gave Faslodex on 9/26 and the plan is to continue injections q2 weeks??  I spoke with Dr. Aldona Bar of oncology (10/1) who noted that the patient's performance status is poor and until she can get better, and the patient can go to outpatient follow-up with Dr. Brent General, he noted that the patient would not be eligible for any sort of chemotherapy or immunotherapy at this time.    He noted that the patient is only able to tolerate hormonal therapy  ??  Hypercalcemia  S/p Bisphophonate.  Ca coming down and is with some hypocalcemia  ??  SIRS  Sepsis ruled out  ??  Severe protein calorie malnutrition??  Pt had been with poor PO, but family noted 9/30 that it is     Generalized weakness  PT/OT re-ordered  ??  Insomnia  Melatonin for sleep  ??  Code status. DNR    Disposition  Family aware pt is doing poorly, but would like to see if pt can bounce  back and get strong enough to get Chemo or Immunotherapy    ------------------    36 minutes were spent on the patient of which more than 50% was spent in coordination of care and counseling (time spent with patient/family face to face, physical exam, reviewing laboratory and imaging investigations, speaking with physicians and nursing staff involved in this patient's care)    ------------------  Physical exam:  General: Alert, NAD, pleasant.  Cooperative.   HEENT: Sclera anicteric, PERRL, OM moist, throat clear.  Chest:  CTA B, no wheeze, no rales, no rhonchi.  Good air movement.  CV: RRR, S1/S2 were normal, no murmur  Abdomen: NTND, soft, NABS, no masses were noted.  No rebound, no guarding.  Extremities: No edema.    Subjective:   3 daughters and a son at bedside. We discussed XRT ongoing.  We discussed the patient's current performance status which is quite poor at this time.  I would know that I spoke with Dr. Edd Arbour of oncology who noted that the patient's performance status is what is limiting options for the patient.  As she would not be able to tolerate additional chemo or immunotherapy in her current condition and that at this time hormonal therapy is only treatment she has.  We discussed continuing the radiation therapy for now  and then but that we would try to get PT OT to work with the patient to get her stronger so she will be able to make it to the outpatient oncology office.  Also discussed CODE STATUS which is to be DNR      Objective:     Visit Vitals  BP 144/63 (BP 1 Location: Left arm, BP Patient Position: Supine)   Pulse (!) 104   Temp 97.7 ??F (36.5 ??C)   Resp 18   Ht 5\' 6"  (1.676 m)   Wt 56.7 kg (125 lb)   SpO2 97%   BMI 20.18 kg/m??    O2 Flow Rate (L/min): 2 l/min O2 Device: Room air    Temp (24hrs), Avg:97.8 ??F (36.6 ??C), Min:97.3 ??F (36.3 ??C), Max:98.2 ??F (36.8 ??C)    10/02 0701 - 10/02 1900  In: 190 [P.O.:190]  Out: 200 [Urine:200]   09/30 1901 - 10/02 0700  In: 13 [P.O.:50]   Out: 800 [Urine:300]      Data Review:       Recent Days:  Recent Labs     08/26/18  0646   WBC 7.1   HGB 10.1*   HCT 31.2*   PLT 203     Recent Labs     08/26/18  0646 08/25/18  0844 08/24/18  1516 08/24/18  0730   NA 141 140  --  140   K 4.0 4.3  --  3.6   CL 102 104  --  104   CO2 28 30  --  26   GLU 143* 119*  --  185*   BUN 26* 26*  --  22   CREA 0.7 0.7  --  0.8   CA 8.8 8.4*  --  9.1   MG 2.2 2.0  --   --    PHOS 2.5 3.0 1.8* 1.5*   ALB 2.4* 2.5*  --  2.7*     Recent Labs     08/24/18  0514   PH 7.483*   PCO2 32.6*   PO2 70.0*   HCO3 24.5       24 Hour Results:  Recent Results (from the past 24 hour(s))   MAGNESIUM    Collection Time: 08/26/18  6:46 AM   Result Value Ref Range    Magnesium 2.2 1.6 - 2.6 mg/dl   RENAL FUNCTION PANEL    Collection Time: 08/26/18  6:46 AM   Result Value Ref Range    Sodium 141 136 - 145 mEq/L    Potassium 4.0 3.5 - 5.1 mEq/L    Chloride 102 98 - 107 mEq/L    CO2 28 21 - 32 mEq/L    Glucose 143 (H) 74 - 106 mg/dl    BUN 26 (H) 7 - 25 mg/dl    Creatinine 0.7 0.6 - 1.3 mg/dl    GFR est AA >60.0      GFR est non-AA >60      Calcium 8.8 8.5 - 10.1 mg/dl    Albumin 2.4 (L) 3.4 - 5.0 gm/dl    Phosphorus 2.5 2.5 - 4.9 mg/dl    Anion gap 11 5 - 15 mmol/L   CBC WITH AUTOMATED DIFF    Collection Time: 08/26/18  6:46 AM   Result Value Ref Range    WBC 7.1 4.0 - 11.0 1000/mm3    RBC 3.55 (L) 3.60 - 5.20 M/uL    HGB 10.1 (L) 13.0 - 17.2 gm/dl    HCT 31.2 (L)  37.0 - 50.0 %    MCV 87.9 80.0 - 98.0 fL    MCH 28.5 25.4 - 34.6 pg    MCHC 32.4 30.0 - 36.0 gm/dl    PLATELET 203 140 - 450 1000/mm3    MPV 10.9 (H) 6.0 - 10.0 fL    RDW-SD 62.4 (H) 36.4 - 46.3      NRBC 2 (H) 0 - 0      IMMATURE GRANULOCYTES 4.5 (H) 0.0 - 3.0 %    NEUTROPHILS 85.1 (H) 34 - 64 %    LYMPHOCYTES 1.6 (L) 28 - 48 %    MONOCYTES 8.5 1 - 13 %    EOSINOPHILS 0.0 0 - 5 %    BASOPHILS 0.3 0 - 3 %   CALCIUM, IONIZED    Collection Time: 08/26/18  6:46 AM   Result Value Ref Range    CALCIUM,IONIZED 4.2 (L) 4.4 - 5.4 mg/dl        Xr Chest Sngl V    Result Date: 08/24/2018  INDICATION: Wheezing, SOB   EXAMINATION: XR CHEST SNGL V COMPARISON: 08/17/2018 FINDINGS: Mild cardiomegaly.. Small bilateral pleural effusions. Multiple focal lung lesions bilaterally. Diffuse osteolytic bony changes..        IMPRESSION: 1. Mild cardiomegaly. 2. Small bilateral pleural effusions. 3. Multiple pulmonary nodules consistent with metastases. 4. Diffuse osteolytic lesions consistent with bony metastases.     Xr Chest Sngl V    Result Date: 08/17/2018  Indication: Short of breath Frontal view chest Heart mildly enlarged. Shallow inspiration with interstitial and patchy alveolar densities throughout the lungs. Anterior cervical fusion at 3 adjacent lower cervical levels. Degenerative narrowing and sclerosis bilateral humeral joint. This rotated to the right.     IMPRESSION: Edema versus multifocal pneumonia or chronic changes bilaterally. Cardiomegaly. Postsurgical changes cervical spine.     Ct Head Wo Cont    Result Date: 08/17/2018  DICOM format image data is available to nonaffiliated external healthcare facilities or entities on a secure, media free, reciprocally searchable basis with patient authorization for 12 months following the date of the study. Indication: Altered mental status Technique:  5 millimeter axial images without contrast were obtained through the brain. Comparison: none Findings:   There is generalized moderate cerebral atrophy.  Low attenuation surrounds the lateral ventricles. There is a rounded 16 x 14 mm mass in the right thalamus surrounding low-density edema. The mass is hyperdense peripherally and low density centrally. There is also focal low-density in the right parietal white matter suggesting vasogenic edema. Focal low-density seen more laterally in the right parietal cortex measuring 2.2 cm diameter. Apparent 2.6 cm x 2.2 cm mass is seen in the right cerebellum with surrounding low-density suggesting vasogenic edema.  Asymmetric 11 mm low-density irregular lytic lesion is seen in the diploic space of the right parietal bone. No hemorrhage seen. Sella, pineal, craniocervical junction, orbits, paranasal sinuses, and temporal bones are unremarkable.      Impression: cerebral atrophy and nonspecific white matter changes not unusual for age; multiple lesions as described in brain and calvarium worrisome for metastatic disease on this noncontrast study. Contrast MRI suggested for further evaluation.     Cta Chest W Or W Wo Cont    Result Date: 08/17/2018  DICOM format image data is available to nonaffiliated external healthcare facilities or entities on a secure, media free, reciprocally searchable basis with patient authorization for 12 months following the date of the study. Indication: Altered mental status Technique: 3 millimeter axial images with  IV contrast obtained from lung apices to bases. 3-D MIP CTA images obtained. Comparison: Frontal view chest 08/17/2018 Findings:  Mediastinum: No aortic dissection or pulmonary embolus. Calcified aortic arch and mild coronary calcifications. 10 mm low-density focus right thyroid lobe. Mild cardiomegaly. Small bilateral pleural effusions. Lungs: Numerous nodules of varying sizes throughout the lungs largest of which left lower lobe measures 18 x 19 mm. Focal pleural thickening adjacent to rib destruction right lower lobe posterolaterally. Bones/ chest wall: Extensive lytic lesions throughout the skeletal system of the thorax including sternum, spine, and ribs. High-grade compression fractures T3 and T4 with mild retropulsion. Anterior cervical fusion. Multiple masses are seen throughout the right breast the largest of which is partially imaged laterally at 6.8 x 3.5 cm; there is overlying right breast skin thickening. Asymmetric upper normal size nodes are seen in the right axilla. Below the diaphragm liver is heterogeneously low in density.      Impression: 1. No evidence of pulmonary embolus or aortic dissection. 2. 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 3. Indeterminate lesion right thyroid lobe which could be evaluated with ultrasound. 4. Cardiomegaly. 5. Bilateral small pleural effusions. 6. Multiple lesions throughout lungs and bones consistent with metastatic disease possibly from breast primary with asymmetric multi centric lesions throughout the right breast and overlying skin thickening with asymmetric upper normal size right axillary nodes. 7. Compression fractures T3 and T4 with retropulsion. 8. Heterogeneous liver. Metastatic disease not excluded.     Ct Abd Pelv W Cont    Result Date: 08/17/2018  DICOM format image data is available to nonaffiliated external healthcare facilities or entities on a secure, media free, reciprocally searchable basis with patient authorization for 12 months following the date of the study. TECHNIQUE: CT of the abdomen and pelvis WITH intravenous contrast.  The abdomen and pelvis were scanned utilizing a multidetector helical scanner from the diaphragm to the lesser trochanter without the oral administration of barium.  Coronal and sagittal reformations were obtained. COMPARISON: none INDICATION: Altered mental status DISCUSSION: HEPATOBILIARY: Multiple low-density ill-defined lesions throughout the liver with mild hepatomegaly. Gallbladder mildly thick-walled but otherwise unremarkable. SPLEEN: No splenomegaly or focal lesion. PANCREAS: Atrophic. ADRENALS: No adrenal nodules. KIDNEYS/URETERS: 17 mm rounded low-density right lower renal pole cystic cyst without hydronephrosis. PELVIC ORGANS/BLADDER: Uterus atrophic or removed. Extensive artifact in pelvis due to right hip replacement. PERITONEUM / RETROPERITONEUM: No free air or fluid. LYMPH NODES: No lymphadenopathy. VESSELS: Heavy calcification in nondilated aorta and iliac vessels. GI TRACT: No  distention or wall thickening. No evidence of appendicitis. Large amount retained fecal material in colon and rectum. BONES AND SOFT TISSUES: Extensive lytic destruction of the bony structures throughout spine and pelvis with left hip replacement. Multiple compression fractures of vertebral bodies moderate grade at L3 and low-grade at L2 and L4 without significant retropulsion. Small fracture right superior acetabular margin laterally.     IMPRESSION: 1. 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 Compression fractures L2 through L4. Small acute fracture suspected at superior lateral right acetabular margin. 2. Indeterminate right renal lesion likely represent cyst which could be confirmed with ultrasound. 3. Left hip replacement. 4. Uterus atrophic or removed. 5. Atherosclerotic vascular disease. 6. Constipation.       Microbiology:  All Micro Results     None           Cardiology:  Results for orders placed or performed during the hospital encounter of 08/17/18   EKG, 12 LEAD, INITIAL   Result Value  Ref Range    Ventricular Rate 120 BPM    Atrial Rate 120 BPM    P-R Interval 150 ms    QRS Duration 84 ms    Q-T Interval 384 ms    QTC Calculation (Bezet) 542 ms    Calculated P Axis 73 degrees    Calculated R Axis 0 degrees    Calculated T Axis -97 degrees    Diagnosis        Poor data quality, interpretation may be adversely affected  Sinus tachycardia  Septal infarct , age undetermined  T wave abnormality, consider inferior ischemia  T wave abnormality, consider anterolateral ischemia  Prolonged QT  When compared with ECG of 17-Aug-2018 20:03,  Septal infarct is now present  T wave inversion now evident in Inferior leads  T wave inversion now evident in Anterolateral leads  Confirmed by Marrion Coy, M.D., Ellin Saba. ([id number]) on 08/21/2018 11:43:51 AM           Problem List:  Problem List as of 08/26/2018 Never Reviewed          Codes Class Noted - Resolved    Lactic acidosis ICD-10-CM: E87.2   ICD-9-CM: 276.2  08/17/2018 - Present        Altered mental status ICD-10-CM: R41.82  ICD-9-CM: 780.97  08/17/2018 - Present        Metastasis from breast cancer [facility]) ICD-10-CM: C79.9, C50.919  ICD-9-CM: 199.1, 174.9  08/17/2018 - Present              Medications reviewed  Current Facility-Administered Medications   Medication Dose Route Frequency   ??? levalbuterol (XOPENEX) nebulizer soln 1.25 mg/3 mL  1.25 mg Nebulization Q6H RT   ??? guaiFENesin (ROBITUSSIN) 100 mg/5 mL oral liquid 200 mg  200 mg Oral Q4H PRN   ??? furosemide (LASIX) tablet 20 mg  20 mg Oral DAILY   ??? potassium chloride SR (KLOR-CON 10) tablet 10 mEq  10 mEq Oral DAILY   ??? ibuprofen (MOTRIN) tablet 400 mg  400 mg Oral Q6H PRN   ??? nystatin (MYCOSTATIN) 100,000 unit/mL oral suspension 500,000 Units  500,000 Units Oral QID   ??? levalbuterol (XOPENEX) nebulizer soln 1.25 mg/3 mL  1.25 mg Nebulization Q4H PRN   ??? melatonin tablet 3 mg  3 mg Oral QHS   ??? haloperidol (HALDOL) tablet 1 mg  1 mg Oral QHS PRN   ??? sodium chloride (NS) flush 5-10 mL  5-10 mL IntraVENous Q8H   ??? sodium chloride (NS) flush 5-10 mL  5-10 mL IntraVENous PRN   ??? naloxone (NARCAN) injection 0.1 mg  0.1 mg IntraVENous PRN   ??? acetaminophen (TYLENOL) tablet 650 mg  650 mg Oral Q6H PRN   ??? enoxaparin (LOVENOX) injection 40 mg  40 mg SubCUTAneous Q24H   ??? influenza vaccine 2019-20 (4 yrs+)(PF) (FLUCELVAX QUAD) injection 0.5 mL  0.5 mL IntraMUSCular PRIOR TO DISCHARGE   ??? dexamethasone (DECADRON) 4 mg/mL injection 4 mg  4 mg IntraVENous Q6H         Kristen Loader, MD

## 2018-08-26 NOTE — Progress Notes (Signed)
Palliative Care RN Progress Note  Palliative Care Team is available Monday-Friday 8 AM-4:30 PM       NAME:  Kayla [name]   DOB:   February 05, 1941   MRN:   [id number]     Date/Time Patient Seen:  08/26/2018 10:14 AM    Attending Physician: Kristen Loader, MD  Primary Care Physician: Henrene Pastor, MD        Palliative Assessment/Plan:     Code Status:  DNR (confirmed by children today)  Goals of Care: Continue current treatment, children understand patients overall prognosis is poor and her only option at this time is to work on increasing her strength/conditiong to potentially be able to tolerate chemotherapy in the next few weeks.  In the meantime they want to focus on PT/OT and increasing her appetite/nutrition.  They also confirmed wanting to change her code status to DNR.      Long meeting via conference call with Dr. Rexford Maus and 3/4 of the children, greatly appreciate Dr. Rexford Maus for participating and answering all of their questions.      Disposition: TBD, potential rehab upon discharge unless patient declines any further and would need comfort/hospice    Current Capacity for Decision Making:  Awake, but lethargic and confused, not decisional      Advance Care Planning:      Advance Directives:          Medical Power of Attorney: none          Living Will: none          POLST forms:  There is no POLST (Physician orders for life-sustaining treatment) form on file for this patient     No Advance Directive: Surrogate Decision Maker: [name] (daughter) [phone]; [name] (daughter) [phone]; Marya Amsler (daughter) [phone]; Cherie Ouch (son) [phone]          The following were updated:  Discussed with Attending Physician/Provider: Kristen Loader, MD   Other Physician (consultant): reviewed chart notes   Nursing: RN  Care Manager/Discharge Planner: n/a today      Aline August RN, BSN, [facility]  Palliative Medicine  [phone] office       The Palliative Medicine team is available Monday to Friday from 8am to 4:30pm at [phone]    Interval History:       HPI:      HPI: Pt is 77 year old female with breast CA followed Dr. Brent General in [place] undergoing systemic hormal therapy with faslodex. Admitted on 08/17/18 for AMS; head/abd/pelv/chest CT in ED worrisome for brain with vasogenic edemia, lung, liver mets; pt undergoing palliative WBR but unable to tolerate lying flat and then later declined to complete simulation mapping to lumbar spine fields per Dr. Callie Fielding; planning for x1 dose of faslodex but overall poor prognosis per [facility] Dr. Rexford Maus. Pertinent comorbidities include HTN.  PTA medications per EMR: Lisinopril, fentanyl TD 56mcg/hr, oxycodone IR 10mg  PO Q4hr PRN.  Social History:   Religious/Cultural/Spiritual: CHRISTIAN  Insurance:  Payor: Psychologist, prison and probation services / Plan: [place] / Product Type: Managed Care Medicare /    ECOG Performance Status (current): 4  Prior ECOG: 4  Palliative Performance Scale (PPS): 10-20%  Prior PPS: 20-30%  FAST Scale in Dementia: n/a  PC Consulted [id number] by Dr. Nechama Guard  on 9/27  for goals of care, metastatic breast cancer     Review of Systems:   Unable to obtain due to clinical circumstance      Physical Exam:  Blood pressure 139/85, pulse (!) 110, temperature 97.9 ??F (36.6 ??C), resp. rate 18, height 5\' 6"  (1.676 m), weight 56.7 kg (125 lb), SpO2 99 %.  Body mass index is 20.18 kg/m??.  Wt Readings from Last 3 Encounters:   08/24/18 56.7 kg (125 lb)     Last Bowel Movement: Last Bowel Movement Date: 08/26/18

## 2018-08-26 NOTE — Progress Notes (Signed)
 Palliative Care RN Progress Note  Palliative Care Team is available Monday-Friday 8 AM-4:30 PM       NAME:  Kayla [name]   DOB:   12/23/1940   MRN:   [id number]     Date/Time Patient Seen:  08/26/2018 10:14 AM    Attending Physician: Lorriane Oliva BRAVO, MD  Primary Care Physician: Danso, Michael A, MD        Palliative Assessment/Plan:     Code Status:  DNR (confirmed by children today)  Goals of Care: Continue current treatment, children understand patients overall prognosis is poor and her only option at this time is to work on increasing her strength/conditiong to potentially be able to tolerate chemotherapy in the next few weeks.  In the meantime they want to focus on PT/OT and increasing her appetite/nutrition.  They also confirmed wanting to change her code status to DNR.      Long meeting via conference call with Dr. Linnette and 3/4 of the children, greatly appreciate Dr. Linnette for participating and answering all of their questions.      Disposition: TBD, potential rehab upon discharge unless patient declines any further and would need comfort/hospice    Current Capacity for Decision Making:  Awake, but lethargic and confused, not decisional      Advance Care Planning:      Advance Directives:          Medical Power of Attorney: none          Living Will: none          POLST forms:  There is no POLST (Physician orders for life-sustaining treatment) form on file for this patient     No Advance Directive: Surrogate Decision Maker: Kayla [name] (daughter) [phone]; Kayla [name] (daughter) [phone]; Kayla [name] (daughter) [phone]; Kayla [name] (son) [phone]          The following were updated:  Discussed with Attending Physician/Provider: Lorriane Oliva BRAVO, MD   Other Physician (consultant): reviewed chart notes   Nursing: RN  Care Manager/Discharge Planner: n/a today      Ronnald Brain RN, BSN, [facility]  Palliative Medicine  [phone] office      The Palliative Medicine team is available Monday to Friday from  8am to 4:30pm at [phone]    Interval History:       HPI:      HPI: Pt is 77 year old female with breast CA followed Dr. Danso in [place] undergoing systemic hormal therapy with faslodex. Admitted on 08/17/18 for AMS; head/abd/pelv/chest CT in ED worrisome for brain with vasogenic edemia, lung, liver mets; pt undergoing palliative WBR but unable to tolerate lying flat and then later declined to complete simulation mapping to lumbar spine fields per Dr. Marge; planning for x1 dose of faslodex but overall poor prognosis per VOA Dr. Linnette. Pertinent comorbidities include HTN.  PTA medications per EMR: Lisinopril, fentanyl TD 50mcg/hr, oxycodone IR 10mg  PO Q4hr PRN.  Social History:   Religious/Cultural/Spiritual: CHRISTIAN  Insurance:  Payor: [employer] / Plan: [facility] [employer] MEDICARE / Product Type: Managed Care Medicare /    ECOG Performance Status (current): 4  Prior ECOG: 4  Palliative Performance Scale (PPS): 10-20%  Prior PPS: 20-30%  FAST Scale in Dementia: n/a  PC Consulted ab:Rnwdlouzi by Dr. Juanna Pinal  on 9/27  for goals of care, metastatic breast cancer     Review of Systems:   Unable to obtain due to clinical circumstance      Physical Exam:  Blood pressure 139/85, pulse (!) 110, temperature 97.9 F (36.6 C), resp. rate 18, height 5' 6 (1.676 m), weight 56.7 kg (125 lb), SpO2 99 %.  Body mass index is 20.18 kg/m.  Wt Readings from Last 3 Encounters:   08/24/18 56.7 kg (125 lb)     Last Bowel Movement: Last Bowel Movement Date: 08/26/18

## 2018-08-26 NOTE — Progress Notes (Signed)
Progress Notes by [place] Rochester, RN at 08/26/18 0110                Author: [place] Rochester, RN  Service: NURSING  Author Type: Registered Nurse       Filed: 08/26/18 0114  Date of Service: 08/26/18 0110  Status: Addendum          Editor: [place] Rochester, RN (Registered Nurse)          Related Notes: Original Note by [place] Rochester, RN (Registered Nurse) filed at  08/26/18 0114               Page Sent             PAGER ID: [phone]   MESSAGE: Rm [phone] [name], Kayla,pt of Dr.Munnoz, dx AMS, metastatic breast ca, pt w/ on and off dry cough, family is requesting if she  can be given cough syrup, breathing tx given, CXR 09-30 lung mets, small bil pleural eff. [phone]   =========================   0113 DR.Morris with return call and ordered robitussin 43ml p.o. q4h prn for cough

## 2018-08-26 NOTE — Progress Notes (Signed)
[facility]  Oncology Progress Note  NAME:  [name], Kayla  SEX:   F  DOB:   [birth year]  DATE: 08/26/2018  REFERRING PHYSICIAN:    MR#    [id number]  LOCATION: RADIATION ONCOLOGY  BILLING#  [id number]    cc:    The patient has now completed 6 of 10 planned whole brain treatments.  This totals 1800 cGy.  She remains an inpatient for medical management.  Port films were checked for accuracy and treatment is to continue tomorrow as planned.      ___________________  Oliver Barre MD  Dictated By: .   SB  D:08/26/2018 65:78:46  T: 08/26/2018  [id number]

## 2018-08-26 NOTE — Progress Notes (Signed)
Transfer Out at 08/25/2018 1833     Unit: [facility] 3 STEPDOWN   Palliative care seeing pt. Updates in edc for SNF to view if family wants to proceed with SNF for rehab she will need auth when stable to dc,.

## 2018-08-26 NOTE — Progress Notes (Signed)
Hospitalist Progress Note  Kristen Loader, MD  [facility] Hospitalists    Daily Progress Note: 08/26/2018    Assessment/Plan:     Vasogenic cerebral edema??from??brain mass??  Suspected to be metastatic breast cancer  Pt has metabolic encephalopathy from this  Pt is on steroids and a 10 fraction course of XRT (ending 10/8)  Dr Joylene Draft is following  She looks better than on admission per the family  Prognosis is poor.  ??  Dehydration  Rehydrated.??    Dyspnea  Probably mostly metastatic dz as CxR did not show CHF/edema and her lungs are clear on exam.  Pt did respond to Lasix though so there could be some CHF contribution  Added Lasix 20mg  daily (10/1)  ??  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   Family is taking things day by day and want to see what options are available  Per Dr Olene Floss note on 9/27, it looks like it will be just hormonal therapy and XRT.  Onc noted prognosis is poor.  Dr Rexford Maus gave Faslodex on 9/26 and the plan is to continue injections q2 weeks??  I spoke with Dr. Aldona Bar of oncology (10/1) who noted that the patient's performance status is poor and until she can get better, and the patient can go to outpatient follow-up with Dr. Brent General, he noted that the patient would not be eligible for any sort of chemotherapy or immunotherapy at this time.    He noted that the patient is only able to tolerate hormonal therapy  ??  Hypercalcemia  S/p Bisphophonate.  Ca coming down and is with some hypocalcemia  ??  SIRS  Sepsis ruled out  ??  Severe protein calorie malnutrition??  Pt had been with poor PO, but family noted 9/30 that it is     Generalized weakness  PT/OT re-ordered  ??  Insomnia  Melatonin for sleep  ??  Code status. DNR    Disposition  Family aware pt is doing poorly, but would like to see if pt can bounce back and get strong enough to get Chemo  or Immunotherapy    ------------------    36 minutes were spent on the patient of which more than 50% was spent in coordination of care and counseling (time spent with patient/family face to face, physical exam, reviewing laboratory and imaging investigations, speaking with physicians and nursing staff involved in this patient's care)    ------------------  Physical exam:  General: Alert, NAD, pleasant.  Cooperative.   HEENT: Sclera anicteric, PERRL, OM moist, throat clear.  Chest:  CTA B, no wheeze, no rales, no rhonchi.  Good air movement.  CV: RRR, S1/S2 were normal, no murmur  Abdomen: NTND, soft, NABS, no masses were noted.  No rebound, no guarding.  Extremities: No edema.    Subjective:   3 daughters and a son at bedside. We discussed XRT ongoing.  We discussed the patient's current performance status which is quite poor at this time.  I would know that I spoke with Dr. Edd Arbour of oncology who noted that the patient's performance status is what is limiting options for the patient.  As she would not be able to tolerate additional chemo or immunotherapy in her current condition and that at this time hormonal therapy is only treatment she has.  We discussed continuing the radiation therapy for now  and then but that we would try to get PT OT to work with the patient to get her stronger so she will be able to make it to the outpatient oncology office.  Also discussed CODE STATUS which is to be DNR      Objective:     Visit Vitals  BP 144/63 (BP 1 Location: Left arm, BP Patient Position: Supine)   Pulse (!) 104   Temp 97.7 ??F (36.5 ??C)   Resp 18   Ht 5\' 6"  (1.676 m)   Wt 56.7 kg (125 lb)   SpO2 97%   BMI 20.18 kg/m??    O2 Flow Rate (L/min): 2 l/min O2 Device: Room air    Temp (24hrs), Avg:97.8 ??F (36.6 ??C), Min:97.3 ??F (36.3 ??C), Max:98.2 ??F (36.8 ??C)    10/02 0701 - 10/02 1900  In: 190 [P.O.:190]  Out: 200 [Urine:200]   09/30 1901 - 10/02 0700  In: 17 [P.O.:50]  Out: 800 [Urine:300]      Data Review:       Recent  Days:  Recent Labs     08/26/18  0646   WBC 7.1   HGB 10.1*   HCT 31.2*   PLT 203     Recent Labs     08/26/18  0646 08/25/18  0844 08/24/18  1516 08/24/18  0730   NA 141 140  --  140   K 4.0 4.3  --  3.6   CL 102 104  --  104   CO2 28 30  --  26   GLU 143* 119*  --  185*   BUN 26* 26*  --  22   CREA 0.7 0.7  --  0.8   CA 8.8 8.4*  --  9.1   MG 2.2 2.0  --   --    PHOS 2.5 3.0 1.8* 1.5*   ALB 2.4* 2.5*  --  2.7*     Recent Labs     08/24/18  0514   PH 7.483*   PCO2 32.6*   PO2 70.0*   HCO3 24.5       24 Hour Results:  Recent Results (from the past 24 hour(s))   MAGNESIUM    Collection Time: 08/26/18  6:46 AM   Result Value Ref Range    Magnesium 2.2 1.6 - 2.6 mg/dl   RENAL FUNCTION PANEL    Collection Time: 08/26/18  6:46 AM   Result Value Ref Range    Sodium 141 136 - 145 mEq/L    Potassium 4.0 3.5 - 5.1 mEq/L    Chloride 102 98 - 107 mEq/L    CO2 28 21 - 32 mEq/L    Glucose 143 (H) 74 - 106 mg/dl    BUN 26 (H) 7 - 25 mg/dl    Creatinine 0.7 0.6 - 1.3 mg/dl    GFR est AA >60.0      GFR est non-AA >60      Calcium 8.8 8.5 - 10.1 mg/dl    Albumin 2.4 (L) 3.4 - 5.0 gm/dl    Phosphorus 2.5 2.5 - 4.9 mg/dl    Anion gap 11 5 - 15 mmol/L   CBC WITH AUTOMATED DIFF    Collection Time: 08/26/18  6:46 AM   Result Value Ref Range    WBC 7.1 4.0 - 11.0 1000/mm3    RBC 3.55 (L) 3.60 - 5.20 M/uL    HGB 10.1 (L) 13.0 - 17.2 gm/dl    HCT 31.2 (L)  37.0 - 50.0 %    MCV 87.9 80.0 - 98.0 fL    MCH 28.5 25.4 - 34.6 pg    MCHC 32.4 30.0 - 36.0 gm/dl    PLATELET 203 140 - 450 1000/mm3    MPV 10.9 (H) 6.0 - 10.0 fL    RDW-SD 62.4 (H) 36.4 - 46.3      NRBC 2 (H) 0 - 0      IMMATURE GRANULOCYTES 4.5 (H) 0.0 - 3.0 %    NEUTROPHILS 85.1 (H) 34 - 64 %    LYMPHOCYTES 1.6 (L) 28 - 48 %    MONOCYTES 8.5 1 - 13 %    EOSINOPHILS 0.0 0 - 5 %    BASOPHILS 0.3 0 - 3 %   CALCIUM, IONIZED    Collection Time: 08/26/18  6:46 AM   Result Value Ref Range    CALCIUM,IONIZED 4.2 (L) 4.4 - 5.4 mg/dl       Xr Chest Sngl V    Result Date: 08/24/2018  INDICATION:  Wheezing, SOB   EXAMINATION: XR CHEST SNGL V COMPARISON: 08/17/2018 FINDINGS: Mild cardiomegaly.. Small bilateral pleural effusions. Multiple focal lung lesions bilaterally. Diffuse osteolytic bony changes..        IMPRESSION: 1. Mild cardiomegaly. 2. Small bilateral pleural effusions. 3. Multiple pulmonary nodules consistent with metastases. 4. Diffuse osteolytic lesions consistent with bony metastases.     Xr Chest Sngl V    Result Date: 08/17/2018  Indication: Short of breath Frontal view chest Heart mildly enlarged. Shallow inspiration with interstitial and patchy alveolar densities throughout the lungs. Anterior cervical fusion at 3 adjacent lower cervical levels. Degenerative narrowing and sclerosis bilateral humeral joint. This rotated to the right.     IMPRESSION: Edema versus multifocal pneumonia or chronic changes bilaterally. Cardiomegaly. Postsurgical changes cervical spine.     Ct Head Wo Cont    Result Date: 08/17/2018  DICOM format image data is available to nonaffiliated external healthcare facilities or entities on a secure, media free, reciprocally searchable basis with patient authorization for 12 months following the date of the study. Indication: Altered mental status Technique:  5 millimeter axial images without contrast were obtained through the brain. Comparison: none Findings:   There is generalized moderate cerebral atrophy.  Low attenuation surrounds the lateral ventricles. There is a rounded 16 x 14 mm mass in the right thalamus surrounding low-density edema. The mass is hyperdense peripherally and low density centrally. There is also focal low-density in the right parietal white matter suggesting vasogenic edema. Focal low-density seen more laterally in the right parietal cortex measuring 2.2 cm diameter. Apparent 2.6 cm x 2.2 cm mass is seen in the right cerebellum with surrounding low-density suggesting vasogenic edema. Asymmetric 11 mm low-density irregular lytic lesion is seen in the  diploic space of the right parietal bone. No hemorrhage seen. Sella, pineal, craniocervical junction, orbits, paranasal sinuses, and temporal bones are unremarkable.      Impression: cerebral atrophy and nonspecific white matter changes not unusual for age; multiple lesions as described in brain and calvarium worrisome for metastatic disease on this noncontrast study. Contrast MRI suggested for further evaluation.     Cta Chest W Or W Wo Cont    Result Date: 08/17/2018  DICOM format image data is available to nonaffiliated external healthcare facilities or entities on a secure, media free, reciprocally searchable basis with patient authorization for 12 months following the date of the study. Indication: Altered mental status Technique: 3 millimeter axial images with  IV contrast obtained from lung apices to bases. 3-D MIP CTA images obtained. Comparison: Frontal view chest 08/17/2018 Findings:  Mediastinum: No aortic dissection or pulmonary embolus. Calcified aortic arch and mild coronary calcifications. 10 mm low-density focus right thyroid lobe. Mild cardiomegaly. Small bilateral pleural effusions. Lungs: Numerous nodules of varying sizes throughout the lungs largest of which left lower lobe measures 18 x 19 mm. Focal pleural thickening adjacent to rib destruction right lower lobe posterolaterally. Bones/ chest wall: Extensive lytic lesions throughout the skeletal system of the thorax including sternum, spine, and ribs. High-grade compression fractures T3 and T4 with mild retropulsion. Anterior cervical fusion. Multiple masses are seen throughout the right breast the largest of which is partially imaged laterally at 6.8 x 3.5 cm; there is overlying right breast skin thickening. Asymmetric upper normal size nodes are seen in the right axilla. Below the diaphragm liver is heterogeneously low in density.     Impression: 1. No evidence of pulmonary embolus or aortic dissection. 2. Coronary artery disease with  atherosclerotic disease of aorta. 3. Indeterminate lesion right thyroid lobe which could be evaluated with ultrasound. 4. Cardiomegaly. 5. Bilateral small pleural effusions. 6. Multiple lesions throughout lungs and bones consistent with metastatic disease possibly from breast primary with asymmetric multi centric lesions throughout the right breast and overlying skin thickening with asymmetric upper normal size right axillary nodes. 7. Compression fractures T3 and T4 with retropulsion. 8. Heterogeneous liver. Metastatic disease not excluded.     Ct Abd Pelv W Cont    Result Date: 08/17/2018  DICOM format image data is available to nonaffiliated external healthcare facilities or entities on a secure, media free, reciprocally searchable basis with patient authorization for 12 months following the date of the study. TECHNIQUE: CT of the abdomen and pelvis WITH intravenous contrast.  The abdomen and pelvis were scanned utilizing a multidetector helical scanner from the diaphragm to the lesser trochanter without the oral administration of barium.  Coronal and sagittal reformations were obtained. COMPARISON: none INDICATION: Altered mental status DISCUSSION: HEPATOBILIARY: Multiple low-density ill-defined lesions throughout the liver with mild hepatomegaly. Gallbladder mildly thick-walled but otherwise unremarkable. SPLEEN: No splenomegaly or focal lesion. PANCREAS: Atrophic. ADRENALS: No adrenal nodules. KIDNEYS/URETERS: 17 mm rounded low-density right lower renal pole cystic cyst without hydronephrosis. PELVIC ORGANS/BLADDER: Uterus atrophic or removed. Extensive artifact in pelvis due to right hip replacement. PERITONEUM / RETROPERITONEUM: No free air or fluid. LYMPH NODES: No lymphadenopathy. VESSELS: Heavy calcification in nondilated aorta and iliac vessels. GI TRACT: No distention or wall thickening. No evidence of appendicitis. Large amount retained fecal material in colon and rectum. BONES AND SOFT TISSUES:  Extensive lytic destruction of the bony structures throughout spine and pelvis with left hip replacement. Multiple compression fractures of vertebral bodies moderate grade at L3 and low-grade at L2 and L4 without significant retropulsion. Small fracture right superior acetabular margin laterally.     IMPRESSION: 1. 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 Compression fractures L2 through L4. Small acute fracture suspected at superior lateral right acetabular margin. 2. Indeterminate right renal lesion likely represent cyst which could be confirmed with ultrasound. 3. Left hip replacement. 4. Uterus atrophic or removed. 5. Atherosclerotic vascular disease. 6. Constipation.       Microbiology:  All Micro Results     None           Cardiology:  Results for orders placed or performed during the hospital encounter of 08/17/18   EKG, 12 LEAD, INITIAL   Result Value  Ref Range    Ventricular Rate 120 BPM    Atrial Rate 120 BPM    P-R Interval 150 ms    QRS Duration 84 ms    Q-T Interval 384 ms    QTC Calculation (Bezet) 542 ms    Calculated P Axis 73 degrees    Calculated R Axis 0 degrees    Calculated T Axis -97 degrees    Diagnosis        Poor data quality, interpretation may be adversely affected  Sinus tachycardia  Septal infarct , age undetermined  T wave abnormality, consider inferior ischemia  T wave abnormality, consider anterolateral ischemia  Prolonged QT  When compared with ECG of 17-Aug-2018 20:03,  Septal infarct is now present  T wave inversion now evident in Inferior leads  T wave inversion now evident in Anterolateral leads  Confirmed by Marrion Coy, M.D., Ellin Saba. ([id number]) on 08/21/2018 11:43:51 AM           Problem List:  Problem List as of 08/26/2018 Never Reviewed          Codes Class Noted - Resolved    Lactic acidosis ICD-10-CM: E87.2  ICD-9-CM: 276.2  08/17/2018 - Present        Altered mental status ICD-10-CM: R41.82  ICD-9-CM: 780.97  08/17/2018 - Present        Metastasis from breast cancer  [facility]) ICD-10-CM: C79.9, C50.919  ICD-9-CM: 199.1, 174.9  08/17/2018 - Present              Medications reviewed  Current Facility-Administered Medications   Medication Dose Route Frequency   ??? levalbuterol (XOPENEX) nebulizer soln 1.25 mg/3 mL  1.25 mg Nebulization Q6H RT   ??? guaiFENesin (ROBITUSSIN) 100 mg/5 mL oral liquid 200 mg  200 mg Oral Q4H PRN   ??? furosemide (LASIX) tablet 20 mg  20 mg Oral DAILY   ??? potassium chloride SR (KLOR-CON 10) tablet 10 mEq  10 mEq Oral DAILY   ??? ibuprofen (MOTRIN) tablet 400 mg  400 mg Oral Q6H PRN   ??? nystatin (MYCOSTATIN) 100,000 unit/mL oral suspension 500,000 Units  500,000 Units Oral QID   ??? levalbuterol (XOPENEX) nebulizer soln 1.25 mg/3 mL  1.25 mg Nebulization Q4H PRN   ??? melatonin tablet 3 mg  3 mg Oral QHS   ??? haloperidol (HALDOL) tablet 1 mg  1 mg Oral QHS PRN   ??? sodium chloride (NS) flush 5-10 mL  5-10 mL IntraVENous Q8H   ??? sodium chloride (NS) flush 5-10 mL  5-10 mL IntraVENous PRN   ??? naloxone (NARCAN) injection 0.1 mg  0.1 mg IntraVENous PRN   ??? acetaminophen (TYLENOL) tablet 650 mg  650 mg Oral Q6H PRN   ??? enoxaparin (LOVENOX) injection 40 mg  40 mg SubCUTAneous Q24H   ??? influenza vaccine 2019-20 (4 yrs+)(PF) (FLUCELVAX QUAD) injection 0.5 mL  0.5 mL IntraMUSCular PRIOR TO DISCHARGE   ??? dexamethasone (DECADRON) 4 mg/mL injection 4 mg  4 mg IntraVENous Q6H         Kristen Loader, MD

## 2018-08-26 NOTE — Progress Notes (Signed)
Progress Notes by [place] Rochester, RN at 08/26/18 0259                Author: [place] Rochester, RN  Service: NURSING  Author Type: Registered Nurse       Filed: 08/26/18 0259  Date of Service: 08/26/18 0259  Status: Signed          Editor: [place] Rochester, RN (Registered Nurse)               Page Sent             PAGER ID: [phone]   MESSAGE: Rm [phone] Harmes,Karra, needs new iv line pls. thank you. daughter prefers it on upper arm. Thank you. [phone]   (115 character message out of a maximum of 240)       Close [X]     Send Another Page

## 2018-08-27 LAB — MAGNESIUM
Magnesium: 2.1 mg/dl (ref 1.6–2.6)
Magnesium: 2.1 mg/dl (ref 1.6–2.6)

## 2018-08-27 MED ORDER — CALCIUM GLUCONATE 2 GRAM/100 ML IN SODIUM CHLORIDE (ISO-OSM) IVPB
2 gram/100 mL | Freq: Once | INTRAVENOUS | Status: AC
Start: 2018-08-27 — End: 2018-08-27
  Administered 2018-08-27: 22:00:00 via INTRAVENOUS

## 2018-08-27 MED FILL — DEXAMETHASONE SODIUM PHOSPHATE 4 MG/ML IJ SOLN: 4 mg/mL | INTRAMUSCULAR | Qty: 1

## 2018-08-27 MED FILL — LEVALBUTEROL 1.25 MG/3 ML SOLN FOR INHALATION: 1.25 mg/3 mL | RESPIRATORY_TRACT | Qty: 3

## 2018-08-27 MED FILL — BD POSIFLUSH NORMAL SALINE 0.9 % INJECTION SYRINGE: INTRAMUSCULAR | Qty: 10

## 2018-08-27 MED FILL — POTASSIUM CHLORIDE SR 10 MEQ TAB: 10 mEq | ORAL | Qty: 1

## 2018-08-27 MED FILL — NYSTATIN 100,000 UNIT/ML ORAL SUSP: 100000 unit/mL | ORAL | Qty: 5

## 2018-08-27 MED FILL — FUROSEMIDE 20 MG TAB: 20 mg | ORAL | Qty: 1

## 2018-08-27 MED FILL — CALCIUM GLUCONATE 2 GRAM/100 ML IN SODIUM CHLORIDE (ISO-OSM) IVPB: 2 gram/100 mL | INTRAVENOUS | Qty: 100

## 2018-08-27 MED FILL — MELATONIN 3 MG TAB: 3 mg | ORAL | Qty: 1

## 2018-08-27 MED FILL — ENOXAPARIN 40 MG/0.4 ML SUB-Q SYRINGE: 40 mg/0.4 mL | SUBCUTANEOUS | Qty: 0.4

## 2018-08-27 MED FILL — BD POSIFLUSH NORMAL SALINE 0.9 % INJECTION SYRINGE: INTRAMUSCULAR | Qty: 30

## 2018-08-27 NOTE — Progress Notes (Signed)
Problem: Falls - Risk of  Goal: *Absence of Falls  Description  Document Kayla [name] Fall Risk and appropriate interventions in the flowsheet.  Outcome: Progressing Towards Goal  Note:   Fall Risk Interventions:  Mobility Interventions: Assess mobility with egress test, Bed/chair exit alarm    Mentation Interventions: Adequate sleep, hydration, pain control, Bed/chair exit alarm, Family/sitter at bedside    Medication Interventions: Bed/chair exit alarm    Elimination Interventions: Bed/chair exit alarm, Call light in reach    History of Falls Interventions: Bed/chair exit alarm

## 2018-08-27 NOTE — Wound Image (Signed)
Inpatient Wound / Ostomy Nurse Progress Note    Patient Name/AccountELIANA, Kayla [name] [[id number]]  Date: August 27, 2018    Situation:  Wound consult    Background:  77 year old female admitted to the hospital with AMS.  She has a history of breast cancer and recently moved to this area from [facility].      Assessment:  Right axillary indurated, discolored site measures 3 x 3 cm.  There is a full-thickness opening.  Patient states she has been putting "a cream" on it.  No drainage noted at this time.  Patient states it does drain and there is an odor present.          Recommendation:  I could recommend daily cleansing of the area.  If drainage noted, a Mepilex could be applied.  If the site becomes odorous I would recommend daily Dakin's 1/4 strength dampened gauze.      Thank you,    Allen Kell, BSN, RN, [employer], [place], Ostomy, [phone], P) [phone]

## 2018-08-27 NOTE — Progress Notes (Signed)
Laced updates in edc for SNF to view as family may still want short term rehab. Palliative care following and per notes radiation tx end 10.8.

## 2018-08-27 NOTE — Progress Notes (Addendum)
Hospitalist Progress Note  Kristen Loader, MD  [facility] Hospitalists    Daily Progress Note: 08/27/2018    Assessment/Plan:     Vasogenic cerebral edema??from??brain mass??  Suspected to be metastatic breast cancer  Pt has metabolic encephalopathy from this  Pt is on steroids and a 10 fraction course of XRT Mon-Friday (ending 10/8)  Dr Joylene Draft is following  She looks better than on admission per the family  Prognosis is poor.  ??  Dehydration  Rehydrated.??    Dyspnea  Probably mostly metastatic dz as CxR did not show CHF/edema and her lungs are clear on exam.  Pt did respond to Lasix though so there could be some CHF contribution  Added Lasix 20mg  daily (10/1)  ??  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   Family is taking things day by day and want to see what options are available  Per Dr Olene Floss note on 9/27, it looks like it will be just hormonal therapy and XRT.  Onc noted prognosis is poor.  Dr Rexford Maus gave Faslodex on 9/26 and the plan is to continue injections q2 weeks??  I spoke with Dr. Aldona Bar of oncology (10/1) who noted that the patient's performance status is poor and until she can get better, and the patient can go to outpatient follow-up with Dr. Brent General, he noted that the patient would not be eligible for any sort of chemotherapy or immunotherapy at this time.    He noted that the patient is only able to tolerate hormonal therapy  ??  Hypercalcemia  S/p Bisphophonate.  Ca coming down and is with some hypocalcemia  ??  SIRS  Sepsis ruled out  ??  Severe protein calorie malnutrition??  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  Pt was evaluated by Inpt wound care RN who noted a Right axillary indurated, discolored site measuring 3 x 3 cm.  There is a full-thickness  opening. She recommended daily cleansing of the area. If drainage noted, a Mepilex could be applied.  If the site becomes odorous they would recommend daily Dakin's 1/4 strength dampened gauze    Generalized weakness  PT/OT re-ordered  ??  Insomnia  Melatonin for sleep  ??  Acute hypoxemic respiratory failure  Transient noted on 9/30  Thought to be due to pulmonary edema as the pt responded to IV Lasix  Pt required hi flow O2 transiently  Resolved    Code status. DNR    Disposition  Family aware pt is doing poorly, but would like to see if pt can bounce back and get strong enough to get Chemo or Immunotherapy  Pt's XRT will end 10/8  Perhaps placement to SNF, but the XRT may hamper finding a facility  Will see what CM can find      ------------------    36 minutes were spent on the patient of which more than 50% was spent in coordination of care and counseling (time spent with patient/family face to face, physical exam, reviewing laboratory and imaging investigations, speaking with physicians and nursing staff involved in this patient's care)    ------------------  Physical exam:  General: Sleepy, NAD.  Cooperative.   HEENT: Sclera anicteric, PERRL, OM moist, throat clear.  Chest:  CTA  B, no wheeze, no rales, no rhonchi.  Good air movement.  CV: RRR, S1/S2 were normal, no murmur  Abdomen: NTND, soft, NABS, no masses were noted.  No rebound, no guarding.  Extremities: No edema.    Subjective:   No family at bedside. Pt drowsy. Said she was "ok", but was unable to provide additional hx or ROS      Objective:     Visit Vitals  BP 136/57 (BP 1 Location: Left arm, BP Patient Position: Supine)   Pulse 99   Temp 97.7 ??F (36.5 ??C)   Resp 18   Ht 5\' 6"  (1.676 m)   Wt 56.7 kg (125 lb)   SpO2 96%   BMI 20.18 kg/m??    O2 Flow Rate (L/min): 2 l/min O2 Device: Room air    Temp (24hrs), Avg:97.8 ??F (36.6 ??C), Min:97.3 ??F (36.3 ??C), Max:98.4 ??F (36.9 ??C)    10/03 0701 - 10/03 1900  In: 200 [P.O.:200]   Out: 400 [Urine:400]   10/01 1901 - 10/03 0700  In: 360 [P.O.:360]  Out: 6387 [Urine:1250]      Data Review:       Recent Days:  Recent Labs     08/26/18  0646   WBC 7.1   HGB 10.1*   HCT 31.2*   PLT 203     Recent Labs     08/27/18  0638 08/26/18  0646 08/25/18  0844   NA  --  141 140   K  --  4.0 4.3   CL  --  102 104   CO2  --  28 30   GLU  --  143* 119*   BUN  --  26* 26*   CREA  --  0.7 0.7   CA  --  8.8 8.4*   MG 2.1 2.2 2.0   PHOS  --  2.5 3.0   ALB  --  2.4* 2.5*     No results for input(s): PH, PCO2, PO2, HCO3, FIO2 in the last 72 hours.    24 Hour Results:  Recent Results (from the past 24 hour(s))   MAGNESIUM    Collection Time: 08/27/18  6:38 AM   Result Value Ref Range    Magnesium 2.1 1.6 - 2.6 mg/dl       Xr Chest Sngl V    Result Date: 08/24/2018  INDICATION: Wheezing, SOB   EXAMINATION: XR CHEST SNGL V COMPARISON: 08/17/2018 FINDINGS: Mild cardiomegaly.. Small bilateral pleural effusions. Multiple focal lung lesions bilaterally. Diffuse osteolytic bony changes..        IMPRESSION: 1. Mild cardiomegaly. 2. Small bilateral pleural effusions. 3. Multiple pulmonary nodules consistent with metastases. 4. Diffuse osteolytic lesions consistent with bony metastases.     Xr Chest Sngl V    Result Date: 08/17/2018  Indication: Short of breath Frontal view chest Heart mildly enlarged. Shallow inspiration with interstitial and patchy alveolar densities throughout the lungs. Anterior cervical fusion at 3 adjacent lower cervical levels. Degenerative narrowing and sclerosis bilateral humeral joint. This rotated to the right.     IMPRESSION: Edema versus multifocal pneumonia or chronic changes bilaterally. Cardiomegaly. Postsurgical changes cervical spine.     Ct Head Wo Cont    Result Date: 08/17/2018  DICOM format image data is available to nonaffiliated external healthcare facilities or entities on a secure, media free, reciprocally searchable basis with patient authorization for 12 months following the date of the  study. Indication: Altered mental status Technique:  5 millimeter axial  images without contrast were obtained through the brain. Comparison: none Findings:   There is generalized moderate cerebral atrophy.  Low attenuation surrounds the lateral ventricles. There is a rounded 16 x 14 mm mass in the right thalamus surrounding low-density edema. The mass is hyperdense peripherally and low density centrally. There is also focal low-density in the right parietal white matter suggesting vasogenic edema. Focal low-density seen more laterally in the right parietal cortex measuring 2.2 cm diameter. Apparent 2.6 cm x 2.2 cm mass is seen in the right cerebellum with surrounding low-density suggesting vasogenic edema. Asymmetric 11 mm low-density irregular lytic lesion is seen in the diploic space of the right parietal bone. No hemorrhage seen. Sella, pineal, craniocervical junction, orbits, paranasal sinuses, and temporal bones are unremarkable.      Impression: cerebral atrophy and nonspecific white matter changes not unusual for age; multiple lesions as described in brain and calvarium worrisome for metastatic disease on this noncontrast study. Contrast MRI suggested for further evaluation.     Cta Chest W Or W Wo Cont    Result Date: 08/17/2018  DICOM format image data is available to nonaffiliated external healthcare facilities or entities on a secure, media free, reciprocally searchable basis with patient authorization for 12 months following the date of the study. Indication: Altered mental status Technique: 3 millimeter axial images with IV contrast obtained from lung apices to bases. 3-D MIP CTA images obtained. Comparison: Frontal view chest 08/17/2018 Findings:  Mediastinum: No aortic dissection or pulmonary embolus. Calcified aortic arch and mild coronary calcifications. 10 mm low-density focus right thyroid lobe. Mild cardiomegaly. Small bilateral pleural effusions. Lungs:  Numerous nodules of varying sizes throughout the lungs largest of which left lower lobe measures 18 x 19 mm. Focal pleural thickening adjacent to rib destruction right lower lobe posterolaterally. Bones/ chest wall: Extensive lytic lesions throughout the skeletal system of the thorax including sternum, spine, and ribs. High-grade compression fractures T3 and T4 with mild retropulsion. Anterior cervical fusion. Multiple masses are seen throughout the right breast the largest of which is partially imaged laterally at 6.8 x 3.5 cm; there is overlying right breast skin thickening. Asymmetric upper normal size nodes are seen in the right axilla. Below the diaphragm liver is heterogeneously low in density.     Impression: 1. No evidence of pulmonary embolus or aortic dissection. 2. 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 3. Indeterminate lesion right thyroid lobe which could be evaluated with ultrasound. 4. Cardiomegaly. 5. Bilateral small pleural effusions. 6. Multiple lesions throughout lungs and bones consistent with metastatic disease possibly from breast primary with asymmetric multi centric lesions throughout the right breast and overlying skin thickening with asymmetric upper normal size right axillary nodes. 7. Compression fractures T3 and T4 with retropulsion. 8. Heterogeneous liver. Metastatic disease not excluded.     Ct Abd Pelv W Cont    Result Date: 08/17/2018  DICOM format image data is available to nonaffiliated external healthcare facilities or entities on a secure, media free, reciprocally searchable basis with patient authorization for 12 months following the date of the study. TECHNIQUE: CT of the abdomen and pelvis WITH intravenous contrast.  The abdomen and pelvis were scanned utilizing a multidetector helical scanner from the diaphragm to the lesser trochanter without the oral administration of barium.  Coronal and sagittal reformations were  obtained. COMPARISON: none INDICATION: Altered mental status DISCUSSION: HEPATOBILIARY: Multiple low-density ill-defined lesions throughout the liver with mild hepatomegaly. Gallbladder mildly thick-walled but otherwise unremarkable. SPLEEN: No splenomegaly  or focal lesion. PANCREAS: Atrophic. ADRENALS: No adrenal nodules. KIDNEYS/URETERS: 17 mm rounded low-density right lower renal pole cystic cyst without hydronephrosis. PELVIC ORGANS/BLADDER: Uterus atrophic or removed. Extensive artifact in pelvis due to right hip replacement. PERITONEUM / RETROPERITONEUM: No free air or fluid. LYMPH NODES: No lymphadenopathy. VESSELS: Heavy calcification in nondilated aorta and iliac vessels. GI TRACT: No distention or wall thickening. No evidence of appendicitis. Large amount retained fecal material in colon and rectum. BONES AND SOFT TISSUES: Extensive lytic destruction of the bony structures throughout spine and pelvis with left hip replacement. Multiple compression fractures of vertebral bodies moderate grade at L3 and low-grade at L2 and L4 without significant retropulsion. Small fracture right superior acetabular margin laterally.     IMPRESSION: 1. 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 Compression fractures L2 through L4. Small acute fracture suspected at superior lateral right acetabular margin. 2. Indeterminate right renal lesion likely represent cyst which could be confirmed with ultrasound. 3. Left hip replacement. 4. Uterus atrophic or removed. 5. Atherosclerotic vascular disease. 6. Constipation.       Microbiology:  All Micro Results     None           Cardiology:  Results for orders placed or performed during the hospital encounter of 08/17/18   EKG, 12 LEAD, INITIAL   Result Value Ref Range    Ventricular Rate 120 BPM    Atrial Rate 120 BPM    P-R Interval 150 ms    QRS Duration 84 ms    Q-T Interval 384 ms    QTC Calculation (Bezet) 542 ms    Calculated P Axis 73 degrees     Calculated R Axis 0 degrees    Calculated T Axis -97 degrees    Diagnosis        Poor data quality, interpretation may be adversely affected  Sinus tachycardia  Septal infarct , age undetermined  T wave abnormality, consider inferior ischemia  T wave abnormality, consider anterolateral ischemia  Prolonged QT  When compared with ECG of 17-Aug-2018 20:03,  Septal infarct is now present  T wave inversion now evident in Inferior leads  T wave inversion now evident in Anterolateral leads  Confirmed by Marrion Coy, M.D., Ellin Saba. ([id number]) on 08/21/2018 11:43:51 AM           Problem List:  Problem List as of 08/27/2018 Never Reviewed          Codes Class Noted - Resolved    Lactic acidosis ICD-10-CM: E87.2  ICD-9-CM: 276.2  08/17/2018 - Present        Altered mental status ICD-10-CM: R41.82  ICD-9-CM: 780.97  08/17/2018 - Present        Metastasis from breast cancer [facility]) ICD-10-CM: C79.9, C50.919  ICD-9-CM: 199.1, 174.9  08/17/2018 - Present              Medications reviewed  Current Facility-Administered Medications   Medication Dose Route Frequency   ??? levalbuterol (XOPENEX) nebulizer soln 1.25 mg/3 mL  1.25 mg Nebulization Q6H RT   ??? guaiFENesin (ROBITUSSIN) 100 mg/5 mL oral liquid 200 mg  200 mg Oral Q4H PRN   ??? furosemide (LASIX) tablet 20 mg  20 mg Oral DAILY   ??? potassium chloride SR (KLOR-CON 10) tablet 10 mEq  10 mEq Oral DAILY   ??? ibuprofen (MOTRIN) tablet 400 mg  400 mg Oral Q6H PRN   ??? nystatin (MYCOSTATIN) 100,000 unit/mL oral suspension 500,000 Units  500,000 Units Oral QID   ???  levalbuterol (XOPENEX) nebulizer soln 1.25 mg/3 mL  1.25 mg Nebulization Q4H PRN   ??? melatonin tablet 3 mg  3 mg Oral QHS   ??? haloperidol (HALDOL) tablet 1 mg  1 mg Oral QHS PRN   ??? sodium chloride (NS) flush 5-10 mL  5-10 mL IntraVENous Q8H   ??? sodium chloride (NS) flush 5-10 mL  5-10 mL IntraVENous PRN   ??? naloxone (NARCAN) injection 0.1 mg  0.1 mg IntraVENous PRN   ??? acetaminophen (TYLENOL) tablet 650 mg  650 mg Oral Q6H PRN    ??? enoxaparin (LOVENOX) injection 40 mg  40 mg SubCUTAneous Q24H   ??? influenza vaccine 2019-20 (4 yrs+)(PF) (FLUCELVAX QUAD) injection 0.5 mL  0.5 mL IntraMUSCular PRIOR TO DISCHARGE   ??? dexamethasone (DECADRON) 4 mg/mL injection 4 mg  4 mg IntraVENous Q6H         Kristen Loader, MD

## 2018-08-27 NOTE — Other (Signed)
Bedside and Verbal shift change report given to Carmon Ginsberg, RN (Soil scientist) by Shan Levans, RN (offgoing nurse). Report included the following information SBAR and Kardex.

## 2018-08-27 NOTE — Progress Notes (Signed)
Adult Malnutrition Guidelines:  SEVERE PROTEIN CALORIE MALNUTRITION IN THE CONTEXT OF ACUTE INJURY/ILLNESS  Weight loss:??14% x??2??months  Energy intake: <50% energy intake compared to estimated energy needs >1 month  Body fat: severe depletion  Muscle mass: severe depletion??  Other considerations: breast CA stage 4, brain mass, mets to liver  ??  Please document??Severe??Protein Calorie Malnutrition??in Problem List if in agreement    Recommend initiate alternative nutrition if PO intake continues to be low. Rec. Jev 1.5 at 82m/hr  Provides 1800kcals, 77 g protein and 1632 ml water    NUTRITION FOLLOW UP    Current diet order: DIET NUTRITIONAL SUPPLEMENTS Lunch, Dinner; Ensure [employer] DIET NUTRITIONAL SUPPLEMENTS Dinner; [employer], dispensing DIET MECHANICAL SOFT    Vitamin/mineral supplementation: NS, potassium chloride    Other pertinent meds: Lasix     Current intake: [] N/A- NPO    [] very poor     [x] poor      [] fair      [] good  % Diet Eaten  Avg: 43 %  Min: 25 %  Max: 60 %  Spoke to Care partner who reports intake minimal.     Weight:   Last 3 Recorded Weights in this Encounter    08/17/18 1829 08/18/18 0520 08/24/18 0647   Weight: 55.3 kg (122 lb) 57.5 kg (126 lb 12.2 oz) 56.7 kg (125 lb)       BMI: Body mass index is 20.18 kg/m??.    Physical assessment:  ?? Chewing/swallowing issues: Dentures  ?? GI symptoms:    Last Bowel Movement Date: 08/27/18   Stool Appearance: Soft   Abdominal Assessment: Soft   Bowel Sounds: Active   ?? Fluid retention:   Edema  LLE: Trace  RLE: Trace  ?? Skin integrity: Intact     Biochemical data: Reviewed    Assessment of current MNT: Diet appropriate, adequate to meet nutrition needs if po >75% with meals. Supplements to increase calorie/pro intake opportunity     Nutrition diagnosis:   Severe acute protein calorie malnutrition??related to breast CA stage IV, brain mass, mets to liver noted??as evidenced by??po <50% x > 1 month, wt loss of 14% x 2 months, mod/severe physical wasting (continues)      Nutrition recommendation: Continue current MNT   Recommend initiate alternative nutrition if PO intake continues to be low. Rec. Jev 1.5 at 57mhr  Provides 1800kcals, 77 g protein and 1632 ml water  ??  Nutrition monitoring:??PO intake, diet tolerance and compliance, wt, BS, CMP, hydration, medical changes  ??  Nutrition goals:??PO >75% with meals/supplements, wt stable through LOS, nutrition related lab values WNL??    Progress towards nutrition goals: []  Met/Ongoing     []  Progressing appropriately       []  Progressing slowly      []  Not Progressing    Nutrition level of care:  [] low     [x] moderate     [] high    Code status: DNR      Discharge Placement: Unable to determine at this time(anticipate SNF for short term rehab palliative care seeing pt)    RaAlton RevereRDChalmers10/03/19

## 2018-08-27 NOTE — Progress Notes (Signed)
OT goals initiated 08/27/2018 and will be met by patient x2 weeks:    - patient will complete bed mobility with Sup and similar to home s/u to increase OOB ADL participation  - patient will complete all functional t/f's with use of LRAD and Sup to increase indep with toileting using BSC and progressing to use of toilet in bathroom   - patient will complete UB bathing and dressing seated EOB with good balance and no UE support  - patient will complete LB bathing and dressing with min A and use of AE PRN and LRAD to reduce caregiver burden   - patient will complete all toileting tasks with Min A and use of DME PRN and LRAD   - patient will tolerate standing x11mns sink side to complete grooming and bathing tasks with CGA and use of LRAD to reduce caregiver burden   - patient will increase BUE strength from 4+ to 5 to increase indep with ADLs and t/f's.   - patient will demo good safety awareness with use of LRAD during self care tasks to reduce risk of falls     OCCUPATIONAL THERAPY EVALUATION     Patient: Kayla [name]((77y.o. female)  Room: 5117/5117    Date: 08/27/2018  Start Time:  1120        End Time:  1150    Primary Diagnosis: Altered mental status [R41.82]  Lactic acidosis [E87.2]  [place] [C79.9, C50.919]         Precautions: falls, cognitive deficits   Ordered Weight Bearing Status: None    Isolation:  There are currently no Active Isolations       MDRO: No active infections     ASSESSMENT :  Based on the objective data described below, the patient presents with   - generalized muscle weakness affecting function in ADLs  - bilateral, upper extremity, lower extremity weakness  - decreased sitting and standing balance    - decreased functional mobility   - decreased activity tolerance, increased fatigue and/or shortness of breathe   - decreased tolerance to sustained activity  - increased pain   - decreased cognition   - decreased flexibility   - decreased ability to do cross leg technique for lower body dressing  affecting patient's safety and independence/ability to perform basic ADLs/IADLs    Patient will benefit from skilled occupational therapy intervention to address the above impairments.     Patient???s rehabilitation potential is considered to be Fair d/t medical complexities     PLAN :  Planned Interventions: Adaptive equipment, ADI training, activity tolerance, functional balance training, functional mobility training, therapeutic exercise, therapeutic activity, patient/caregiver education and training, home exercise program, neuromuscular re-education, energy conservation and cognitive training.  Frequency/Duration: Patient will be followed by occupational therapy 1-5x/week x 2 weeks to address goals.  Discharge Recommendations: SNF.  Further Equipment Recommendations for Discharge: to be determined     Education / communication:     Barriers to learning/limitations:  yes;  cognitive, sensory deficits-vision/hearing/speech and altered mental status (i.e.Sedation, Confusion)  Education provided to: patient on (+) role of OT, (+) OT plan of care, (+) instructed patient on the importance of activity while hospitalized to prevent a decline in function, (+) encouraged patient to sit up in chair for 45 (+) minutes or as tolerated 2-3 times a day, with staff assistance as needed, (+) the importance of maintaining UE muscle strength and activity tolerance while hospitalized to prevent a decline in function, (+)  staff assistance with mobility, (+) change positions frequently, (+) safety, (+) functional mobility, Edu pt dtr on functional abilities today during session   Educational handouts issued: none this session  Patient / family response to education: verbalized understanding, demonstrated understanding, showing interest and trying to perform skills    Patient and/or family have participated as able in goal setting and plan of care.  SUBJECTIVE      Patient "I fell at the grocery store" and "couldn't get up".    OBJECTIVE DATA SUMMARY   Orders, labs, and chart reviewed on [facility]. Discussed with Wannetta Sender, RN.    Patient was admitted to the hospital on 08/17/2018 with   Chief Complaint   Patient presents with   ??? Altered mental status     Present illness history:   Patient Active Problem List    Diagnosis Date Noted   ??? Lactic acidosis 08/17/2018   ??? Altered mental status 08/17/2018   ??? 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 Back pain    ??? Breast cancer ([place])        Patient found: bed, bed alarm, avasys, purewick.      Pain Assessment: patient denies pain during entire session and asked multiple times during session     Prior level of function / living situation:     Information was obtained by:   patient, questionable historian  Home environment:   lives with dtr in a reported 3 story home with bedroom on 3rd floor.      ?? Assistance available following hospital stay:  Yes: dtr who is a Physiological scientist and pt reports that she is home most of the time but sometimes on her own at home   Prior level of function:reports that she was able to ambulate with walker indep and completed toileting indep. UB ADLs indep and LB ADLs with assistance from dtr. 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 she also reports that she does assist with cleaning including washing dishes, vacuuming, bed making, etc   Home equipment: walker    Cognitive status:     Mental status:   Orientation: Patient only aware of time, place and person but variable and not consistent with report. Needs min v/c's for month and year, Neurostate: awake, alert, pleasant, confused and VERY COOPERATIVE  and PLEASANT   Communication: impaired, soft spoken, limited verbalization  Attention Span:   fair(15-347m)  Follows commands: able to follow simple 1 step commands  General cognition: slow processing  Safety/Judgement: decreased awareness of need for assistance  Hearing:   grossly intact  Vision:   grossly intact    Activities of daily living status:     Based on direct observation, simulation and clinical assessment.    Eating:           - simulated and s/u, likely very slow   Grooming:     - s/u and Sup seated EOB simulated   UB bathing:   - Min A  LB bathing:   - maximum assistance  UB dressing: - minimum assistance  LB dressing: - maximum assistance  Toileting:       - maximum  assistance    Functional mobility and balance status:     Mobility:  Supine to sit -  minimum assistance  Sit to Stand -  minimum assistance  Stand to Sit -  minimum assistance    Transfers:  Bed to bedside chair: minimum assistance, use of bedrails and max v/c's for hand placement for safety. patient is very cautious and safe during t/f.     Comment(s):   - good safety with STS and t/f.     Functional balance status:     Balance:  Static Sitting Balance -           good:       maintains balance against moderate resistance  Dynamic Sitting Balance -      good (-):  independent with basic dynamic balance activities , reaching out of BOS EOB   Static Standing Balance -       poor (+):  minimum assistance to maintain balance  Dynamic Standing Balance -  poor (+):  requires minimum assistance to right themselves/maintain standing     Activity Tolerance: fair    Upper extremity status:     Dominance:right  RIGHT:  ACTIVE range of motion is [facility].  Strength is grossly graded as 4+/5: (Completes full range of motion against gravity with moderate to maximal resistance).    LEFT:    ACTIVE range of motion is [facility].  Strength is grossly graded as 4+/5: (Completes full range of motion against gravity with moderate to maximal resistance).       Final position:   Seated in bed side chair, all needs within reach, agrees to call for assistance, (+) bed/chair alarm, BLE elevated, nursing staff notified, family present    Evaluation Complexity: History: HIGH Complexity : Extensive review of history including physical, cognitive and psychosocial history ; Examination: MEDIUM Complexity : 3-5 performance deficits relating to physical, cognitive , or psychosocial skils that result in activity limitations and / or participation restrictions; Decision Making:MEDIUM Complexity : Patient may present with comorbidities that affect occupational performnce. Miniml to moderate modification of tasks or assistance (eg, physical or verbal ) with assesment(s) is necessary to enable patient to complete evaluation      Thank you for this referral.  Alyson T Sipple, OTR/L    TREATMENT     Patient received / participated in 15 minutes of treatment and/or educational instruction during/immediately following OT evaluation.      OBJECTIVE: patient completed supine to sit with Min A and tolerated sitting EOB x38mns. Static with good balance with and without UE support on bed rails. Good- balance sitting reaching out of BOS with unilateral UE support on bed rail to maintain balance. Completed STS initially with min A and use of bed rails and tolerated 242ms in standing static and dynamic including in place marches to prep for t/f to the bedside chair. Second STS from bed pushing from bed with min A and SPT from bed to bedside chair with min A and max v/c's for hand placement and safety, patient very cautious and safe to complete t/f to bedside chair. Patient very pleasant throughout session and very easy to work with, motivated to participate.     ASSESSMENT: patient will continue to benefit from skilled OT services to increase overall strength, activity tolerance, standing balance and tolerance, t/f abilities and mobility, safety awareness and overall self  care indep to increase safety and indep to return home with dtr as close to PLOF as possible.  PLAN: Continue OT POC      Alyson T Sipple, OTR/L

## 2018-08-27 NOTE — Other (Deleted)
PHYSICIAN???S DOCUMENTATION REQUEST   This Form is Not a Permanent Document in the Medical Record     By submitting this query, we are merely seeking further clarification of documentation to accurately reflect all conditions that you are monitoring, evaluating, treating or that extend the hospitalization or utilize additional resources of care. Please utilize your independent clinical judgment when addressing the question(s) below.    Dear Dr. Nira Retort,      A consultant documented the following information with no mention of this diagnosis in the Attending Physician???s documentation.      Documentation by Hospitalist on 08/24/09 states: Acute hypoxic respiratory failure. Acute respiratory distress. Patient with tachypnea, tachycardia and extensive wheezing throughout the day.   ABG with low PO2. Start high flow, Lasix IV holding IV Fluids. Add duo nebs . Trend WBC and fever curve. 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     Documentation by Hospitalist on 08/26/18 states: Dyspnea probably  Mostly metastatic disease as CXR did not show CHF/edema and her lungs are clear on exam. Pt did respond to Lasix so could be some CHF contribution. Added Lasix Daily 08/25/18    ABG 08/24/18   pH 7.483  PCO2  32.6  PO2 70.0  Bicarb 24.5  O2 SAT 96-100  On room air    08/24/18  Heart rate 101-118  Respiratory rate 13-27  O 2 sat    Treatment given:  2 L NC, Lasix,     ---------------------------------------------------------------------------------------------------------------  As the Attending Physician for this patient, please clarify if the diagnosis of Acute hypoxic respiratory failure is:    ? Ruled in and treating/managing  ? Ruled in and resolved  ? Ruled out   ? Clinically Undetermined      PLEASE DOCUMENT ANY ADDITIONAL DIAGNOSES AND/OR SPECIFICITY IN THE PROGRESS NOTES AND/OR DISCHARGE SUMMARY.    Thank you,    Ladell Heads, RN, BSN, [place]   Clinical Documentation Specialist   [facility]   Office: [phone]  Cell: [phone]

## 2018-08-27 NOTE — Progress Notes (Signed)
Palliative Care RN Progress Note  Palliative Care Team is available Monday-Friday 8 AM-4:30 PM       NAME:  Kayla [name]   DOB:   [birth year]   MRN:   [id number]     Date/Time Patient Seen:  08/27/2018 10:14 AM    Attending Physician: Kristen Loader, MD  Primary Care Physician: Henrene Pastor, MD        Palliative Assessment/Plan:     Code Status:  DNR (confirmed by children today)  Goals of Care: Continue current treatment, children understand patients overall prognosis is poor and her only option at this time is to work on increasing her strength/conditiong to potentially be able to tolerate chemotherapy in the next few weeks.  In the meantime they want to focus on PT/OT and increasing her appetite/nutrition.  They also confirmed wanting to change her code status to DNR.  10/3 No changes to Kayla [name], patient awake and no complaints, encouraged eating and working with PT today    Long meeting via conference call with Dr. Rexford Maus and 3/4 of the children, greatly appreciate Dr. Rexford Maus for participating and answering all of their questions.      Disposition: TBD, potential rehab upon discharge unless patient declines any further and would need comfort/hospice    Current Capacity for Decision Making:  Awake, but lethargic and confused, not decisional      Advance Care Planning:      Advance Directives:          Medical Power of Attorney: none          Living Will: none          POLST forms:  There is no POLST (Physician orders for life-sustaining treatment) form on file for this patient     No Advance Directive: Surrogate Decision Maker: Kayla [name] (daughter) [phone]; Kayla [name] (daughter) [phone]; Kayla [name] (daughter) [phone]; Kayla [name] (son) [phone]          The following were updated:  Discussed with Attending Physician/Provider: Kristen Loader, MD   Other Physician (consultant): reviewed chart notes   Nursing: RN  Care Manager/Discharge Planner: n/a today      Kayla August RN, BSN, [facility]   Palliative Medicine  [phone] office      The Palliative Medicine team is available Monday to Friday from 8am to 4:30pm at [phone]    Interval History:       HPI:      HPI: Pt is 77 year old female with breast CA followed Dr. Brent General in [place] undergoing systemic hormal therapy with faslodex. Admitted on 08/17/18 for AMS; head/abd/pelv/chest CT in ED worrisome for brain with vasogenic edemia, lung, liver mets; pt undergoing palliative WBR but unable to tolerate lying flat and then later declined to complete simulation mapping to lumbar spine fields per Dr. Callie Fielding; planning for x1 dose of faslodex but overall poor prognosis per [facility] Dr. Rexford Maus. Pertinent comorbidities include HTN.  PTA medications per EMR: Lisinopril, fentanyl TD 14mcg/hr, oxycodone IR 10mg  PO Q4hr PRN.  Social History:   Religious/Cultural/Spiritual: CHRISTIAN  Insurance:  Payor: Psychologist, prison and probation services / Plan: [place] / Product Type: Managed Care Medicare /    ECOG Performance Status (current): 4  Prior ECOG: 4  Palliative Performance Scale (PPS): 10-20%  Prior PPS: 20-30%  FAST Scale in Dementia: n/a  PC Consulted AC:[id number] by Dr. Nechama Guard  on 9/27  for goals of care, metastatic breast cancer     Review of Systems:  Unable to obtain due to clinical circumstance      Physical Exam:   Blood pressure 152/80, pulse (!) 101, temperature 97.4 ??F (36.3 ??C), resp. rate 18, height 5\' 6"  (1.676 m), weight 56.7 kg (125 lb), SpO2 100 %.  Body mass index is 20.18 kg/m??.  Wt Readings from Last 3 Encounters:   08/24/18 56.7 kg (125 lb)     Last Bowel Movement: Last Bowel Movement Date: 08/27/18

## 2018-08-27 NOTE — Progress Notes (Signed)
 Palliative Care RN Progress Note  Palliative Care Team is available Monday-Friday 8 AM-4:30 PM       NAME:  Kayla [name]   DOB:   Dec 14, 1940   MRN:   [id number]     Date/Time Patient Seen:  08/27/2018 10:14 AM    Attending Physician: Lorriane Oliva BRAVO, MD  Primary Care Physician: Danso, Michael A, MD        Palliative Assessment/Plan:     Code Status:  DNR (confirmed by children today)  Goals of Care: Continue current treatment, children understand patients overall prognosis is poor and her only option at this time is to work on increasing her strength/conditiong to potentially be able to tolerate chemotherapy in the next few weeks.  In the meantime they want to focus on PT/OT and increasing her appetite/nutrition.  They also confirmed wanting to change her code status to DNR.  10/3 No changes to GOC, patient awake and no complaints, encouraged eating and working with PT today    Long meeting via conference call with Dr. Linnette and 3/4 of the children, greatly appreciate Dr. Linnette for participating and answering all of their questions.      Disposition: TBD, potential rehab upon discharge unless patient declines any further and would need comfort/hospice    Current Capacity for Decision Making:  Awake, but lethargic and confused, not decisional      Advance Care Planning:      Advance Directives:          Medical Power of Attorney: none          Living Will: none          POLST forms:  There is no POLST (Physician orders for life-sustaining treatment) form on file for this patient     No Advance Directive: Surrogate Decision Maker: Kayla [name] (daughter) [phone]; Kayla [name] (daughter) [phone]; Kayla [name] (daughter) [phone]; Kayla [name] (son) [phone]          The following were updated:  Discussed with Attending Physician/Provider: Lorriane Oliva BRAVO, MD   Other Physician (consultant): reviewed chart notes   Nursing: RN  Care Manager/Discharge Planner: n/a today      Ronnald Brain RN, BSN, [facility]  Palliative  Medicine  [phone] office      The Palliative Medicine team is available Monday to Friday from 8am to 4:30pm at [phone]    Interval History:       HPI:      HPI: Pt is 77 year old female with breast CA followed Dr. Danso in [place] undergoing systemic hormal therapy with faslodex. Admitted on 08/17/18 for AMS; head/abd/pelv/chest CT in ED worrisome for brain with vasogenic edemia, lung, liver mets; pt undergoing palliative WBR but unable to tolerate lying flat and then later declined to complete simulation mapping to lumbar spine fields per Dr. Marge; planning for x1 dose of faslodex but overall poor prognosis per [facility] Dr. Linnette. Pertinent comorbidities include HTN.  PTA medications per EMR: Lisinopril, fentanyl TD 50mcg/hr, oxycodone IR 10mg  PO Q4hr PRN.  Social History:   Religious/Cultural/Spiritual: CHRISTIAN  Insurance:  Payor: [employer] / Plan: [facility] [employer] MEDICARE / Product Type: Managed Care Medicare /    ECOG Performance Status (current): 4  Prior ECOG: 4  Palliative Performance Scale (PPS): 10-20%  Prior PPS: 20-30%  FAST Scale in Dementia: n/a  PC Consulted ab:Rnwdlouzi by Dr. Juanna Pinal  on 9/27  for goals of care, metastatic breast cancer     Review of Systems:  Unable to obtain due to clinical circumstance      Physical Exam:   Blood pressure 152/80, pulse (!) 101, temperature 97.4 F (36.3 C), resp. rate 18, height 5' 6 (1.676 m), weight 56.7 kg (125 lb), SpO2 100 %.  Body mass index is 20.18 kg/m.  Wt Readings from Last 3 Encounters:   08/24/18 56.7 kg (125 lb)     Last Bowel Movement: Last Bowel Movement Date: 08/27/18

## 2018-08-27 NOTE — Progress Notes (Signed)
 OT goals initiated 08/27/2018 and will be met by patient x2 weeks:    - patient will complete bed mobility with Sup and similar to home s/u to increase OOB ADL participation  - patient will complete all functional t/f's with use of LRAD and Sup to increase indep with toileting using BSC and progressing to use of toilet in bathroom   - patient will complete UB bathing and dressing seated EOB with good balance and no UE support  - patient will complete LB bathing and dressing with min A and use of AE PRN and LRAD to reduce caregiver burden   - patient will complete all toileting tasks with Min A and use of DME PRN and LRAD   - patient will tolerate standing x47mins sink side to complete grooming and bathing tasks with CGA and use of LRAD to reduce caregiver burden   - patient will increase BUE strength from 4+ to 5 to increase indep with ADLs and t/f's.   - patient will demo good safety awareness with use of LRAD during self care tasks to reduce risk of falls     OCCUPATIONAL THERAPY EVALUATION     Patient: Kayla [name] (77 y.o. 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]         Precautions: falls, cognitive deficits   Ordered Weight Bearing Status: None    Isolation:  There are currently no Active Isolations       MDRO: No active infections     ASSESSMENT :  Based on the objective data described below, the patient presents with   - generalized muscle weakness affecting function in ADLs  - bilateral, upper extremity, lower extremity weakness  - decreased sitting and standing balance    - decreased functional mobility   - decreased activity tolerance, increased fatigue and/or shortness of breathe   - decreased tolerance to sustained activity  - increased pain   - decreased cognition   - decreased flexibility  - decreased ability to do cross leg  technique for lower body dressing  affecting patient's safety and independence/ability to perform basic ADLs/IADLs    Patient will benefit from skilled occupational therapy intervention to address the above impairments.     Patient's rehabilitation potential is considered to be Fair d/t medical complexities     PLAN :  Planned Interventions: Adaptive equipment, ADI training, activity tolerance, functional balance training, functional mobility training, therapeutic exercise, therapeutic activity, patient/caregiver education and training, home exercise program, neuromuscular re-education, energy conservation and cognitive training.  Frequency/Duration: Patient will be followed by occupational therapy 1-5x/week x 2 weeks to address goals.  Discharge Recommendations: SNF.  Further Equipment Recommendations for Discharge: to be determined     Education / communication:     Barriers to learning/limitations:  yes;  cognitive, sensory deficits-vision/hearing/speech and altered mental status (i.e.Sedation, Confusion)  Education provided to: patient on (+) role of OT, (+) OT plan of care, (+) instructed patient on the importance of activity while hospitalized to prevent a decline in function, (+) encouraged patient to sit up in chair for 45 (+) minutes or as tolerated 2-3 times a day, with staff assistance as needed, (+) the importance of maintaining UE muscle strength and activity tolerance while hospitalized to prevent a decline in function, (+)  staff assistance with mobility, (+) change positions frequently, (+) safety, (+) functional mobility, Edu pt dtr on functional abilities today during session   Educational handouts issued: none this session  Patient / family response to education: verbalized understanding, demonstrated understanding, showing interest and trying to perform skills    Patient and/or family have participated as able in goal setting and plan of care.  SUBJECTIVE     Patient I fell at the grocery store  and couldn't get up.    OBJECTIVE DATA SUMMARY   Orders, labs, and chart reviewed on [facility]. Discussed with Carley, RN.    Patient was admitted to the hospital on 08/17/2018 with   Chief Complaint   Patient presents with   . Altered mental status     Present illness history:   Patient Active Problem List    Diagnosis Date Noted   . Lactic acidosis 08/17/2018   . Altered mental status 08/17/2018   . 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 Back pain    . Breast cancer [facility])        Patient found: bed, bed alarm, avasys, purewick.      Pain Assessment: patient denies pain during entire session and asked multiple times during session     Prior level of function / living situation:     Information was obtained by:   patient, questionable historian  Home environment:   lives with dtr in a reported 3 story home with bedroom on 3rd floor.       Assistance available following hospital stay:  Yes: dtr who is a [employer] cabin crew and pt reports that she is home most of the time but sometimes on her own at home   Prior level of function:reports that she was able to ambulate with walker indep and completed toileting indep. UB ADLs indep and LB ADLs with assistance from dtr. reports 2 falls in the last 46mo.   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 she also reports that she does assist with cleaning including washing dishes, vacuuming, bed making, etc   Home equipment: walker    Cognitive status:     Mental status:   Orientation: Patient only aware of time, place and person but variable and not consistent with report. Needs min v/c's for month and year, Neurostate: awake, alert, pleasant, confused and VERY COOPERATIVE and PLEASANT   Communication: impaired,  soft spoken, limited verbalization  Attention Span:   fair(15-75min)  Follows commands: able to follow simple 1 step commands  General cognition: slow processing  Safety/Judgement: decreased awareness of need for assistance  Hearing:   grossly intact  Vision:   grossly intact    Activities of daily living status:     Based on direct observation, simulation and clinical assessment.    Eating:           - simulated and s/u, likely very slow   Grooming:     - s/u and Sup seated EOB simulated   UB bathing:   - Min A  LB bathing:   - maximum assistance  UB dressing: - minimum assistance  LB dressing: - maximum assistance  Toileting:       - maximum  assistance    Functional mobility and balance status:     Mobility:  Supine to sit -  minimum assistance  Sit to Stand -  minimum assistance  Stand to Sit -  minimum assistance    Transfers:  Bed to bedside chair: minimum assistance, use of bedrails and max v/c's for hand placement for safety. patient is very cautious and safe during t/f.     Comment(s):   - good safety with STS and t/f.     Functional balance status:     Balance:  Static Sitting Balance -           good:       maintains balance against moderate resistance  Dynamic Sitting Balance -      good (-):  independent with basic dynamic balance activities , reaching out of BOS EOB   Static Standing Balance -       poor (+):  minimum assistance to maintain balance  Dynamic Standing Balance -  poor (+):  requires minimum assistance to right themselves/maintain standing     Activity Tolerance: fair    Upper extremity status:     Dominance:right  RIGHT:  ACTIVE range of motion is [facility].  Strength is grossly graded as 4+/5: (Completes full range of motion against gravity with moderate to maximal resistance).    LEFT:    ACTIVE range of motion is [facility].  Strength is grossly graded as 4+/5: (Completes full range of motion against gravity with moderate to maximal resistance).      Final position:   Seated in bed side chair, all  needs within reach, agrees to call for assistance, (+) bed/chair alarm, BLE elevated, nursing staff notified, family present    Evaluation Complexity: History: HIGH Complexity : Extensive review of history including physical, cognitive and psychosocial history ; Examination: MEDIUM Complexity : 3-5 performance deficits relating to physical, cognitive , or psychosocial skils that result in activity limitations and / or participation restrictions; Decision Making:MEDIUM Complexity : Patient may present with comorbidities that affect occupational performnce. Miniml to moderate modification of tasks or assistance (eg, physical or verbal ) with assesment(s) is necessary to enable patient to complete evaluation      Thank you for this referral.  Alyson T Sipple, OTR/L    TREATMENT     Patient received / participated in 15 minutes of treatment and/or educational instruction during/immediately following OT evaluation.      OBJECTIVE: patient completed supine to sit with Min A and tolerated sitting EOB x76mins. Static with good balance with and without UE support on bed rails. Good- balance sitting reaching out of BOS with unilateral UE support on bed rail to maintain balance. Completed STS initially with min A and use of bed rails and tolerated in standing static and dynamic including in place marches to prep for t/f to the bedside chair. Second STS from bed pushing from bed with min A and SPT from bed to bedside chair with min A and max v/c's for hand placement and safety, patient very cautious and safe to complete t/f to bedside chair. Patient very pleasant throughout session and very easy to work with, motivated to participate.     ASSESSMENT: patient will continue to benefit from skilled OT services to increase overall strength, activity tolerance, standing balance and tolerance, t/f abilities and mobility, safety awareness and overall self care indep to increase safety and indep to return home with dtr as close  to PLOF as possible.  PLAN: Continue OT POC      Alyson T Sipple, OTR/L

## 2018-08-27 NOTE — Progress Notes (Signed)
Hospitalist Progress Note  Kristen Loader, MD  [facility] Hospitalists    Daily Progress Note: 08/27/2018    Assessment/Plan:     Vasogenic cerebral edema??from??brain mass??  Suspected to be metastatic breast cancer  Pt has metabolic encephalopathy from this  Pt is on steroids and a 10 fraction course of XRT Mon-Friday (ending 10/8)  Dr Joylene Draft is following  She looks better than on admission per the family  Prognosis is poor.  ??  Dehydration  Rehydrated.??    Dyspnea  Probably mostly metastatic dz as CxR did not show CHF/edema and her lungs are clear on exam.  Pt did respond to Lasix though so there could be some CHF contribution  Added Lasix 20mg  daily (10/1)  ??  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   Family is taking things day by day and want to see what options are available  Per Dr Olene Floss note on 9/27, it looks like it will be just hormonal therapy and XRT.  Onc noted prognosis is poor.  Dr Rexford Maus gave Faslodex on 9/26 and the plan is to continue injections q2 weeks??  I spoke with Dr. Aldona Bar of oncology (10/1) who noted that the patient's performance status is poor and until she can get better, and the patient can go to outpatient follow-up with Dr. Brent General, he noted that the patient would not be eligible for any sort of chemotherapy or immunotherapy at this time.    He noted that the patient is only able to tolerate hormonal therapy  ??  Hypercalcemia  S/p Bisphophonate.  Ca coming down and is with some hypocalcemia  ??  SIRS  Sepsis ruled out  ??  Severe protein calorie malnutrition??  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  Pt was evaluated by Inpt wound care RN who noted a Right axillary indurated, discolored site measuring 3 x 3 cm.  There is a full-thickness  opening. She recommended daily cleansing of the area. If drainage noted, a Mepilex could be applied.  If the site becomes odorous they would recommend daily Dakin's 1/4 strength dampened gauze    Generalized weakness  PT/OT re-ordered  ??  Insomnia  Melatonin for sleep  ??  Acute hypoxemic respiratory failure  Transient noted on 9/30  Thought to be due to pulmonary edema as the pt responded to IV Lasix  Pt required hi flow O2 transiently  Resolved    Code status. DNR    Disposition  Family aware pt is doing poorly, but would like to see if pt can bounce back and get strong enough to get Chemo or Immunotherapy  Pt's XRT will end 10/8  Perhaps placement to SNF, but the XRT may hamper finding a facility  Will see what CM can find      ------------------    36 minutes were spent on the patient of which more than 50% was spent in coordination of care and counseling (time spent with patient/family face to face, physical exam, reviewing laboratory and imaging investigations, speaking with physicians and nursing staff involved in this patient's care)    ------------------  Physical exam:  General: Sleepy, NAD.  Cooperative.   HEENT: Sclera anicteric, PERRL, OM moist, throat clear.  Chest:  CTA  B, no wheeze, no rales, no rhonchi.  Good air movement.  CV: RRR, S1/S2 were normal, no murmur  Abdomen: NTND, soft, NABS, no masses were noted.  No rebound, no guarding.  Extremities: No edema.    Subjective:   No family at bedside. Pt drowsy. Said she was "ok", but was unable to provide additional hx or ROS      Objective:     Visit Vitals  BP 136/57 (BP 1 Location: Left arm, BP Patient Position: Supine)   Pulse 99   Temp 97.7 ??F (36.5 ??C)   Resp 18   Ht 5\' 6"  (1.676 m)   Wt 56.7 kg (125 lb)   SpO2 96%   BMI 20.18 kg/m??    O2 Flow Rate (L/min): 2 l/min O2 Device: Room air    Temp (24hrs), Avg:97.8 ??F (36.6 ??C), Min:97.3 ??F (36.3 ??C), Max:98.4 ??F (36.9 ??C)    10/03 0701 - 10/03 1900  In: 200 [P.O.:200]  Out: 400 [Urine:400]   10/01  1901 - 10/03 0700  In: 360 [P.O.:360]  Out: 2951 [Urine:1250]      Data Review:       Recent Days:  Recent Labs     08/26/18  0646   WBC 7.1   HGB 10.1*   HCT 31.2*   PLT 203     Recent Labs     08/27/18  0638 08/26/18  0646 08/25/18  0844   NA  --  141 140   K  --  4.0 4.3   CL  --  102 104   CO2  --  28 30   GLU  --  143* 119*   BUN  --  26* 26*   CREA  --  0.7 0.7   CA  --  8.8 8.4*   MG 2.1 2.2 2.0   PHOS  --  2.5 3.0   ALB  --  2.4* 2.5*     No results for input(s): PH, PCO2, PO2, HCO3, FIO2 in the last 72 hours.    24 Hour Results:  Recent Results (from the past 24 hour(s))   MAGNESIUM    Collection Time: 08/27/18  6:38 AM   Result Value Ref Range    Magnesium 2.1 1.6 - 2.6 mg/dl       Xr Chest Sngl V    Result Date: 08/24/2018  INDICATION: Wheezing, SOB   EXAMINATION: XR CHEST SNGL V COMPARISON: 08/17/2018 FINDINGS: Mild cardiomegaly.. Small bilateral pleural effusions. Multiple focal lung lesions bilaterally. Diffuse osteolytic bony changes..        IMPRESSION: 1. Mild cardiomegaly. 2. Small bilateral pleural effusions. 3. Multiple pulmonary nodules consistent with metastases. 4. Diffuse osteolytic lesions consistent with bony metastases.     Xr Chest Sngl V    Result Date: 08/17/2018  Indication: Short of breath Frontal view chest Heart mildly enlarged. Shallow inspiration with interstitial and patchy alveolar densities throughout the lungs. Anterior cervical fusion at 3 adjacent lower cervical levels. Degenerative narrowing and sclerosis bilateral humeral joint. This rotated to the right.     IMPRESSION: Edema versus multifocal pneumonia or chronic changes bilaterally. Cardiomegaly. Postsurgical changes cervical spine.     Ct Head Wo Cont    Result Date: 08/17/2018  DICOM format image data is available to nonaffiliated external healthcare facilities or entities on a secure, media free, reciprocally searchable basis with patient authorization for 12 months following the date of the study. Indication: Altered  mental status Technique:  5 millimeter axial  images without contrast were obtained through the brain. Comparison: none Findings:   There is generalized moderate cerebral atrophy.  Low attenuation surrounds the lateral ventricles. There is a rounded 16 x 14 mm mass in the right thalamus surrounding low-density edema. The mass is hyperdense peripherally and low density centrally. There is also focal low-density in the right parietal white matter suggesting vasogenic edema. Focal low-density seen more laterally in the right parietal cortex measuring 2.2 cm diameter. Apparent 2.6 cm x 2.2 cm mass is seen in the right cerebellum with surrounding low-density suggesting vasogenic edema. Asymmetric 11 mm low-density irregular lytic lesion is seen in the diploic space of the right parietal bone. No hemorrhage seen. Sella, pineal, craniocervical junction, orbits, paranasal sinuses, and temporal bones are unremarkable.      Impression: cerebral atrophy and nonspecific white matter changes not unusual for age; multiple lesions as described in brain and calvarium worrisome for metastatic disease on this noncontrast study. Contrast MRI suggested for further evaluation.     Cta Chest W Or W Wo Cont    Result Date: 08/17/2018  DICOM format image data is available to nonaffiliated external healthcare facilities or entities on a secure, media free, reciprocally searchable basis with patient authorization for 12 months following the date of the study. Indication: Altered mental status Technique: 3 millimeter axial images with IV contrast obtained from lung apices to bases. 3-D MIP CTA images obtained. Comparison: Frontal view chest 08/17/2018 Findings:  Mediastinum: No aortic dissection or pulmonary embolus. Calcified aortic arch and mild coronary calcifications. 10 mm low-density focus right thyroid lobe. Mild cardiomegaly. Small bilateral pleural effusions. Lungs: Numerous nodules of varying sizes throughout the lungs largest of which  left lower lobe measures 18 x 19 mm. Focal pleural thickening adjacent to rib destruction right lower lobe posterolaterally. Bones/ chest wall: Extensive lytic lesions throughout the skeletal system of the thorax including sternum, spine, and ribs. High-grade compression fractures T3 and T4 with mild retropulsion. Anterior cervical fusion. Multiple masses are seen throughout the right breast the largest of which is partially imaged laterally at 6.8 x 3.5 cm; there is overlying right breast skin thickening. Asymmetric upper normal size nodes are seen in the right axilla. Below the diaphragm liver is heterogeneously low in density.     Impression: 1. No evidence of pulmonary embolus or aortic dissection. 2. 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 3. Indeterminate lesion right thyroid lobe which could be evaluated with ultrasound. 4. Cardiomegaly. 5. Bilateral small pleural effusions. 6. Multiple lesions throughout lungs and bones consistent with metastatic disease possibly from breast primary with asymmetric multi centric lesions throughout the right breast and overlying skin thickening with asymmetric upper normal size right axillary nodes. 7. Compression fractures T3 and T4 with retropulsion. 8. Heterogeneous liver. Metastatic disease not excluded.     Ct Abd Pelv W Cont    Result Date: 08/17/2018  DICOM format image data is available to nonaffiliated external healthcare facilities or entities on a secure, media free, reciprocally searchable basis with patient authorization for 12 months following the date of the study. TECHNIQUE: CT of the abdomen and pelvis WITH intravenous contrast.  The abdomen and pelvis were scanned utilizing a multidetector helical scanner from the diaphragm to the lesser trochanter without the oral administration of barium.  Coronal and sagittal reformations were obtained. COMPARISON: none INDICATION: Altered mental status DISCUSSION: HEPATOBILIARY: Multiple low-density  ill-defined lesions throughout the liver with mild hepatomegaly. Gallbladder mildly thick-walled but otherwise unremarkable. SPLEEN: No splenomegaly  or focal lesion. PANCREAS: Atrophic. ADRENALS: No adrenal nodules. KIDNEYS/URETERS: 17 mm rounded low-density right lower renal pole cystic cyst without hydronephrosis. PELVIC ORGANS/BLADDER: Uterus atrophic or removed. Extensive artifact in pelvis due to right hip replacement. PERITONEUM / RETROPERITONEUM: No free air or fluid. LYMPH NODES: No lymphadenopathy. VESSELS: Heavy calcification in nondilated aorta and iliac vessels. GI TRACT: No distention or wall thickening. No evidence of appendicitis. Large amount retained fecal material in colon and rectum. BONES AND SOFT TISSUES: Extensive lytic destruction of the bony structures throughout spine and pelvis with left hip replacement. Multiple compression fractures of vertebral bodies moderate grade at L3 and low-grade at L2 and L4 without significant retropulsion. Small fracture right superior acetabular margin laterally.     IMPRESSION: 1. 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 Compression fractures L2 through L4. Small acute fracture suspected at superior lateral right acetabular margin. 2. Indeterminate right renal lesion likely represent cyst which could be confirmed with ultrasound. 3. Left hip replacement. 4. Uterus atrophic or removed. 5. Atherosclerotic vascular disease. 6. Constipation.       Microbiology:  All Micro Results     None           Cardiology:  Results for orders placed or performed during the hospital encounter of 08/17/18   EKG, 12 LEAD, INITIAL   Result Value Ref Range    Ventricular Rate 120 BPM    Atrial Rate 120 BPM    P-R Interval 150 ms    QRS Duration 84 ms    Q-T Interval 384 ms    QTC Calculation (Bezet) 542 ms    Calculated P Axis 73 degrees    Calculated R Axis 0 degrees    Calculated T Axis -97 degrees    Diagnosis        Poor data quality, interpretation may be  adversely affected  Sinus tachycardia  Septal infarct , age undetermined  T wave abnormality, consider inferior ischemia  T wave abnormality, consider anterolateral ischemia  Prolonged QT  When compared with ECG of 17-Aug-2018 20:03,  Septal infarct is now present  T wave inversion now evident in Inferior leads  T wave inversion now evident in Anterolateral leads  Confirmed by Marrion Coy, M.D., Ellin Saba. ([id number]) on 08/21/2018 11:43:51 AM           Problem List:  Problem List as of 08/27/2018 Never Reviewed          Codes Class Noted - Resolved    Lactic acidosis ICD-10-CM: E87.2  ICD-9-CM: 276.2  08/17/2018 - Present        Altered mental status ICD-10-CM: R41.82  ICD-9-CM: 780.97  08/17/2018 - Present        Metastasis from breast cancer [facility]) ICD-10-CM: C79.9, C50.919  ICD-9-CM: 199.1, 174.9  08/17/2018 - Present              Medications reviewed  Current Facility-Administered Medications   Medication Dose Route Frequency   ??? levalbuterol (XOPENEX) nebulizer soln 1.25 mg/3 mL  1.25 mg Nebulization Q6H RT   ??? guaiFENesin (ROBITUSSIN) 100 mg/5 mL oral liquid 200 mg  200 mg Oral Q4H PRN   ??? furosemide (LASIX) tablet 20 mg  20 mg Oral DAILY   ??? potassium chloride SR (KLOR-CON 10) tablet 10 mEq  10 mEq Oral DAILY   ??? ibuprofen (MOTRIN) tablet 400 mg  400 mg Oral Q6H PRN   ??? nystatin (MYCOSTATIN) 100,000 unit/mL oral suspension 500,000 Units  500,000 Units Oral QID   ???  levalbuterol (XOPENEX) nebulizer soln 1.25 mg/3 mL  1.25 mg Nebulization Q4H PRN   ??? melatonin tablet 3 mg  3 mg Oral QHS   ??? haloperidol (HALDOL) tablet 1 mg  1 mg Oral QHS PRN   ??? sodium chloride (NS) flush 5-10 mL  5-10 mL IntraVENous Q8H   ??? sodium chloride (NS) flush 5-10 mL  5-10 mL IntraVENous PRN   ??? naloxone (NARCAN) injection 0.1 mg  0.1 mg IntraVENous PRN   ??? acetaminophen (TYLENOL) tablet 650 mg  650 mg Oral Q6H PRN   ??? enoxaparin (LOVENOX) injection 40 mg  40 mg SubCUTAneous Q24H   ??? influenza vaccine 2019-20 (4 yrs+)(PF) (FLUCELVAX QUAD) injection 0.5  mL  0.5 mL IntraMUSCular PRIOR TO DISCHARGE   ??? dexamethasone (DECADRON) 4 mg/mL injection 4 mg  4 mg IntraVENous Q6H         Kristen Loader, MD

## 2018-08-27 NOTE — Progress Notes (Signed)
Adult Malnutrition Guidelines:  SEVERE PROTEIN CALORIE MALNUTRITION IN THE CONTEXT OF ACUTE INJURY/ILLNESS  Weight loss:14% x54months  Energy intake: <50% energy intake compared to estimated energy needs >1 month  Body fat: severe depletion  Muscle mass: severe depletion  Other considerations: breast CA stage 4, brain mass, mets to liver    Please documentSevereProtein Calorie Malnutritionin Problem List if in agreement    Recommend initiate alternative nutrition if PO intake continues to be low. Rec. Jev 1.5 at 69ml/hr  Provides 1800kcals, 77 g protein and 1632 ml water    NUTRITION FOLLOW UP    Current diet order: DIET NUTRITIONAL SUPPLEMENTS Lunch, Dinner; Ensure [employer]  DIET NUTRITIONAL SUPPLEMENTS Dinner; Biochemist, clinical  DIET MECHANICAL SOFT    Vitamin/mineral supplementation: NS, potassium chloride    Other pertinent meds: Lasix     Current intake: []  N/A- NPO    []  very poor     [x]  poor      []  fair      []  good  % Diet Eaten  Avg: 43 %  Min: 25 %  Max: 60 %  Spoke to Care partner who reports intake minimal.     Weight:   Last 3 Recorded Weights in this Encounter    08/17/18 1829 08/18/18 0520 08/24/18 0647   Weight: 55.3 kg (122 lb) 57.5 kg (126 lb 12.2 oz) 56.7 kg (125 lb)       BMI: Body mass index is 20.18 kg/m.    Physical assessment:   Chewing/swallowing issues: Dentures   GI symptoms:    Last Bowel Movement Date: 08/27/18   Stool Appearance: Soft   Abdominal Assessment: Soft   Bowel Sounds: Active    Fluid retention:   Edema  LLE: Trace  RLE: Trace   Skin integrity: Intact     Biochemical data: Reviewed    Assessment of current MNT: Diet appropriate, adequate to meet nutrition needs if po >75% with meals. Supplements to increase calorie/pro intake opportunity     Nutrition diagnosis:   Severe acute protein calorie malnutritionrelated to breast CA stage IV, brain mass, mets to liver notedas evidenced bypo <50% x > 1 month, wt loss of 14% x 2 months, mod/severe physical wasting (continues)      Nutrition recommendation: Continue current MNT   Recommend initiate alternative nutrition if PO intake continues to be low. Rec. Jev 1.5 at 54ml/hr  Provides 1800kcals, 77 g protein and 1632 ml water    Nutrition monitoring:PO intake, diet tolerance and compliance, wt, BS, CMP, hydration, medical changes    Nutrition goals:PO >75% with meals/supplements, wt stable through LOS, nutrition related lab values WNL    Progress towards nutrition goals: []   Met/Ongoing     []   Progressing appropriately       []   Progressing slowly      []   Not Progressing    Nutrition level of care:  []  low     [x]  moderate     []  high    Code status: DNR      Discharge Placement: Unable to determine at this time(anticipate SNF for short term rehab palliative care seeing pt)    Adonis Huguenin, RD  08/27/18

## 2018-08-28 LAB — RENAL FUNCTION PANEL
Albumin: 2.5 gm/dl — ABNORMAL LOW (ref 3.4–5.0)
Albumin: 2.5 gm/dl — ABNORMAL LOW (ref 3.4–5.0)
Anion Gap: 11 mmol/L (ref 5–15)
Anion gap: 11 mmol/L (ref 5–15)
BUN: 24 mg/dl (ref 7–25)
BUN: 24 mg/dl (ref 7–25)
CO2: 31 mEq/L (ref 21–32)
CO2: 31 mEq/L (ref 21–32)
Calcium: 9 mg/dl (ref 8.5–10.1)
Calcium: 9 mg/dl (ref 8.5–10.1)
Chloride: 99 mEq/L (ref 98–107)
Chloride: 99 mEq/L (ref 98–107)
Creatinine: 0.6 mg/dl (ref 0.6–1.3)
Creatinine: 0.6 mg/dl (ref 0.6–1.3)
EGFR IF NonAfrican American: >60
GFR African American: >60
GFR est AA: >60
GFR est non-AA: >60
Glucose: 144 mg/dl — ABNORMAL HIGH (ref 74–106)
Glucose: 144 mg/dl — ABNORMAL HIGH (ref 74–106)
Phosphorus: 2.8 mg/dl (ref 2.5–4.9)
Phosphorus: 2.8 mg/dl (ref 2.5–4.9)
Potassium: 3.8 mEq/L (ref 3.5–5.1)
Potassium: 3.8 mEq/L (ref 3.5–5.1)
Sodium: 141 mEq/L (ref 136–145)
Sodium: 141 mEq/L (ref 136–145)

## 2018-08-28 LAB — CBC W/O DIFF
HCT: 32.5 % — ABNORMAL LOW (ref 37.0–50.0)
HGB: 10.7 gm/dl — ABNORMAL LOW (ref 13.0–17.2)
MCH: 29.2 pg (ref 25.4–34.6)
MCHC: 32.9 gm/dl (ref 30.0–36.0)
MCV: 88.6 fL (ref 80.0–98.0)
MPV: 11.4 fL — ABNORMAL HIGH (ref 6.0–10.0)
PLATELET: 171 10*3/uL (ref 140–450)
RBC: 3.67 M/uL (ref 3.60–5.20)
RDW-SD: 65.4 — ABNORMAL HIGH (ref 36.4–46.3)
WBC: 8.2 10*3/uL (ref 4.0–11.0)

## 2018-08-28 LAB — MAGNESIUM
Magnesium: 2.2 mg/dl (ref 1.6–2.6)
Magnesium: 2.2 mg/dl (ref 1.6–2.6)

## 2018-08-28 LAB — CALCIUM, IONIZED
CALCIUM,IONIZED: 4.3 mg/dl — ABNORMAL LOW (ref 4.4–5.4)
CALCIUM,IONIZED: 4.3 mg/dl — ABNORMAL LOW (ref 4.4–5.4)

## 2018-08-28 LAB — CBC
Hematocrit: 32.5 % — ABNORMAL LOW (ref 37.0–50.0)
Hemoglobin: 10.7 gm/dl — ABNORMAL LOW (ref 13.0–17.2)
MCH: 29.2 pg (ref 25.4–34.6)
MCHC: 32.9 gm/dl (ref 30.0–36.0)
MCV: 88.6 fL (ref 80.0–98.0)
MPV: 11.4 fL — ABNORMAL HIGH (ref 6.0–10.0)
Platelets: 171 10*3/uL (ref 140–450)
RBC: 3.67 M/uL (ref 3.60–5.20)
RDW-SD: 65.4 — ABNORMAL HIGH (ref 36.4–46.3)
WBC: 8.2 10*3/uL (ref 4.0–11.0)

## 2018-08-28 MED FILL — LEVALBUTEROL 1.25 MG/3 ML SOLN FOR INHALATION: 1.25 mg/3 mL | RESPIRATORY_TRACT | Qty: 3

## 2018-08-28 MED FILL — NYSTATIN 100,000 UNIT/ML ORAL SUSP: 100000 unit/mL | ORAL | Qty: 5

## 2018-08-28 MED FILL — DEXAMETHASONE SODIUM PHOSPHATE 4 MG/ML IJ SOLN: 4 mg/mL | INTRAMUSCULAR | Qty: 1

## 2018-08-28 MED FILL — POTASSIUM CHLORIDE SR 10 MEQ TAB: 10 mEq | ORAL | Qty: 1

## 2018-08-28 MED FILL — FUROSEMIDE 20 MG TAB: 20 mg | ORAL | Qty: 1

## 2018-08-28 MED FILL — BD POSIFLUSH NORMAL SALINE 0.9 % INJECTION SYRINGE: INTRAMUSCULAR | Qty: 10

## 2018-08-28 MED FILL — LOVENOX 40 MG/0.4 ML SUBCUTANEOUS SYRINGE: 40 mg/0.4 mL | SUBCUTANEOUS | Qty: 0.4

## 2018-08-28 MED FILL — MELATONIN 3 MG TAB: 3 mg | ORAL | Qty: 1

## 2018-08-28 MED FILL — HALOPERIDOL 1 MG TAB: 1 mg | ORAL | Qty: 1

## 2018-08-28 NOTE — Progress Notes (Signed)
Problem: Falls - Risk of  Goal: *Absence of Falls  Description  Document Kayla [name] Fall Risk and appropriate interventions in the flowsheet.  Outcome: Progressing Towards Goal  Note:   Fall Risk Interventions:  Mobility Interventions: Assess mobility with egress test, Bed/chair exit alarm, Communicate number of staff needed for ambulation/transfer    Mentation Interventions: Adequate sleep, hydration, pain control, Bed/chair exit alarm, Door open when patient unattended    Medication Interventions: Bed/chair exit alarm, Patient to call before getting OOB, Teach patient to arise slowly    Elimination Interventions: Bed/chair exit alarm, Call light in reach, Patient to call for help with toileting needs    History of Falls Interventions: Bed/chair exit alarm, Consult care management for discharge planning, Door open when patient unattended

## 2018-08-28 NOTE — Other (Signed)
Bedside and Verbal shift change report given to Cheron Schaumann   (oncoming nurse) by Freida Busman (offgoing nurse). Report included the following information SBAR, Kardex, Intake/Output and MAR.

## 2018-08-28 NOTE — Other (Signed)
Bedside and Verbal shift change report given to Galvin Proffer   (oncoming nurse) by Verdis Frederickson, RN (offgoing nurse). Report included the following information SBAR, Kardex and MAR.

## 2018-08-28 NOTE — Progress Notes (Signed)
Pt's right axillary wound having a moderate amount of bleeding. Per wound care note, mepilex applied. Will continue to monitor.

## 2018-08-28 NOTE — Progress Notes (Signed)
Hospitalist Progress Note  Kristen Loader, MD  [facility]    Daily Progress Note    Assessment/Plan:     Vasogenic cerebral edema??from??brain mass??  Suspected to be metastatic breast cancer  Pt has metabolic encephalopathy from this  Pt is on steroids and a 10 fraction course of XRT Mon-Friday (ending 10/8)  Dr Joylene Draft is following  She looks better than on admission per the family  Prognosis is poor.  ??  Dehydration  Rehydrated.??    Dyspnea  Probably mostly metastatic dz as CxR did not show CHF/edema and her lungs are clear on exam.  Pt did respond to Lasix though so there could be some CHF contribution  Added Lasix 20mg  daily (10/1)  ??  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   Family is taking things day by day and want to see what options are available  Per Dr Olene Floss note on 9/27, it looks like it will be just hormonal therapy and XRT.  Onc noted prognosis is poor.  Dr Rexford Maus gave Faslodex on 9/26 and the plan is to continue injections q2 weeks??  I spoke with Dr. Aldona Bar of oncology (10/1) who noted that the patient's performance status is poor and until she can get better, and the patient can go to outpatient follow-up with Dr. Brent General, he noted that the patient would not be eligible for any sort of chemotherapy or immunotherapy at this time.    He noted that the patient is only able to tolerate hormonal therapy  ??  Hypercalcemia  S/p Bisphophonate.  Ca coming down and is with some hypocalcemia  ??  SIRS  Sepsis ruled out  ??  Severe protein calorie malnutrition??  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  Pt was evaluated by Inpt wound care RN who noted a Right axillary indurated, discolored site measuring 3 x 3 cm.  There is a full-thickness  opening. She recommended daily cleansing of the area. If drainage noted, a Mepilex could be applied.  If the site becomes odorous they would recommend daily Dakin's 1/4 strength dampened gauze    Generalized weakness  PT/OT re-ordered  ??  Insomnia  Melatonin for sleep  ??  Acute hypoxemic respiratory failure  Transient noted on 9/30  Thought to be due to pulmonary edema as the pt responded to IV Lasix  Pt required hi flow O2 transiently  Resolved    Code status. DNR    Disposition  Family aware pt is doing poorly, but would like to see if pt can bounce back and get strong enough to get Chemo or Immunotherapy  Pt's XRT will end 10/8  Perhaps placement to SNF, but the XRT may hamper finding a facility  Will see what CM can find      ------------------    36 minutes were spent on the patient of which more than 50% was spent in coordination of care and counseling (time spent with patient/family face to face, physical exam, reviewing laboratory and imaging investigations, speaking with physicians and nursing staff involved in this patient's care)    ------------------  Physical exam:  General: Sleepy, NAD.  Cooperative.   HEENT: Sclera anicteric, PERRL, OM moist, throat clear.  Chest:  CTA B,  no wheeze, no rales, no rhonchi.  Good air movement.  CV: RRR, S1/S2 were normal, no murmur  Abdomen: NTND, soft, NABS, no masses were noted.  No rebound, no guarding.  Extremities: No edema.    Subjective:   No family at bedside. Pt drowsy. Said she was "ok", but was unable to provide additional hx or ROS      Objective:     Visit Vitals  BP 136/73 (BP 1 Location: Left arm, BP Patient Position: Supine)   Pulse 98   Temp 98.2 ??F (36.8 ??C)   Resp 16   Ht 5\' 6"  (1.676 m)   Wt 56.7 kg (125 lb)   SpO2 100%   BMI 20.18 kg/m??    O2 Flow Rate (L/min): 2 l/min O2 Device: Room air      Data Review:       Xr Chest Sngl V    Result Date: 08/24/2018  INDICATION: Wheezing, SOB   EXAMINATION: XR CHEST SNGL V COMPARISON:  08/17/2018 FINDINGS: Mild cardiomegaly.. Small bilateral pleural effusions. Multiple focal lung lesions bilaterally. Diffuse osteolytic bony changes..        IMPRESSION: 1. Mild cardiomegaly. 2. Small bilateral pleural effusions. 3. Multiple pulmonary nodules consistent with metastases. 4. Diffuse osteolytic lesions consistent with bony metastases.     Xr Chest Sngl V    Result Date: 08/17/2018  Indication: Short of breath Frontal view chest Heart mildly enlarged. Shallow inspiration with interstitial and patchy alveolar densities throughout the lungs. Anterior cervical fusion at 3 adjacent lower cervical levels. Degenerative narrowing and sclerosis bilateral humeral joint. This rotated to the right.     IMPRESSION: Edema versus multifocal pneumonia or chronic changes bilaterally. Cardiomegaly. Postsurgical changes cervical spine.     Ct Head Wo Cont    Result Date: 08/17/2018  DICOM format image data is available to nonaffiliated external healthcare facilities or entities on a secure, media free, reciprocally searchable basis with patient authorization for 12 months following the date of the study. Indication: Altered mental status Technique:  5 millimeter axial images without contrast were obtained through the brain. Comparison: none Findings:   There is generalized moderate cerebral atrophy.  Low attenuation surrounds the lateral ventricles. There is a rounded 16 x 14 mm mass in the right thalamus surrounding low-density edema. The mass is hyperdense peripherally and low density centrally. There is also focal low-density in the right parietal white matter suggesting vasogenic edema. Focal low-density seen more laterally in the right parietal cortex measuring 2.2 cm diameter. Apparent 2.6 cm x 2.2 cm mass is seen in the right cerebellum with surrounding low-density suggesting vasogenic edema. Asymmetric 11 mm low-density irregular lytic lesion is seen in the diploic  space of the right parietal bone. No hemorrhage seen. Sella, pineal, craniocervical junction, orbits, paranasal sinuses, and temporal bones are unremarkable.      Impression: cerebral atrophy and nonspecific white matter changes not unusual for age; multiple lesions as described in brain and calvarium worrisome for metastatic disease on this noncontrast study. Contrast MRI suggested for further evaluation.     Cta Chest W Or W Wo Cont    Result Date: 08/17/2018  DICOM format image data is available to nonaffiliated external healthcare facilities or entities on a secure, media free, reciprocally searchable basis with patient authorization for 12 months following the date of the study. Indication: Altered mental status Technique: 3 millimeter axial images with IV contrast obtained from lung apices to bases. 3-D MIP CTA images obtained. Comparison: Frontal view  chest 08/17/2018 Findings:  Mediastinum: No aortic dissection or pulmonary embolus. Calcified aortic arch and mild coronary calcifications. 10 mm low-density focus right thyroid lobe. Mild cardiomegaly. Small bilateral pleural effusions. Lungs: Numerous nodules of varying sizes throughout the lungs largest of which left lower lobe measures 18 x 19 mm. Focal pleural thickening adjacent to rib destruction right lower lobe posterolaterally. Bones/ chest wall: Extensive lytic lesions throughout the skeletal system of the thorax including sternum, spine, and ribs. High-grade compression fractures T3 and T4 with mild retropulsion. Anterior cervical fusion. Multiple masses are seen throughout the right breast the largest of which is partially imaged laterally at 6.8 x 3.5 cm; there is overlying right breast skin thickening. Asymmetric upper normal size nodes are seen in the right axilla. Below the diaphragm liver is heterogeneously low in density.     Impression: 1. No evidence of pulmonary embolus or aortic dissection. 2.  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 3. Indeterminate lesion right thyroid lobe which could be evaluated with ultrasound. 4. Cardiomegaly. 5. Bilateral small pleural effusions. 6. Multiple lesions throughout lungs and bones consistent with metastatic disease possibly from breast primary with asymmetric multi centric lesions throughout the right breast and overlying skin thickening with asymmetric upper normal size right axillary nodes. 7. Compression fractures T3 and T4 with retropulsion. 8. Heterogeneous liver. Metastatic disease not excluded.     Ct Abd Pelv W Cont    Result Date: 08/17/2018  DICOM format image data is available to nonaffiliated external healthcare facilities or entities on a secure, media free, reciprocally searchable basis with patient authorization for 12 months following the date of the study. TECHNIQUE: CT of the abdomen and pelvis WITH intravenous contrast.  The abdomen and pelvis were scanned utilizing a multidetector helical scanner from the diaphragm to the lesser trochanter without the oral administration of barium.  Coronal and sagittal reformations were obtained. COMPARISON: none INDICATION: Altered mental status DISCUSSION: HEPATOBILIARY: Multiple low-density ill-defined lesions throughout the liver with mild hepatomegaly. Gallbladder mildly thick-walled but otherwise unremarkable. SPLEEN: No splenomegaly or focal lesion. PANCREAS: Atrophic. ADRENALS: No adrenal nodules. KIDNEYS/URETERS: 17 mm rounded low-density right lower renal pole cystic cyst without hydronephrosis. PELVIC ORGANS/BLADDER: Uterus atrophic or removed. Extensive artifact in pelvis due to right hip replacement. PERITONEUM / RETROPERITONEUM: No free air or fluid. LYMPH NODES: No lymphadenopathy. VESSELS: Heavy calcification in nondilated aorta and iliac vessels. GI TRACT: No distention or wall thickening. No evidence of appendicitis. Large amount  retained fecal material in colon and rectum. BONES AND SOFT TISSUES: Extensive lytic destruction of the bony structures throughout spine and pelvis with left hip replacement. Multiple compression fractures of vertebral bodies moderate grade at L3 and low-grade at L2 and L4 without significant retropulsion. Small fracture right superior acetabular margin laterally.     IMPRESSION: 1. 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 Compression fractures L2 through L4. Small acute fracture suspected at superior lateral right acetabular margin. 2. Indeterminate right renal lesion likely represent cyst which could be confirmed with ultrasound. 3. Left hip replacement. 4. Uterus atrophic or removed. 5. Atherosclerotic vascular disease. 6. Constipation.       Microbiology:  All Micro Results     None           Cardiology:  Results for orders placed or performed during the hospital encounter of 08/17/18   EKG, 12 LEAD, INITIAL   Result Value Ref Range    Ventricular Rate 120 BPM    Atrial Rate 120 BPM  P-R Interval 150 ms    QRS Duration 84 ms    Q-T Interval 384 ms    QTC Calculation (Bezet) 542 ms    Calculated P Axis 73 degrees    Calculated R Axis 0 degrees    Calculated T Axis -97 degrees    Diagnosis        Poor data quality, interpretation may be adversely affected  Sinus tachycardia  Septal infarct , age undetermined  T wave abnormality, consider inferior ischemia  T wave abnormality, consider anterolateral ischemia  Prolonged QT  When compared with ECG of 17-Aug-2018 20:03,  Septal infarct is now present  T wave inversion now evident in Inferior leads  T wave inversion now evident in Anterolateral leads  Confirmed by Marrion Coy, M.D., Ellin Saba. ([id number]) on 08/21/2018 11:43:51 AM           Problem List:  Problem List Never Reviewed          Codes Class Noted - Resolved    Lactic acidosis ICD-10-CM: E87.2  ICD-9-CM: 276.2  08/17/2018 - Present        Altered mental status ICD-10-CM: R41.82   ICD-9-CM: 780.97  08/17/2018 - Present        Metastasis from breast cancer [facility]) ICD-10-CM: C79.9, C50.919  ICD-9-CM: 199.1, 174.9  08/17/2018 - Present              Medications reviewed  Current Facility-Administered Medications   Medication Dose Route Frequency   ??? levalbuterol (XOPENEX) nebulizer soln 1.25 mg/3 mL  1.25 mg Nebulization Q6H PRN   ??? guaiFENesin (ROBITUSSIN) 100 mg/5 mL oral liquid 200 mg  200 mg Oral Q4H PRN   ??? furosemide (LASIX) tablet 20 mg  20 mg Oral DAILY   ??? potassium chloride SR (KLOR-CON 10) tablet 10 mEq  10 mEq Oral DAILY   ??? ibuprofen (MOTRIN) tablet 400 mg  400 mg Oral Q6H PRN   ??? nystatin (MYCOSTATIN) 100,000 unit/mL oral suspension 500,000 Units  500,000 Units Oral QID   ??? levalbuterol (XOPENEX) nebulizer soln 1.25 mg/3 mL  1.25 mg Nebulization Q4H PRN   ??? melatonin tablet 3 mg  3 mg Oral QHS   ??? haloperidol (HALDOL) tablet 1 mg  1 mg Oral QHS PRN   ??? sodium chloride (NS) flush 5-10 mL  5-10 mL IntraVENous Q8H   ??? sodium chloride (NS) flush 5-10 mL  5-10 mL IntraVENous PRN   ??? naloxone (NARCAN) injection 0.1 mg  0.1 mg IntraVENous PRN   ??? acetaminophen (TYLENOL) tablet 650 mg  650 mg Oral Q6H PRN   ??? enoxaparin (LOVENOX) injection 40 mg  40 mg SubCUTAneous Q24H   ??? influenza vaccine 2019-20 (4 yrs+)(PF) (FLUCELVAX QUAD) injection 0.5 mL  0.5 mL IntraMUSCular PRIOR TO DISCHARGE   ??? dexamethasone (DECADRON) 4 mg/mL injection 4 mg  4 mg IntraVENous Q6H         Kristen Loader, MD

## 2018-08-28 NOTE — Progress Notes (Signed)
Palliative Care RN Progress Note  Palliative Care Team is available Monday-Friday 8 AM-4:30 PM       NAME:  Kayla [name]   DOB:   19-Sep-1941   MRN:   [id number]     Date/Time Patient Seen:  08/28/2018 10:14 AM    Attending Physician: Kristen Loader, MD  Primary Care Physician: Henrene Pastor, MD        Palliative Assessment/Plan:     Code Status:  DNR (confirmed by children today)  Goals of Care: Continue current treatment, children understand patients overall prognosis is poor and her only option at this time is to work on increasing her strength/conditiong to potentially be able to tolerate chemotherapy in the next few weeks.  In the meantime they want to focus on PT/OT and increasing her appetite/nutrition.  They also confirmed wanting to change her code status to DNR.      Patient restless in bed today and confused, no family present, no changes to [place].  OT worked with patient yesterday and documented "Patient???s rehabilitation potential is considered to be Fair d/t medical complexities"      Disposition: TBD, potential rehab upon discharge unless patient declines any further and would need comfort/hospice    Current Capacity for Decision Making:  Awake, but lethargic and confused, not decisional      Advance Care Planning:      Advance Directives:          Medical Power of Attorney: none          Living Will: none          POLST forms:  There is no POLST (Physician orders for life-sustaining treatment) form on file for this patient     No Advance Directive: Surrogate Decision Maker: [name] (daughter) [phone]; [name] (daughter) [phone]; Marya Amsler (daughter) [phone]; Cherie Ouch (son) [phone]          The following were updated:  Discussed with Attending Physician/Provider: Kristen Loader, MD   Other Physician (consultant): reviewed chart notes   Nursing: RN  Care Manager/Discharge Planner: n/a today      Aline August RN, BSN, [facility]  Palliative Medicine  [phone] office       The Palliative Medicine team is available Monday to Friday from 8am to 4:30pm at [phone]    Interval History:       HPI:      HPI: Pt is 78 year old female with breast CA followed Dr. Brent General in [place] undergoing systemic hormal therapy with faslodex. Admitted on 08/17/18 for AMS; head/abd/pelv/chest CT in ED worrisome for brain with vasogenic edemia, lung, liver mets; pt undergoing palliative WBR but unable to tolerate lying flat and then later declined to complete simulation mapping to lumbar spine fields per Dr. Callie Fielding; planning for x1 dose of faslodex but overall poor prognosis per [facility] Dr. Rexford Maus. Pertinent comorbidities include HTN.  PTA medications per EMR: Lisinopril, fentanyl TD 69mcg/hr, oxycodone IR 10mg  PO Q4hr PRN.  Social History:   Religious/Cultural/Spiritual: CHRISTIAN  Insurance:  Payor: Psychologist, prison and probation services / Plan: [place] / Product Type: Managed Care Medicare /    ECOG Performance Status (current): 4  Prior ECOG: 4  Palliative Performance Scale (PPS): 10-20%  Prior PPS: 20-30%  FAST Scale in Dementia: n/a  PC Consulted [id number] by Dr. Nechama Guard  on 9/27  for goals of care, metastatic breast cancer     Review of Systems:   Unable to obtain due to clinical circumstance  Physical Exam:   Blood pressure 141/76, pulse (!) 103, temperature 98.1 ??F (36.7 ??C), resp. rate 22, height 5\' 6"  (1.676 m), weight 56.7 kg (125 lb), SpO2 99 %.  Body mass index is 20.18 kg/m??.  Wt Readings from Last 3 Encounters:   08/24/18 56.7 kg (125 lb)     Last Bowel Movement: Last Bowel Movement Date: 08/27/18(per report)

## 2018-08-28 NOTE — Progress Notes (Signed)
physical Therapy treatment    Patient: Kayla [name] (77 y.o. 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  Weight bearing precautions: None      ASSESSMENT :  Based on the objective data described below, the patient presents with    Generalized muscle weakness affecting function   Decreased Strength  Decreased ADL/Functional Activities  Decreased Transfer Abilities  Decreased Ambulation Ability/Technique  Decreased Balance - sitting and standing  Decreased Activity Tolerance, fatigue and shortness of breath  Decreased cognition    Mod A x 1 with supine to sit/sit to supine  Poor plus tolerance with B LE exercises  Poor plus tolerance to sitting EOB with good minus static sitting balance  Unable to progress to standing, pt pushing self back to bed after sitting EOB, pt c/o fatigue      Patient???s rehabilitation potential is considered to be FAIR d/t medical prognosis    Recommendations:  Physical Therapy and Occupational Therapy  Discharge Recommendations: SNF and 24 hour supervision for safety  Further Equipment Recommendations for Discharge: to be determined        PLAN :  Planned Interventions:  Functional mobility training Gait Training Stair training Balance Training Therapeutic exercises Therapeutic activities Neuro muscular re-education AD training Patient/caregiver education      Frequency/Duration: Patient will be followed by physical therapy 3x / Week x4weeks to address goals.                 Orders reviewed, chart reviewed on [facility].    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 Back pain    ??? Breast cancer ([place])          Patient found: Bed, IV and  bed alarm. [employer]    Pain Assessment before PT session: None observed and None reported  Pain Location:   Pain Assessment after PT session: None observed and None reported  Pain Location:    []           Yes, patient had pain medications  []           No, Patient has not had pain medications  []           Nurse notified    COGNITIVE STATUS:     Pt lethargic, follows 1 step commands inconsistently (<25%), verbal, tactile, manual cues required      Functional mobility and balance status:     Supine to sit -  mod. assist, verbal cues, set-up, safety concerns, increased time and 1 person  Sit to Supine -  mod. assist, verbal cues, manual cues, visual cues, safety concerns,  increased time and 1 person   Poor plus tolerance to sitting EOB with good minus static sitting balance  Unable to progress to standing, pt pushing self back to bed after sitting EOB, pt c/o fatigue      Ambulation/Gait Training:  unable        Lower Extremities:  Supine, Heel Slide, Straight leg raise, Hip ab/duction, Ankle pumps, BLE, Assisted, 10 reps and Cueing x 10 reps each        Activity Tolerance:   poor+    Final Location:   bed, bed alarm, all needs close, agrees to call for assistance and nurse notified     COMMUNICATION/EDUCATION:   Education: Patient, Benefit of activity while hospitalized, Call for assistance, Out of bed 2-3 times/day, Staff assistance with mobility, Changes positions frequently, Sit out of bed for 45-60 minutes or as tolerated, Safety, Functional mobility, Demonstrates adequately, Verbalized understanding, Teaching method, Verbal and Role of PT  Barriers to Learning/Limitations: None     Please refer to care plan and patient education section for further details.    Thank you for this referral.  Joanalyne A. Jamesetta Geralds, PT

## 2018-08-28 NOTE — Progress Notes (Signed)
Problem: Falls - Risk of  Goal: *Absence of Falls  Description  Document Kayla [name] Fall Risk and appropriate interventions in the flowsheet.  Outcome: Progressing Towards Goal  Note: Fall Risk Interventions:  Mobility Interventions: Assess mobility with egress test, Bed/chair exit alarm, Communicate number of staff needed for ambulation/transfer    Mentation Interventions: Adequate sleep, hydration, pain control, Bed/chair exit alarm, Door open when patient unattended    Medication Interventions: Bed/chair exit alarm, Patient to call before getting OOB, Teach patient to arise slowly    Elimination Interventions: Bed/chair exit alarm, Call light in reach, Patient to call for help with toileting needs    History of Falls Interventions: Bed/chair exit alarm, Consult care management for discharge planning, Door open when patient unattended

## 2018-08-28 NOTE — Progress Notes (Signed)
Hospitalist Progress Note  Kristen Loader, MD  [facility]    Daily Progress Note    Assessment/Plan:     Vasogenic cerebral edema??from??brain mass??  Suspected to be metastatic breast cancer  Pt has metabolic encephalopathy from this  Pt is on steroids and a 10 fraction course of XRT Mon-Friday (ending 10/8)  Dr Joylene Draft is following  She looks better than on admission per the family  Prognosis is poor.  ??  Dehydration  Rehydrated.??    Dyspnea  Probably mostly metastatic dz as CxR did not show CHF/edema and her lungs are clear on exam.  Pt did respond to Lasix though so there could be some CHF contribution  Added Lasix 20mg  daily (10/1)  ??  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   Family is taking things day by day and want to see what options are available  Per Dr Olene Floss note on 9/27, it looks like it will be just hormonal therapy and XRT.  Onc noted prognosis is poor.  Dr Rexford Maus gave Faslodex on 9/26 and the plan is to continue injections q2 weeks??  I spoke with Dr. Aldona Bar of oncology (10/1) who noted that the patient's performance status is poor and until she can get better, and the patient can go to outpatient follow-up with Dr. Brent General, he noted that the patient would not be eligible for any sort of chemotherapy or immunotherapy at this time.    He noted that the patient is only able to tolerate hormonal therapy  ??  Hypercalcemia  S/p Bisphophonate.  Ca coming down and is with some hypocalcemia  ??  SIRS  Sepsis ruled out  ??  Severe protein calorie malnutrition??  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  Pt was evaluated by Inpt wound care RN who noted a Right axillary indurated, discolored site measuring 3 x 3 cm.  There is a full-thickness opening. She  recommended daily cleansing of the area. If drainage noted, a Mepilex could be applied.  If the site becomes odorous they would recommend daily Dakin's 1/4 strength dampened gauze    Generalized weakness  PT/OT re-ordered  ??  Insomnia  Melatonin for sleep  ??  Acute hypoxemic respiratory failure  Transient noted on 9/30  Thought to be due to pulmonary edema as the pt responded to IV Lasix  Pt required hi flow O2 transiently  Resolved    Code status. DNR    Disposition  Family aware pt is doing poorly, but would like to see if pt can bounce back and get strong enough to get Chemo or Immunotherapy  Pt's XRT will end 10/8  Perhaps placement to SNF, but the XRT may hamper finding a facility  Will see what CM can find      ------------------    36 minutes were spent on the patient of which more than 50% was spent in coordination of care and counseling (time spent with patient/family face to face, physical exam, reviewing laboratory and imaging investigations, speaking with physicians and nursing staff involved in this patient's care)    ------------------  Physical exam:  General: Sleepy, NAD.  Cooperative.   HEENT: Sclera anicteric, PERRL, OM moist, throat clear.  Chest:  CTA B,  no wheeze, no rales, no rhonchi.  Good air movement.  CV: RRR, S1/S2 were normal, no murmur  Abdomen: NTND, soft, NABS, no masses were noted.  No rebound, no guarding.  Extremities: No edema.    Subjective:   No family at bedside. Pt drowsy. Said she was "ok", but was unable to provide additional hx or ROS      Objective:     Visit Vitals  BP 136/73 (BP 1 Location: Left arm, BP Patient Position: Supine)   Pulse 98   Temp 98.2 ??F (36.8 ??C)   Resp 16   Ht 5\' 6"  (1.676 m)   Wt 56.7 kg (125 lb)   SpO2 100%   BMI 20.18 kg/m??    O2 Flow Rate (L/min): 2 l/min O2 Device: Room air      Data Review:       Xr Chest Sngl V    Result Date: 08/24/2018  INDICATION: Wheezing, SOB   EXAMINATION: XR CHEST SNGL V COMPARISON: 08/17/2018 FINDINGS: Mild cardiomegaly..  Small bilateral pleural effusions. Multiple focal lung lesions bilaterally. Diffuse osteolytic bony changes..        IMPRESSION: 1. Mild cardiomegaly. 2. Small bilateral pleural effusions. 3. Multiple pulmonary nodules consistent with metastases. 4. Diffuse osteolytic lesions consistent with bony metastases.     Xr Chest Sngl V    Result Date: 08/17/2018  Indication: Short of breath Frontal view chest Heart mildly enlarged. Shallow inspiration with interstitial and patchy alveolar densities throughout the lungs. Anterior cervical fusion at 3 adjacent lower cervical levels. Degenerative narrowing and sclerosis bilateral humeral joint. This rotated to the right.     IMPRESSION: Edema versus multifocal pneumonia or chronic changes bilaterally. Cardiomegaly. Postsurgical changes cervical spine.     Ct Head Wo Cont    Result Date: 08/17/2018  DICOM format image data is available to nonaffiliated external healthcare facilities or entities on a secure, media free, reciprocally searchable basis with patient authorization for 12 months following the date of the study. Indication: Altered mental status Technique:  5 millimeter axial images without contrast were obtained through the brain. Comparison: none Findings:   There is generalized moderate cerebral atrophy.  Low attenuation surrounds the lateral ventricles. There is a rounded 16 x 14 mm mass in the right thalamus surrounding low-density edema. The mass is hyperdense peripherally and low density centrally. There is also focal low-density in the right parietal white matter suggesting vasogenic edema. Focal low-density seen more laterally in the right parietal cortex measuring 2.2 cm diameter. Apparent 2.6 cm x 2.2 cm mass is seen in the right cerebellum with surrounding low-density suggesting vasogenic edema. Asymmetric 11 mm low-density irregular lytic lesion is seen in the diploic space of the right parietal bone. No hemorrhage seen. Sella, pineal, craniocervical  junction, orbits, paranasal sinuses, and temporal bones are unremarkable.      Impression: cerebral atrophy and nonspecific white matter changes not unusual for age; multiple lesions as described in brain and calvarium worrisome for metastatic disease on this noncontrast study. Contrast MRI suggested for further evaluation.     Cta Chest W Or W Wo Cont    Result Date: 08/17/2018  DICOM format image data is available to nonaffiliated external healthcare facilities or entities on a secure, media free, reciprocally searchable basis with patient authorization for 12 months following the date of the study. Indication: Altered mental status Technique: 3 millimeter axial images with IV contrast obtained from lung apices to bases. 3-D MIP CTA images obtained. Comparison: Frontal view  chest 08/17/2018 Findings:  Mediastinum: No aortic dissection or pulmonary embolus. Calcified aortic arch and mild coronary calcifications. 10 mm low-density focus right thyroid lobe. Mild cardiomegaly. Small bilateral pleural effusions. Lungs: Numerous nodules of varying sizes throughout the lungs largest of which left lower lobe measures 18 x 19 mm. Focal pleural thickening adjacent to rib destruction right lower lobe posterolaterally. Bones/ chest wall: Extensive lytic lesions throughout the skeletal system of the thorax including sternum, spine, and ribs. High-grade compression fractures T3 and T4 with mild retropulsion. Anterior cervical fusion. Multiple masses are seen throughout the right breast the largest of which is partially imaged laterally at 6.8 x 3.5 cm; there is overlying right breast skin thickening. Asymmetric upper normal size nodes are seen in the right axilla. Below the diaphragm liver is heterogeneously low in density.     Impression: 1. No evidence of pulmonary embolus or aortic dissection. 2. 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 3. Indeterminate lesion right thyroid lobe which could be evaluated  with ultrasound. 4. Cardiomegaly. 5. Bilateral small pleural effusions. 6. Multiple lesions throughout lungs and bones consistent with metastatic disease possibly from breast primary with asymmetric multi centric lesions throughout the right breast and overlying skin thickening with asymmetric upper normal size right axillary nodes. 7. Compression fractures T3 and T4 with retropulsion. 8. Heterogeneous liver. Metastatic disease not excluded.     Ct Abd Pelv W Cont    Result Date: 08/17/2018  DICOM format image data is available to nonaffiliated external healthcare facilities or entities on a secure, media free, reciprocally searchable basis with patient authorization for 12 months following the date of the study. TECHNIQUE: CT of the abdomen and pelvis WITH intravenous contrast.  The abdomen and pelvis were scanned utilizing a multidetector helical scanner from the diaphragm to the lesser trochanter without the oral administration of barium.  Coronal and sagittal reformations were obtained. COMPARISON: none INDICATION: Altered mental status DISCUSSION: HEPATOBILIARY: Multiple low-density ill-defined lesions throughout the liver with mild hepatomegaly. Gallbladder mildly thick-walled but otherwise unremarkable. SPLEEN: No splenomegaly or focal lesion. PANCREAS: Atrophic. ADRENALS: No adrenal nodules. KIDNEYS/URETERS: 17 mm rounded low-density right lower renal pole cystic cyst without hydronephrosis. PELVIC ORGANS/BLADDER: Uterus atrophic or removed. Extensive artifact in pelvis due to right hip replacement. PERITONEUM / RETROPERITONEUM: No free air or fluid. LYMPH NODES: No lymphadenopathy. VESSELS: Heavy calcification in nondilated aorta and iliac vessels. GI TRACT: No distention or wall thickening. No evidence of appendicitis. Large amount retained fecal material in colon and rectum. BONES AND SOFT TISSUES: Extensive lytic destruction of the bony structures throughout spine and pelvis with left hip replacement.  Multiple compression fractures of vertebral bodies moderate grade at L3 and low-grade at L2 and L4 without significant retropulsion. Small fracture right superior acetabular margin laterally.     IMPRESSION: 1. 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 Compression fractures L2 through L4. Small acute fracture suspected at superior lateral right acetabular margin. 2. Indeterminate right renal lesion likely represent cyst which could be confirmed with ultrasound. 3. Left hip replacement. 4. Uterus atrophic or removed. 5. Atherosclerotic vascular disease. 6. Constipation.       Microbiology:  All Micro Results     None           Cardiology:  Results for orders placed or performed during the hospital encounter of 08/17/18   EKG, 12 LEAD, INITIAL   Result Value Ref Range    Ventricular Rate 120 BPM    Atrial Rate 120 BPM  P-R Interval 150 ms    QRS Duration 84 ms    Q-T Interval 384 ms    QTC Calculation (Bezet) 542 ms    Calculated P Axis 73 degrees    Calculated R Axis 0 degrees    Calculated T Axis -97 degrees    Diagnosis        Poor data quality, interpretation may be adversely affected  Sinus tachycardia  Septal infarct , age undetermined  T wave abnormality, consider inferior ischemia  T wave abnormality, consider anterolateral ischemia  Prolonged QT  When compared with ECG of 17-Aug-2018 20:03,  Septal infarct is now present  T wave inversion now evident in Inferior leads  T wave inversion now evident in Anterolateral leads  Confirmed by Marrion Coy, M.D., Ellin Saba. 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 levalbuterol (XOPENEX) nebulizer soln 1.25 mg/3 mL  1.25 mg Nebulization Q6H PRN   ??? guaiFENesin (ROBITUSSIN) 100 mg/5 mL oral liquid 200 mg  200 mg Oral Q4H PRN   ??? furosemide (LASIX) tablet 20 mg  20 mg Oral DAILY   ??? potassium chloride SR (KLOR-CON 10) tablet 10 mEq  10 mEq Oral DAILY   ??? ibuprofen (MOTRIN) tablet 400 mg  400 mg Oral Q6H PRN   ??? nystatin (MYCOSTATIN) 100,000 unit/mL oral suspension 500,000 Units  500,000 Units Oral QID   ??? levalbuterol (XOPENEX) nebulizer soln 1.25 mg/3 mL  1.25 mg Nebulization Q4H PRN   ??? melatonin tablet 3 mg  3 mg Oral QHS   ??? haloperidol (HALDOL) tablet 1 mg  1 mg Oral QHS PRN   ??? sodium chloride (NS) flush 5-10 mL  5-10 mL IntraVENous Q8H   ??? sodium chloride (NS) flush 5-10 mL  5-10 mL IntraVENous PRN   ??? naloxone (NARCAN) injection 0.1 mg  0.1 mg IntraVENous PRN   ??? acetaminophen (TYLENOL) tablet 650 mg  650 mg Oral Q6H PRN   ??? enoxaparin (LOVENOX) injection 40 mg  40 mg SubCUTAneous Q24H   ??? influenza vaccine 2019-20 (4 yrs+)(PF) (FLUCELVAX QUAD) injection 0.5 mL  0.5 mL IntraMUSCular PRIOR TO DISCHARGE   ??? dexamethasone (DECADRON) 4 mg/mL injection 4 mg  4 mg IntraVENous Q6H         Kristen Loader, MD

## 2018-08-28 NOTE — Progress Notes (Signed)
Palliative Care RN Progress Note  Palliative Care Team is available Monday-Friday 8 AM-4:30 PM       NAME:  Kayla [name]   DOB:   04-10-1941   MRN:   [id number]     Date/Time Patient Seen:  08/28/2018 10:14 AM    Attending Physician: Alvy Bimler, MD  Primary Care Physician: Thomasenia Sales, MD        Palliative Assessment/Plan:     Code Status:  DNR (confirmed by children today)  Goals of Care: Continue current treatment, children understand patients overall prognosis is poor and her only option at this time is to work on increasing her strength/conditiong to potentially be able to tolerate chemotherapy in the next few weeks.  In the meantime they want to focus on PT/OT and increasing her appetite/nutrition.  They also confirmed wanting to change her code status to DNR.      Patient restless in bed today and confused, no family present, no changes to GOC.  OT worked with patient yesterday and documented "Patient's rehabilitation potential is considered to be Fair d/t medical complexities"      Disposition: TBD, potential rehab upon discharge unless patient declines any further and would need comfort/hospice    Current Capacity for Decision Making:  Awake, but lethargic and confused, not decisional      Advance Care Planning:      Advance Directives:          Medical Power of Attorney: none          Living Will: none          POLST forms:  There is no POLST (Physician orders for life-sustaining treatment) form on file for this patient     No Advance Directive: Surrogate Decision Maker: [name] (daughter) [phone]; [name] (daughter) [phone]; Erenest Rasher (daughter) [phone]; Marius Ditch (son) [phone]          The following were updated:  Discussed with Attending Physician/Provider: Alvy Bimler, MD   Other Physician (consultant): reviewed chart notes   Nursing: RN  Care Manager/Discharge Planner: n/a today      Clydene Pugh RN, BSN, [facility]  Palliative Medicine  [phone]  office      The Palliative Medicine team is available Monday to Friday from 8am to 4:30pm at [phone]    Interval History:       HPI:      HPI: Pt is 77 year old female with breast CA followed Dr. Emilie Rutter in [place] undergoing systemic hormal therapy with faslodex. Admitted on 08/17/18 for AMS; head/abd/pelv/chest CT in ED worrisome for brain with vasogenic edemia, lung, liver mets; pt undergoing palliative WBR but unable to tolerate lying flat and then later declined to complete simulation mapping to lumbar spine fields per Dr. Delton Prairie; planning for x1 dose of faslodex but overall poor prognosis per [facility] Dr. Lamarr Lulas. Pertinent comorbidities include HTN.  PTA medications per EMR: Lisinopril, fentanyl TD 59mcg/hr, oxycodone IR 10mg  PO Q4hr PRN.  Social History:   Religious/Cultural/Spiritual: CHRISTIAN  Insurance:  Payor: [employer] / Plan: [facility] [employer] MEDICARE / Product Type: Managed Care Medicare /    ECOG Performance Status (current): 4  Prior ECOG: 4  Palliative Performance Scale (PPS): 10-20%  Prior PPS: 20-30%  FAST Scale in Dementia: n/a  PC Consulted [id number] by Dr. Willaim Bane  on 9/27  for goals of care, metastatic breast cancer     Review of Systems:   Unable to obtain due to clinical circumstance  Physical Exam:   Blood pressure 141/76, pulse (!) 103, temperature 98.1 F (36.7 C), resp. rate 22, height 5\' 6"  (1.676 m), weight 56.7 kg (125 lb), SpO2 99 %.  Body mass index is 20.18 kg/m.  Wt Readings from Last 3 Encounters:   08/24/18 56.7 kg (125 lb)     Last Bowel Movement: Last Bowel Movement Date: 08/27/18(per report)

## 2018-08-28 NOTE — Progress Notes (Signed)
 physical Therapy treatment    Patient: Kayla [name] (77 y.o. 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  Weight bearing precautions: None      ASSESSMENT :  Based on the objective data described below, the patient presents with    Generalized muscle weakness affecting function   Decreased Strength  Decreased ADL/Functional Activities  Decreased Transfer Abilities  Decreased Ambulation Ability/Technique  Decreased Balance - sitting and standing  Decreased Activity Tolerance, fatigue and shortness of breath  Decreased cognition    Mod A x 1 with supine to sit/sit to supine  Poor plus tolerance with B LE exercises  Poor plus tolerance to sitting EOB with good minus static sitting balance  Unable to progress to standing, pt pushing self back to bed after sitting EOB, pt c/o fatigue      Patient's rehabilitation potential is considered to be FAIR d/t medical prognosis    Recommendations:  Physical Therapy and Occupational Therapy  Discharge Recommendations: SNF and 24 hour supervision for safety  Further Equipment Recommendations for Discharge: to be determined        PLAN :  Planned Interventions:  Functional mobility training Gait Training Stair training Balance Training Therapeutic exercises Therapeutic activities Neuro muscular re-education AD training Patient/caregiver education      Frequency/Duration: Patient will be followed by physical therapy 3x / Week x4weeks to address goals.                 Orders reviewed, chart reviewed on [facility].    SUBJECTIVE:   Patient: Move your leg.    OBJECTIVE DATA SUMMARY:   Present illness history:   Problem List  Never Reviewed          Codes Class Noted    Lactic acidosis ICD-10-CM: E87.2  ICD-9-CM: 276.2  08/17/2018        Altered mental  status ICD-10-CM: R41.82  ICD-9-CM: 780.97  08/17/2018        Metastasis from breast cancer [facility] ICD-10-CM: C79.9, C50.919  ICD-9-CM: 199.1, 174.9  08/17/2018             Past Medical history:   Past Medical History:   Diagnosis Date   . Back pain    . Breast cancer [facility])          Patient found: Bed, IV and  bed alarm. [employer]    Pain Assessment before PT session: None observed and None reported  Pain Location:   Pain Assessment after PT session: None observed and None reported  Pain Location:    []           Yes, patient had pain medications  []           No, Patient has not had pain medications  []           Nurse notified    COGNITIVE STATUS:     Pt lethargic, follows 1 step commands inconsistently (<25%), verbal, tactile, manual cues required      Functional mobility and balance status:     Supine to sit -  mod. assist, verbal cues, set-up, safety concerns, increased time and 1 person  Sit to Supine -  mod. assist, verbal cues, manual cues, visual cues, safety concerns,  increased time and 1 person   Poor plus tolerance to sitting EOB with good minus static sitting balance  Unable to progress to standing, pt pushing self back to bed after sitting EOB, pt c/o fatigue      Ambulation/Gait Training:  unable        Lower Extremities:  Supine, Heel Slide, Straight leg raise, Hip ab/duction, Ankle pumps, BLE, Assisted, 10 reps and Cueing x 10 reps each        Activity Tolerance:   poor+    Final Location:   bed, bed alarm, all needs close, agrees to call for assistance and nurse notified     COMMUNICATION/EDUCATION:   Education: Patient, Benefit of activity while hospitalized, Call for assistance, Out of bed 2-3 times/day, Staff assistance with mobility, Changes positions frequently, Sit out of bed for 45-60 minutes or as tolerated, Safety, Functional mobility, Demonstrates adequately, Verbalized understanding, Teaching method, Verbal and Role of PT  Barriers to Learning/Limitations: None    Please refer to care plan and  patient education section for further details.    Thank you for this referral.  Joanalyne A. Ramiro, PT

## 2018-08-29 LAB — MAGNESIUM
Magnesium: 2.2 mg/dl (ref 1.6–2.6)
Magnesium: 2.2 mg/dl (ref 1.6–2.6)

## 2018-08-29 MED ORDER — LEVALBUTEROL 1.25 MG/3 ML SOLN FOR INHALATION
1.25 mg/3 mL | Freq: Two times a day (BID) | RESPIRATORY_TRACT | Status: DC
Start: 2018-08-29 — End: 2018-09-01
  Administered 2018-08-29 – 2018-09-01 (×5): via RESPIRATORY_TRACT

## 2018-08-29 MED FILL — DEXAMETHASONE SODIUM PHOSPHATE 4 MG/ML IJ SOLN: 4 mg/mL | INTRAMUSCULAR | Qty: 1

## 2018-08-29 MED FILL — MELATONIN 3 MG TAB: 3 mg | ORAL | Qty: 1

## 2018-08-29 MED FILL — POTASSIUM CHLORIDE SR 10 MEQ TAB: 10 mEq | ORAL | Qty: 1

## 2018-08-29 MED FILL — NYSTATIN 100,000 UNIT/ML ORAL SUSP: 100000 unit/mL | ORAL | Qty: 5

## 2018-08-29 MED FILL — FUROSEMIDE 20 MG TAB: 20 mg | ORAL | Qty: 1

## 2018-08-29 MED FILL — LEVALBUTEROL 1.25 MG/3 ML SOLN FOR INHALATION: 1.25 mg/3 mL | RESPIRATORY_TRACT | Qty: 3

## 2018-08-29 MED FILL — ENOXAPARIN 40 MG/0.4 ML SUB-Q SYRINGE: 40 mg/0.4 mL | SUBCUTANEOUS | Qty: 0.4

## 2018-08-29 MED FILL — HALOPERIDOL 1 MG TAB: 1 mg | ORAL | Qty: 1

## 2018-08-29 NOTE — Progress Notes (Signed)
Progress Note    Patient: Kayla [name] Age: 77 y.o. Sex: female    Date of Birth: 04/20/1941 Admit Date: 08/17/2018 PCP: Henrene Pastor, MD   MRN: [id number]  CSN: [id number]       Author: Hilton Cork. Jyair Kiraly, MD  Service: [facility] Hospitalists  Pager: [phone]  Date: August 29, 2018    Interval Update:     - Alert but tired and not very verbal  - Chart reviewed  - Prognosis poor  - Check Labs in AM    Lab Trends:     K: 4.2 ? 3.8 ? 3.7 ? 3.6 ? 4.3 ? 4.0 ? 3.8  Cr: 0.7 ? 0.6 ? 0.6 ? 0.8 ? 0.7 ? 0.7 ? 0.6  TBili: 1.9 ? 1.7 ? 2.8  ALT: 61 ? 61 ? 123  AST: 505 ? 491 ? 740  A0: 836 ? 863 ? 881  Lactate: 3.2 ? 3.82 ? 5.0  iCa: 4.6 ? 4.3 ? 4.3 ? 4.5 ? 4.0 ? 4.2 ? 4.3  WBC: 6.3 ? 6.3 ? 4.8 ? 7.1 ? 8.2  Hb: 12.2 ? 10.8 ? 11.2 ? 9.6 ? 10.1 ? 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 Decadron IV, Lasix PO  2. Xopenex NEB, KCl PO  3. melatonin PO, nystatin PO  4. Lovenox SC    Medication List:     dexamethasone (Decadron) 4 mg/ml injection 4 mg IV q6H  enoxaparin (Lovenox) injection 40 mg SC q24H  furosemide (Lasix) tablet 20 mg PO qD  levalbuterol (Xopenex) nebulizer soln 1.25 mg/3 ml NEB BID resp  melatonin tablet 3 mg PO qHS  nystatin (Mycostatin) 100,000 unit/ml oral suspension 500,000 units PO QID  potassium chloride sr (Klor-Co) tablet 10 meq PO qD    acetaminophen (Tylenol) tablet 650 mg PO q6H PRN  guaifenesin (Robitussin) 100 mg/5 ml oral liquid 200 mg PO q4H PRN  haloperidol (Haldol) tablet 1 mg PO bedtime PRN  ibuprofen (Motrin) tablet 400 mg PO q6H PRN  levalbuterol (Xopenex) nebulizer soln 1.25 mg/3 ml NEB q4H PRN    Physical Exam:      VITALS: Afebrile. BP controlled. HR > 100. RR normal. O2 > 95%. Room air. I/O neutral  GENERAL: Appears well. No distress  HEART: Regular rate and rhythm. No S3  LUNGS: Normal effort. CTA. No crackles. No wheezing  ABD: Soft. Non-distended. Non-tender  SKIN: Warm. No significant edema  NEURO: Alert    Lab Results:     10/04: Na 141, K 3.8, CO2 31, AG 11, BUN 24, Cr 0.6, Ca 9.0, Mg 2.2, PO3 2.8  10/04: WBC 8.2, Hb 10.7, Hct 32.5, Plts 171, MCV 88.6  10/02: Na 141, K 4.0, CO2 28, AG 11, BUN 26, Cr 0.7, Ca 8.8, Mg 2.2, PO3 2.5  10/02: WBC 7.1, Hb 10.1, Hct 31.2, Plts 203, MCV 87.9, N% 85.1, L% 1.6  10/01: Na 140, K 4.3, CO2 30, AG 6, BUN 26, Cr 0.7, Ca 8.4, Mg 2.0, PO3 3.0  09/30: pH 7.483, pCO2 32.6, HCO3 24.5, pO2 70.0, O2% 95.0, Room Air  09/23: U/A: SG 1.025, pH 5.5, Prot Trc, Bld -, LE -, NI -  09/23: NH3 37  09/23: NT pro-BNP 151.0    Radiology:     XR CHEST SNGL V (08/24/18 03:09)    1. Mild cardiomegaly.  2. Small bilateral pleural effusions.  3. Multiple pulmonary nodules consistent with metastases.  4. Diffuse osteolytic lesions consistent with bony metastases.    CT ABD PELV W CONT (08/17/18 21:09)    1. 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 Compression fractures L2 through L4. Small acute fracture suspected at superior lateral right acetabular margin.  2. Indeterminate right renal lesion likely represent cyst which could be confirmed with ultrasound.  3. Left hip replacement.  4. Uterus atrophic or removed.  5. Atherosclerotic vascular disease.  6. Constipation.    CT HEAD WO CONT (08/17/18 21:09)    Cerebral atrophy and nonspecific white matter changes not unusual for age; multiple lesions as described in brain and calvarium worrisome for metastatic disease on this noncontrast study. Contrast MRI suggested for further evaluation.    CTA CHEST W OR W WO CONT (08/17/18 21:09)    1. No evidence of pulmonary embolus or aortic dissection.   2. 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  3. Indeterminate lesion right thyroid lobe which could be evaluated with ultrasound.  4. Cardiomegaly.  5. Bilateral small pleural effusions.  6. Multiple lesions throughout lungs and bones consistent with metastatic disease possibly from breast primary with asymmetric multi centric lesions throughout the right breast and overlying skin thickening with asymmetric upper normal size right axillary nodes.  7. Compression fractures T3 and T4 with retropulsion.  8. Heterogeneous liver. Metastatic disease not excluded.    XR CHEST SNGL V (08/17/18 20:09)    Edema versus multifocal pneumonia or chronic changes bilaterally. Cardiomegaly. Postsurgical changes cervical spine.    Cardiology:     EKG, 12 LEAD (08/21/18 06:09)    Sinus tachycardia  Septal infarct , age undetermined  T wave abnormality, consider inferior ischemia  T wave abnormality, consider anterolateral ischemia  Prolonged QT  When compared with ECG of 17-Aug-2018 20:03,  Septal infarct is now present  T wave inversion now evident in Inferior leads  T wave inversion now evident in Anterolateral leads    EKG, 12 LEAD (08/17/18 20:09)    Sinus tachycardia  Moderate voltage criteria for LVH, may be normal variant  No previous ECGs available    Microbiology:     None    Other Data:     None    Admission HPI:     Kayla [name] is a 77 y.o. year old female who presents with decreased PO intake, generalized weakness since being placed on oral chemotherapy last week. Today, patient noted to be confused, lethargic. In ED, Patient awake and oriented to person however not time or place. Patient on presentation, tachycardic, and appeared dehydrated. In ED, Vancomycin and Zosyn initiated.

## 2018-08-29 NOTE — Progress Notes (Signed)
Patient refused her breathing treatment at this time she is on room air, alert and awake

## 2018-08-29 NOTE — Other (Signed)
Bedside shift change report given to Andres, Therapist, sports (Soil scientist) by Hassan Rowan, RN (offgoing nurse). Report included the following information SBAR.

## 2018-08-29 NOTE — Progress Notes (Signed)
Progress Note    Patient: Towanda Hornstein Age: 77 y.o. Sex: female    Date of Birth: 06/05/1941 Admit Date: 08/17/2018 PCP: Henrene Pastor, MD   MRN: [id number]  CSN: [id number]       Author: Hilton Cork. Brinleigh Tew, MD  Service: [facility] Hospitalists  Pager: [phone]  Date: August 29, 2018    Interval Update:     - Alert but tired and not very verbal  - Chart reviewed  - Prognosis poor  - Check Labs in AM    Lab Trends:     K: 4.2 ? 3.8 ? 3.7 ? 3.6 ? 4.3 ? 4.0 ? 3.8  Cr: 0.7 ? 0.6 ? 0.6 ? 0.8 ? 0.7 ? 0.7 ? 0.6  TBili: 1.9 ? 1.7 ? 2.8  ALT: 61 ? 61 ? 123  AST: 505 ? 491 ? 740  A0: 836 ? 863 ? 881  Lactate: 3.2 ? 3.82 ? 5.0  iCa: 4.6 ? 4.3 ? 4.3 ? 4.5 ? 4.0 ? 4.2 ? 4.3  WBC: 6.3 ? 6.3 ? 4.8 ? 7.1 ? 8.2  Hb: 12.2 ? 10.8 ? 11.2 ? 9.6 ? 10.1 ? 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 Decadron IV, Lasix PO  2. Xopenex NEB, KCl PO  3. melatonin PO, nystatin PO  4. Lovenox SC    Medication List:     dexamethasone (Decadron) 4 mg/ml injection 4 mg IV q6H  enoxaparin (Lovenox) injection 40 mg SC q24H  furosemide (Lasix) tablet 20 mg PO qD  levalbuterol (Xopenex) nebulizer soln 1.25 mg/3 ml NEB BID resp  melatonin tablet 3 mg PO qHS  nystatin (Mycostatin) 100,000 unit/ml oral suspension 500,000 units PO QID  potassium chloride sr (Klor-Co) tablet 10 meq PO qD    acetaminophen (Tylenol) tablet 650 mg PO q6H PRN  guaifenesin (Robitussin) 100 mg/5 ml oral liquid 200 mg PO q4H PRN  haloperidol (Haldol) tablet 1 mg PO bedtime PRN  ibuprofen (Motrin) tablet 400 mg PO q6H PRN  levalbuterol (Xopenex) nebulizer soln 1.25 mg/3 ml NEB q4H PRN    Physical Exam:     VITALS: Afebrile. BP controlled. HR > 100. RR normal. O2 > 95%.  Room air. I/O neutral  GENERAL: Appears well. No distress  HEART: Regular rate and rhythm. No S3  LUNGS: Normal effort. CTA. No crackles. No wheezing  ABD: Soft. Non-distended. Non-tender  SKIN: Warm. No significant edema  NEURO: Alert    Lab Results:     10/04: Na 141, K 3.8, CO2 31, AG 11, BUN 24, Cr 0.6, Ca 9.0, Mg 2.2, PO3 2.8  10/04: WBC 8.2, Hb 10.7, Hct 32.5, Plts 171, MCV 88.6  10/02: Na 141, K 4.0, CO2 28, AG 11, BUN 26, Cr 0.7, Ca 8.8, Mg 2.2, PO3 2.5  10/02: WBC 7.1, Hb 10.1, Hct 31.2, Plts 203, MCV 87.9, N% 85.1, L% 1.6  10/01: Na 140, K 4.3, CO2 30, AG 6, BUN 26, Cr 0.7, Ca 8.4, Mg 2.0, PO3 3.0  09/30: pH 7.483, pCO2 32.6, HCO3 24.5, pO2 70.0, O2% 95.0, Room Air  09/23: U/A: SG 1.025, pH 5.5, Prot Trc, Bld -, LE -, NI -  09/23: NH3 37  09/23: NT pro-BNP 151.0    Radiology:     XR CHEST SNGL V (08/24/18 03:09)    1. Mild cardiomegaly.  2. Small bilateral pleural effusions.  3. Multiple pulmonary nodules consistent with metastases.  4. Diffuse osteolytic lesions consistent with bony metastases.    CT ABD PELV W CONT (08/17/18 21:09)    1. 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 Compression fractures L2 through L4. Small acute fracture suspected at superior lateral right acetabular margin.  2. Indeterminate right renal lesion likely represent cyst which could be confirmed with ultrasound.  3. Left hip replacement.  4. Uterus atrophic or removed.  5. Atherosclerotic vascular disease.  6. Constipation.    CT HEAD WO CONT (08/17/18 21:09)    Cerebral atrophy and nonspecific white matter changes not unusual for age; multiple lesions as described in brain and calvarium worrisome for metastatic disease on this noncontrast study. Contrast MRI suggested for further evaluation.    CTA CHEST W OR W WO CONT (08/17/18 21:09)    1. No evidence of pulmonary embolus or aortic dissection.  2. 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  3. Indeterminate lesion right thyroid lobe which  could be evaluated with ultrasound.  4. Cardiomegaly.  5. Bilateral small pleural effusions.  6. Multiple lesions throughout lungs and bones consistent with metastatic disease possibly from breast primary with asymmetric multi centric lesions throughout the right breast and overlying skin thickening with asymmetric upper normal size right axillary nodes.  7. Compression fractures T3 and T4 with retropulsion.  8. Heterogeneous liver. Metastatic disease not excluded.    XR CHEST SNGL V (08/17/18 20:09)    Edema versus multifocal pneumonia or chronic changes bilaterally. Cardiomegaly. Postsurgical changes cervical spine.    Cardiology:     EKG, 12 LEAD (08/21/18 06:09)    Sinus tachycardia  Septal infarct , age undetermined  T wave abnormality, consider inferior ischemia  T wave abnormality, consider anterolateral ischemia  Prolonged QT  When compared with ECG of 17-Aug-2018 20:03,  Septal infarct is now present  T wave inversion now evident in Inferior leads  T wave inversion now evident in Anterolateral leads    EKG, 12 LEAD (08/17/18 20:09)    Sinus tachycardia  Moderate voltage criteria for LVH, may be normal variant  No previous ECGs available    Microbiology:     None    Other Data:     None    Admission HPI:     Addylin Manke is a 77 y.o. year old female who presents with decreased PO intake, generalized weakness since being placed on oral chemotherapy last week. Today, patient noted to be confused, lethargic. In ED, Patient awake and oriented to person however not time or place. Patient on presentation, tachycardic, and appeared dehydrated. In ED, Vancomycin and Zosyn initiated.

## 2018-08-30 LAB — METABOLIC PANEL, COMPREHENSIVE
ALT (SGPT): 239 U/L — ABNORMAL HIGH (ref 12–78)
AST (SGOT): 725 U/L — ABNORMAL HIGH (ref 15–37)
Albumin: 2.4 gm/dl — ABNORMAL LOW (ref 3.4–5.0)
Alk. phosphatase: 1074 U/L — ABNORMAL HIGH (ref 45–117)
Anion gap: 8 mmol/L (ref 5–15)
BUN: 28 mg/dl — ABNORMAL HIGH (ref 7–25)
Bilirubin, total: 5.3 mg/dl — ABNORMAL HIGH (ref 0.2–1.0)
CO2: 33 mEq/L — ABNORMAL HIGH (ref 21–32)
Calcium: 8.8 mg/dl (ref 8.5–10.1)
Chloride: 100 mEq/L (ref 98–107)
Creatinine: 0.6 mg/dl (ref 0.6–1.3)
GFR est AA: >60
GFR est non-AA: >60
Glucose: 131 mg/dl — ABNORMAL HIGH (ref 74–106)
Potassium: 4 mEq/L (ref 3.5–5.1)
Protein, total: 6.1 gm/dl — ABNORMAL LOW (ref 6.4–8.2)
Sodium: 141 mEq/L (ref 136–145)

## 2018-08-30 LAB — CBC WITH AUTOMATED DIFF
BASOPHILS: 0.2 % (ref 0–3)
EOSINOPHILS: 0 % (ref 0–5)
HCT: 32.7 % — ABNORMAL LOW (ref 37.0–50.0)
HGB: 10.8 gm/dl — ABNORMAL LOW (ref 13.0–17.2)
IMMATURE GRANULOCYTES: 2.7 % (ref 0.0–3.0)
LYMPHOCYTES: 1.1 % — ABNORMAL LOW (ref 28–48)
MCH: 28.7 pg (ref 25.4–34.6)
MCHC: 33 gm/dl (ref 30.0–36.0)
MCV: 87 fL (ref 80.0–98.0)
MONOCYTES: 6 % (ref 1–13)
MPV: 11.2 fL — ABNORMAL HIGH (ref 6.0–10.0)
NEUTROPHILS: 90 % — ABNORMAL HIGH (ref 34–64)
NRBC: 1 — ABNORMAL HIGH (ref 0–0)
PLATELET COMMENTS: NORMAL
PLATELET: 145 10*3/uL (ref 140–450)
RBC: 3.76 M/uL (ref 3.60–5.20)
RDW-SD: 65.1 — ABNORMAL HIGH (ref 36.4–46.3)
WBC: 8.3 10*3/uL (ref 4.0–11.0)

## 2018-08-30 LAB — LACTIC ACID
LACTIC ACID: 2.6 mmol/L — ABNORMAL HIGH (ref 0.4–2.0)
Lactic Acid: 2.6 mmol/L — ABNORMAL HIGH (ref 0.4–2.0)

## 2018-08-30 LAB — MAGNESIUM
Magnesium: 2.2 mg/dl (ref 1.6–2.6)
Magnesium: 2.2 mg/dl (ref 1.6–2.6)

## 2018-08-30 LAB — COMPREHENSIVE METABOLIC PANEL
ALT: 239 U/L — ABNORMAL HIGH (ref 12–78)
AST: 725 U/L — ABNORMAL HIGH (ref 15–37)
Albumin: 2.4 gm/dl — ABNORMAL LOW (ref 3.4–5.0)
Alkaline Phosphatase: 1074 U/L — ABNORMAL HIGH (ref 45–117)
Anion Gap: 8 mmol/L (ref 5–15)
BUN: 28 mg/dl — ABNORMAL HIGH (ref 7–25)
CO2: 33 mEq/L — ABNORMAL HIGH (ref 21–32)
Calcium: 8.8 mg/dl (ref 8.5–10.1)
Chloride: 100 mEq/L (ref 98–107)
Creatinine: 0.6 mg/dl (ref 0.6–1.3)
EGFR IF NonAfrican American: >60
GFR African American: >60
Glucose: 131 mg/dl — ABNORMAL HIGH (ref 74–106)
Potassium: 4 mEq/L (ref 3.5–5.1)
Sodium: 141 mEq/L (ref 136–145)
Total Bilirubin: 5.3 mg/dl — ABNORMAL HIGH (ref 0.2–1.0)
Total Protein: 6.1 gm/dl — ABNORMAL LOW (ref 6.4–8.2)

## 2018-08-30 LAB — CBC WITH AUTO DIFFERENTIAL
Basophils %: 0.2 % (ref 0–3)
Eosinophils %: 0 % (ref 0–5)
Hematocrit: 32.7 % — ABNORMAL LOW (ref 37.0–50.0)
Hemoglobin: 10.8 gm/dl — ABNORMAL LOW (ref 13.0–17.2)
Immature Granulocytes: 2.7 % (ref 0.0–3.0)
Lymphocytes %: 1.1 % — ABNORMAL LOW (ref 28–48)
MCH: 28.7 pg (ref 25.4–34.6)
MCHC: 33 gm/dl (ref 30.0–36.0)
MCV: 87 fL (ref 80.0–98.0)
MPV: 11.2 fL — ABNORMAL HIGH (ref 6.0–10.0)
Monocytes %: 6 % (ref 1–13)
Neutrophils %: 90 % — ABNORMAL HIGH (ref 34–64)
Nucleated RBCs: 1 — ABNORMAL HIGH (ref 0–0)
Platelet Comment: NORMAL
Platelets: 145 10*3/uL (ref 140–450)
RBC: 3.76 M/uL (ref 3.60–5.20)
RDW-SD: 65.1 — ABNORMAL HIGH (ref 36.4–46.3)
WBC: 8.3 10*3/uL (ref 4.0–11.0)

## 2018-08-30 MED FILL — DEXAMETHASONE SODIUM PHOSPHATE 4 MG/ML IJ SOLN: 4 mg/mL | INTRAMUSCULAR | Qty: 1

## 2018-08-30 MED FILL — BD POSIFLUSH NORMAL SALINE 0.9 % INJECTION SYRINGE: INTRAMUSCULAR | Qty: 10

## 2018-08-30 MED FILL — NYSTATIN 100,000 UNIT/ML ORAL SUSP: 100000 unit/mL | ORAL | Qty: 5

## 2018-08-30 MED FILL — LEVALBUTEROL 1.25 MG/3 ML SOLN FOR INHALATION: 1.25 mg/3 mL | RESPIRATORY_TRACT | Qty: 3

## 2018-08-30 MED FILL — ENOXAPARIN 40 MG/0.4 ML SUB-Q SYRINGE: 40 mg/0.4 mL | SUBCUTANEOUS | Qty: 0.4

## 2018-08-30 MED FILL — FUROSEMIDE 20 MG TAB: 20 mg | ORAL | Qty: 1

## 2018-08-30 MED FILL — POTASSIUM CHLORIDE SR 10 MEQ TAB: 10 mEq | ORAL | Qty: 1

## 2018-08-30 MED FILL — MELATONIN 3 MG TAB: 3 mg | ORAL | Qty: 1

## 2018-08-30 MED FILL — IBUPROFEN 400 MG TAB: 400 mg | ORAL | Qty: 1

## 2018-08-30 NOTE — Other (Signed)
Bedside shift change report given to Sonia Rogers RN (oncoming nurse) by Paris Clark, RN  (offgoing nurse). Report included the following information SBAR.

## 2018-08-30 NOTE — Progress Notes (Signed)
Adult Malnutrition Guidelines:  SEVERE PROTEIN CALORIE MALNUTRITION IN THE CONTEXT OF ACUTE INJURY/ILLNESS  Weight loss:??14% x??2??months  Energy intake: <50% energy intake compared to estimated energy needs >1 month  Body fat: severe depletion  Muscle mass: severe depletion??  Other considerations: breast CA stage 4, brain mass, mets to liver  ??  Please document??Severe??Protein Calorie Malnutrition??in Problem List if in agreement    NUTRITION FOLLOW UP    Current diet order: DIET NUTRITIONAL SUPPLEMENTS Lunch, Dinner; Ensure [employer]  DIET NUTRITIONAL SUPPLEMENTS Dinner; Optician, dispensing  DIET MECHANICAL SOFT    Vitamin/mineral supplementation: Potassium chloride, NS    Other pertinent meds: Lasix     Current intake: [] N/A- NPO    [] very poor     [x] poor      [] fair      [] good  % Diet Eaten  Avg: 30 %  Min: 0 %  Max: 60 %    Weight:   Last 3 Recorded Weights in this Encounter    08/17/18 1829 08/18/18 0520 08/24/18 0647   Weight: 55.3 kg (122 lb) 57.5 kg (126 lb 12.2 oz) 56.7 kg (125 lb)       BMI: Body mass index is 20.18 kg/m??.    Physical assessment:  ?? Chewing/swallowing issues: Dentures  ?? GI symptoms:    Last Bowel Movement Date: 08/29/18   Stool Appearance: Soft   Abdominal Assessment: Soft   Bowel Sounds: Active   ?? Fluid retention:   Edema  LLE: Trace  RLE: Trace  ?? Skin integrity: Intact   ?? Other:     Biochemical data: ALT/AST/ALK 239/725/1074 liver disease    Assessment of current MNT: Diet appropriate, adequate to meet nutrition needs if po >75% with meals. Supplements to increase calorie/pro intake opportunity??  ??  Nutrition diagnosis:   Severe acute protein calorie malnutrition??related to breast CA stage IV, brain mass, mets to liver noted??as evidenced by??po <50% x > 1 month, wt loss of 14% x 2 months, mod/severe physical wasting??(continues)??  ??  Nutrition recommendation: Continue current MNT??  Recommend initiate alternative nutrition if PO intake continues to be low. Rec. Jev 1.5 at 24m/hr   Provides 1800kcals, 77 g protein and 1632 ml water  ??  Nutrition monitoring:??PO intake, diet tolerance and compliance, wt, BS, CMP, hydration, medical changes  ??  Nutrition goals:??PO >75% with meals/supplements, wt stable through LOS, nutrition related lab values WNL??    Progress towards nutrition goals: []  Met/Ongoing     []  Progressing appropriately       []  Progressing slowly      [x]  Not Progressing    Nutrition level of care:  [] low     [] moderate     [x] high    Code status: DNR      Discharge Placement: Unable to determine at this time(anticipate SNF for short term rehab palliative care seeing pt)    RAlton Revere RMancos 08/30/18

## 2018-08-30 NOTE — Progress Notes (Signed)
Progress Note    Patient: Kayla [name] Age: 77 y.o. Sex: female    Date of Birth: 05/30/1941 Admit Date: 08/17/2018 PCP: Henrene Pastor, MD   MRN: [id number]  CSN: [id number]       Author: Hilton Cork. Deleon Passe, MD  Service: [facility]  Pager: [phone]  Date: August 30, 2018    Interval Update:     - Unchanged  - Prognosis poor  - Will re-discuss hospice care with palliative care tomorrow  - No labs in AM    Lab Trends:     K: 3.6 ? 4.3 ? 4.0 ? 3.8 ? 4.0  Cr: 0.8 ? 0.7 ? 0.7 ? 0.6 ? 0.6  Ca: 9.1 ? 8.4 ? 8.8 ? 9.0 ? 8.8  TBili: 5.3  ALT: 239  AST: 725  A0: 1,074  Lactate: 3.82 ? 5.0 ? 2.6  iCa: 4.3 ? 4.5 ? 4.0 ? 4.2 ? 4.3  WBC: 6.3 ? 6.3 ? 4.8 ? 7.1 ? 8.2 ? 8.3  Hb: 12.2 ? 10.8 ? 11.2 ? 9.6 ? 10.1 ? 10.7 ? 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 Decadron IV, Lasix PO  2. Xopenex NEB, KCl PO  3. melatonin PO, nystatin PO  4. Lovenox SC    Medication List:     dexamethasone (Decadron) 4 mg/ml injection 4 mg IV q6H  enoxaparin (Lovenox) injection 40 mg SC q24H  furosemide (Lasix) tablet 20 mg PO qD  levalbuterol (Xopenex) nebulizer soln 1.25 mg/3 ml NEB BID resp  melatonin tablet 3 mg PO qHS  nystatin (Mycostatin) 100,000 unit/ml oral suspension 500,000 units PO QID  potassium chloride sr (Klor-Co) tablet 10 meq PO qD    acetaminophen (Tylenol) tablet 650 mg PO q6H PRN  guaifenesin (Robitussin) 100 mg/5 ml oral liquid 200 mg PO q4H PRN  haloperidol (Haldol) tablet 1 mg PO bedtime PRN  ibuprofen (Motrin) tablet 400 mg PO q6H PRN  levalbuterol (Xopenex) nebulizer soln 1.25 mg/3 ml NEB q4H PRN    Physical Exam:     VITALS: Afebrile. BP controlled. HR > 100. RR normal. O2 > 95%. Room air.  I/O neutral  GENERAL: Appears well. No distress  HEART: Regular rate and rhythm. No S3  LUNGS: Normal effort. CTA. No crackles. No wheezing  ABD: Soft. Non-distended. Non-tender  SKIN: Warm. No significant edema  NEURO: Alert    Lab Results:     10/06: Na 141, K 4.0, CO2 33, AG 8, BUN 28, Cr 0.6, Ca 8.8, Mg 2.2  10/06: TBili 5.3, ALT 239, AST 725, A0 1,074, TP 6.1, Alb 2.4  10/06: WBC 8.3, Hb 10.8, Hct 32.7, Plts 145, MCV 87.0, N% 90.0, L% 1.1  10/04: Na 141, K 3.8, CO2 31, AG 11, BUN 24, Cr 0.6, Ca 9.0, Mg 2.2, PO3 2.8  10/04: WBC 8.2, Hb 10.7, Hct 32.5, Plts 171, MCV 88.6  10/02: Na 141, K 4.0, CO2 28, AG 11, BUN 26, Cr 0.7, Ca 8.8, Mg 2.2, PO3 2.5  10/02: WBC 7.1, Hb 10.1, Hct 31.2, Plts 203, MCV 87.9, N% 85.1, L% 1.6  10/01: Na 140, K 4.3, CO2 30, AG 6, BUN 26, Cr 0.7, Ca  8.4, Mg 2.0, PO3 3.0  09/30: pH 7.483, pCO2 32.6, HCO3 24.5, pO2 70.0, O2% 95.0, Room Air  09/23: U/A: SG 1.025, pH 5.5, Prot Trc, Bld -, LE -, NI -  09/23: NH3 37  09/23: NT pro-BNP 151.0    Radiology:     XR CHEST SNGL V (08/24/18 03:09)    1. Mild cardiomegaly.  2. Small bilateral pleural effusions.  3. Multiple pulmonary nodules consistent with metastases.  4. Diffuse osteolytic lesions consistent with bony metastases.    CT ABD PELV W CONT (08/17/18 21:09)    1. 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 Compression fractures L2 through L4. Small acute fracture suspected at superior lateral right acetabular margin.  2. Indeterminate right renal lesion likely represent cyst which could be confirmed with ultrasound.  3. Left hip replacement.  4. Uterus atrophic or removed.  5. Atherosclerotic vascular disease.  6. Constipation.    CT HEAD WO CONT (08/17/18 21:09)    Cerebral atrophy and nonspecific white matter changes not unusual for age; multiple lesions as described in brain and calvarium worrisome for metastatic disease on this noncontrast study. Contrast MRI suggested for further evaluation.     CTA CHEST W OR W WO CONT (08/17/18 21:09)    1. No evidence of pulmonary embolus or aortic dissection.  2. 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  3. Indeterminate lesion right thyroid lobe which could be evaluated with ultrasound.  4. Cardiomegaly.  5. Bilateral small pleural effusions.  6. Multiple lesions throughout lungs and bones consistent with metastatic disease possibly from breast primary with asymmetric multi centric lesions throughout the right breast and overlying skin thickening with asymmetric upper normal size right axillary nodes.  7. Compression fractures T3 and T4 with retropulsion.  8. Heterogeneous liver. Metastatic disease not excluded.    XR CHEST SNGL V (08/17/18 20:09)    Edema versus multifocal pneumonia or chronic changes bilaterally. Cardiomegaly. Postsurgical changes cervical spine.    Cardiology:     EKG, 12 LEAD (08/21/18 06:09)    Sinus tachycardia  Septal infarct , age undetermined  T wave abnormality, consider inferior ischemia  T wave abnormality, consider anterolateral ischemia  Prolonged QT  When compared with ECG of 17-Aug-2018 20:03,  Septal infarct is now present  T wave inversion now evident in Inferior leads  T wave inversion now evident in Anterolateral leads    EKG, 12 LEAD (08/17/18 20:09)    Sinus tachycardia  Moderate voltage criteria for LVH, may be normal variant  No previous ECGs available    Microbiology:     None    Other Data:     None    Admission HPI:     Kayla [name] is a 77 y.o. year old female who presents with decreased PO intake, generalized weakness since being placed on oral chemotherapy last week. Today, patient noted to be confused, lethargic. In ED, Patient awake and oriented to person however not time or place. Patient on presentation, tachycardic, and appeared dehydrated. In ED, Vancomycin and Zosyn initiated.

## 2018-08-30 NOTE — Progress Notes (Signed)
Progress Note    Patient: Kayla [name] Age: 77 y.o. Sex: female    Date of Birth: 04-Dec-1940 Admit Date: 08/17/2018 PCP: Henrene Pastor, MD   MRN: [id number]  CSN: [id number]       Author: Hilton Cork. Inas Avena, MD  Service: [facility]  Pager: [phone]  Date: August 30, 2018    Interval Update:     - Unchanged  - Prognosis poor  - Will re-discuss hospice care with palliative care tomorrow  - No labs in AM    Lab Trends:     K: 3.6 ? 4.3 ? 4.0 ? 3.8 ? 4.0  Cr: 0.8 ? 0.7 ? 0.7 ? 0.6 ? 0.6  Ca: 9.1 ? 8.4 ? 8.8 ? 9.0 ? 8.8  TBili: 5.3  ALT: 239  AST: 725  A0: 1,074  Lactate: 3.82 ? 5.0 ? 2.6  iCa: 4.3 ? 4.5 ? 4.0 ? 4.2 ? 4.3  WBC: 6.3 ? 6.3 ? 4.8 ? 7.1 ? 8.2 ? 8.3  Hb: 12.2 ? 10.8 ? 11.2 ? 9.6 ? 10.1 ? 10.7 ? 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 Decadron IV, Lasix PO  2. Xopenex NEB, KCl PO  3. melatonin PO, nystatin PO  4. Lovenox SC    Medication List:     dexamethasone (Decadron) 4 mg/ml injection 4 mg IV q6H  enoxaparin (Lovenox) injection 40 mg SC q24H  furosemide (Lasix) tablet 20 mg PO qD  levalbuterol (Xopenex) nebulizer soln 1.25 mg/3 ml NEB BID resp  melatonin tablet 3 mg PO qHS  nystatin (Mycostatin) 100,000 unit/ml oral suspension 500,000 units PO QID  potassium chloride sr (Klor-Co) tablet 10 meq PO qD    acetaminophen (Tylenol) tablet 650 mg PO q6H PRN  guaifenesin (Robitussin) 100 mg/5 ml oral liquid 200 mg PO q4H PRN  haloperidol (Haldol) tablet 1 mg PO bedtime PRN  ibuprofen (Motrin) tablet 400 mg PO q6H PRN  levalbuterol (Xopenex) nebulizer soln 1.25 mg/3 ml NEB q4H PRN    Physical Exam:     VITALS: Afebrile. BP controlled. HR > 100. RR normal. O2 > 95%. Room air. I/O  neutral  GENERAL: Appears well. No distress  HEART: Regular rate and rhythm. No S3  LUNGS: Normal effort. CTA. No crackles. No wheezing  ABD: Soft. Non-distended. Non-tender  SKIN: Warm. No significant edema  NEURO: Alert    Lab Results:     10/06: Na 141, K 4.0, CO2 33, AG 8, BUN 28, Cr 0.6, Ca 8.8, Mg 2.2  10/06: TBili 5.3, ALT 239, AST 725, A0 1,074, TP 6.1, Alb 2.4  10/06: WBC 8.3, Hb 10.8, Hct 32.7, Plts 145, MCV 87.0, N% 90.0, L% 1.1  10/04: Na 141, K 3.8, CO2 31, AG 11, BUN 24, Cr 0.6, Ca 9.0, Mg 2.2, PO3 2.8  10/04: WBC 8.2, Hb 10.7, Hct 32.5, Plts 171, MCV 88.6  10/02: Na 141, K 4.0, CO2 28, AG 11, BUN 26, Cr 0.7, Ca 8.8, Mg 2.2, PO3 2.5  10/02: WBC 7.1, Hb 10.1, Hct 31.2, Plts 203, MCV 87.9, N% 85.1, L% 1.6  10/01: Na 140, K 4.3, CO2 30, AG 6, BUN 26, Cr 0.7, Ca  8.4, Mg 2.0, PO3 3.0  09/30: pH 7.483, pCO2 32.6, HCO3 24.5, pO2 70.0, O2% 95.0, Room Air  09/23: U/A: SG 1.025, pH 5.5, Prot Trc, Bld -, LE -, NI -  09/23: NH3 37  09/23: NT pro-BNP 151.0    Radiology:     XR CHEST SNGL V (08/24/18 03:09)    1. Mild cardiomegaly.  2. Small bilateral pleural effusions.  3. Multiple pulmonary nodules consistent with metastases.  4. Diffuse osteolytic lesions consistent with bony metastases.    CT ABD PELV W CONT (08/17/18 21:09)    1. 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 Compression fractures L2 through L4. Small acute fracture suspected at superior lateral right acetabular margin.  2. Indeterminate right renal lesion likely represent cyst which could be confirmed with ultrasound.  3. Left hip replacement.  4. Uterus atrophic or removed.  5. Atherosclerotic vascular disease.  6. Constipation.    CT HEAD WO CONT (08/17/18 21:09)    Cerebral atrophy and nonspecific white matter changes not unusual for age; multiple lesions as described in brain and calvarium worrisome for metastatic disease on this noncontrast study. Contrast MRI suggested for further evaluation.    CTA CHEST W OR W WO CONT  (08/17/18 21:09)    1. No evidence of pulmonary embolus or aortic dissection.  2. 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  3. Indeterminate lesion right thyroid lobe which could be evaluated with ultrasound.  4. Cardiomegaly.  5. Bilateral small pleural effusions.  6. Multiple lesions throughout lungs and bones consistent with metastatic disease possibly from breast primary with asymmetric multi centric lesions throughout the right breast and overlying skin thickening with asymmetric upper normal size right axillary nodes.  7. Compression fractures T3 and T4 with retropulsion.  8. Heterogeneous liver. Metastatic disease not excluded.    XR CHEST SNGL V (08/17/18 20:09)    Edema versus multifocal pneumonia or chronic changes bilaterally. Cardiomegaly. Postsurgical changes cervical spine.    Cardiology:     EKG, 12 LEAD (08/21/18 06:09)    Sinus tachycardia  Septal infarct , age undetermined  T wave abnormality, consider inferior ischemia  T wave abnormality, consider anterolateral ischemia  Prolonged QT  When compared with ECG of 17-Aug-2018 20:03,  Septal infarct is now present  T wave inversion now evident in Inferior leads  T wave inversion now evident in Anterolateral leads    EKG, 12 LEAD (08/17/18 20:09)    Sinus tachycardia  Moderate voltage criteria for LVH, may be normal variant  No previous ECGs available    Microbiology:     None    Other Data:     None    Admission HPI:     Kayla [name] is a 77 y.o. year old female who presents with decreased PO intake, generalized weakness since being placed on oral chemotherapy last week. Today, patient noted to be confused, lethargic. In ED, Patient awake and oriented to person however not time or place. Patient on presentation, tachycardic, and appeared dehydrated. In ED, Vancomycin and Zosyn initiated.

## 2018-08-30 NOTE — Progress Notes (Signed)
Adult Malnutrition Guidelines:  SEVERE PROTEIN CALORIE MALNUTRITION IN THE CONTEXT OF ACUTE INJURY/ILLNESS  Weight loss:14% x57months  Energy intake: <50% energy intake compared to estimated energy needs >1 month  Body fat: severe depletion  Muscle mass: severe depletion  Other considerations: breast CA stage 4, brain mass, mets to liver    Please documentSevereProtein Calorie Malnutritionin Problem List if in agreement    NUTRITION FOLLOW UP    Current diet order: DIET NUTRITIONAL SUPPLEMENTS Lunch, Dinner; Ensure [employer]  DIET NUTRITIONAL SUPPLEMENTS Dinner; Biochemist, clinical  DIET MECHANICAL SOFT    Vitamin/mineral supplementation: Potassium chloride, NS    Other pertinent meds: Lasix     Current intake: []  N/A- NPO    []  very poor     [x]  poor      []  fair      []  good  % Diet Eaten  Avg: 30 %  Min: 0 %  Max: 60 %    Weight:   Last 3 Recorded Weights in this Encounter    08/17/18 1829 08/18/18 0520 08/24/18 0647   Weight: 55.3 kg (122 lb) 57.5 kg (126 lb 12.2 oz) 56.7 kg (125 lb)       BMI: Body mass index is 20.18 kg/m.    Physical assessment:   Chewing/swallowing issues: Dentures   GI symptoms:    Last Bowel Movement Date: 08/29/18   Stool Appearance: Soft   Abdominal Assessment: Soft   Bowel Sounds: Active    Fluid retention:   Edema  LLE: Trace  RLE: Trace   Skin integrity: Intact    Other:     Biochemical data: ALT/AST/ALK [id number]/[id number] liver disease    Assessment of current MNT: Diet appropriate, adequate to meet nutrition needs if po >75% with meals. Supplements to increase calorie/pro intake opportunity    Nutrition diagnosis:   Severe acute protein calorie malnutritionrelated to breast CA stage IV, brain mass, mets to liver notedas evidenced bypo <50% x > 1 month, wt loss of 14% x 2 months, mod/severe physical wasting(continues)    Nutrition recommendation: Continue current MNT  Recommend initiate alternative nutrition if PO intake continues to be low. Rec. Jev 1.5 at 38ml/hr  Provides  1800kcals, 77 g protein and 1632 ml water    Nutrition monitoring:PO intake, diet tolerance and compliance, wt, BS, CMP, hydration, medical changes    Nutrition goals:PO >75% with meals/supplements, wt stable through LOS, nutrition related lab values WNL    Progress towards nutrition goals: []   Met/Ongoing     []   Progressing appropriately       []   Progressing slowly      [x]   Not Progressing    Nutrition level of care:  []  low     []  moderate     [x]  high    Code status: DNR      Discharge Placement: Unable to determine at this time(anticipate SNF for short term rehab palliative care seeing pt)    Adonis Huguenin, RD  08/30/18

## 2018-08-31 LAB — MAGNESIUM
Magnesium: 2.2 mg/dl (ref 1.6–2.6)
Magnesium: 2.2 mg/dl (ref 1.6–2.6)

## 2018-08-31 MED FILL — LEVALBUTEROL 1.25 MG/3 ML SOLN FOR INHALATION: 1.25 mg/3 mL | RESPIRATORY_TRACT | Qty: 3

## 2018-08-31 MED FILL — NYSTATIN 100,000 UNIT/ML ORAL SUSP: 100000 unit/mL | ORAL | Qty: 5

## 2018-08-31 MED FILL — BD POSIFLUSH NORMAL SALINE 0.9 % INJECTION SYRINGE: INTRAMUSCULAR | Qty: 10

## 2018-08-31 MED FILL — DEXAMETHASONE SODIUM PHOSPHATE 4 MG/ML IJ SOLN: 4 mg/mL | INTRAMUSCULAR | Qty: 1

## 2018-08-31 MED FILL — FUROSEMIDE 20 MG TAB: 20 mg | ORAL | Qty: 1

## 2018-08-31 MED FILL — POTASSIUM CHLORIDE SR 10 MEQ TAB: 10 mEq | ORAL | Qty: 1

## 2018-08-31 MED FILL — MELATONIN 3 MG TAB: 3 mg | ORAL | Qty: 1

## 2018-08-31 MED FILL — LOVENOX 40 MG/0.4 ML SUBCUTANEOUS SYRINGE: 40 mg/0.4 mL | SUBCUTANEOUS | Qty: 0.4

## 2018-08-31 NOTE — Progress Notes (Signed)
Progress Note    Patient: Kayla [name] Age: 77 y.o. Sex: female    Date of Birth: [birth year] Admit Date: 08/17/2018 PCP: Henrene Pastor, MD   MRN: [id number]  CSN: [id number]       Author: Hilton Cork. Zebulen Simonis, MD  Service: [facility]  Pager: [phone]  Date: August 31, 2018    Interval Update:     - Unchanged clinically  - Underwent treatment #9 of 10 whole brain treatments today  - Family unwilling to consider hospice  - Prognosis very poor    Lab Trends:     K: 3.6 ? 4.3 ? 4.0 ? 3.8 ? 4.0  Cr: 0.8 ? 0.7 ? 0.7 ? 0.6 ? 0.6  Ca: 9.1 ? 8.4 ? 8.8 ? 9.0 ? 8.8  TBili: 5.3  ALT: 239  AST: 725  A0: 1,074  Lactate: 3.82 ? 5.0 ? 2.6  iCa: 4.3 ? 4.5 ? 4.0 ? 4.2 ? 4.3  WBC: 6.3 ? 6.3 ? 4.8 ? 7.1 ? 8.2 ? 8.3  Hb: 12.2 ? 10.8 ? 11.2 ? 9.6 ? 10.1 ? 10.7 ? 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 Decadron IV, Lasix PO  2. Xopenex NEB, KCl PO  3. melatonin PO, nystatin PO  4. Lovenox SC    Medication List:     dexamethasone (Decadron) 4 mg/ml injection 4 mg IV q6H  enoxaparin (Lovenox) injection 40 mg SC q24H  furosemide (Lasix) tablet 20 mg PO qD  levalbuterol (Xopenex) nebulizer soln 1.25 mg/3 ml NEB BID resp  melatonin tablet 3 mg PO qHS  nystatin (Mycostatin) 100,000 unit/ml oral suspension 500,000 units PO QID  potassium chloride sr (Klor-Co) tablet 10 meq PO qD    acetaminophen (Tylenol) tablet 650 mg PO q6H PRN  guaifenesin (Robitussin) 100 mg/5 ml oral liquid 200 mg PO q4H PRN  haloperidol (Haldol) tablet 1 mg PO bedtime PRN  ibuprofen (Motrin) tablet 400 mg PO q6H PRN  levalbuterol (Xopenex) nebulizer soln 1.25 mg/3 ml NEB q4H PRN    Physical Exam:      VITALS: Afebrile. BP controlled. HR > 100. RR normal. O2 > 95%. Room air. I/O neutral  GENERAL: Appears well. No distress  HEART: Regular rate and rhythm. No S3  LUNGS: Normal effort. CTA. No crackles. No wheezing  ABD: Soft. Non-distended. Non-tender  SKIN: Warm. No significant edema  NEURO: Alert    Lab Results:     10/06: Na 141, K 4.0, CO2 33, AG 8, BUN 28, Cr 0.6, Ca 8.8, Mg 2.2  10/06: TBili 5.3, ALT 239, AST 725, A0 1,074, TP 6.1, Alb 2.4  10/06: WBC 8.3, Hb 10.8, Hct 32.7, Plts 145, MCV 87.0, N% 90.0, L% 1.1  10/04: Na 141, K 3.8, CO2 31, AG 11, BUN 24, Cr 0.6, Ca 9.0, Mg 2.2, PO3 2.8  10/04: WBC 8.2, Hb 10.7, Hct 32.5, Plts 171, MCV 88.6  10/02: Na 141, K 4.0, CO2 28, AG 11, BUN 26, Cr 0.7, Ca 8.8, Mg 2.2, PO3 2.5  10/02: WBC 7.1, Hb 10.1, Hct 31.2, Plts 203, MCV 87.9, N% 85.1, L% 1.6  10/01: Na 140, K 4.3, CO2 30, AG 6, BUN  26, Cr 0.7, Ca 8.4, Mg 2.0, PO3 3.0  09/30: pH 7.483, pCO2 32.6, HCO3 24.5, pO2 70.0, O2% 95.0, Room Air  09/23: U/A: SG 1.025, pH 5.5, Prot Trc, Bld -, LE -, NI -  09/23: NH3 37  09/23: NT pro-BNP 151.0    Radiology:     XR CHEST SNGL V (08/24/18 03:09)    1. Mild cardiomegaly.  2. Small bilateral pleural effusions.  3. Multiple pulmonary nodules consistent with metastases.  4. Diffuse osteolytic lesions consistent with bony metastases.    CT ABD PELV W CONT (08/17/18 21:09)    1. 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 Compression fractures L2 through L4. Small acute fracture suspected at superior lateral right acetabular margin.  2. Indeterminate right renal lesion likely represent cyst which could be confirmed with ultrasound.  3. Left hip replacement.  4. Uterus atrophic or removed.  5. Atherosclerotic vascular disease.  6. Constipation.    CT HEAD WO CONT (08/17/18 21:09)    Cerebral atrophy and nonspecific white matter changes not unusual for age; multiple lesions as described in brain and calvarium worrisome for  metastatic disease on this noncontrast study. Contrast MRI suggested for further evaluation.    CTA CHEST W OR W WO CONT (08/17/18 21:09)    1. No evidence of pulmonary embolus or aortic dissection.  2. 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  3. Indeterminate lesion right thyroid lobe which could be evaluated with ultrasound.  4. Cardiomegaly.  5. Bilateral small pleural effusions.  6. Multiple lesions throughout lungs and bones consistent with metastatic disease possibly from breast primary with asymmetric multi centric lesions throughout the right breast and overlying skin thickening with asymmetric upper normal size right axillary nodes.  7. Compression fractures T3 and T4 with retropulsion.  8. Heterogeneous liver. Metastatic disease not excluded.    XR CHEST SNGL V (08/17/18 20:09)    Edema versus multifocal pneumonia or chronic changes bilaterally. Cardiomegaly. Postsurgical changes cervical spine.    Cardiology:     EKG, 12 LEAD (08/21/18 06:09)    Sinus tachycardia  Septal infarct , age undetermined  T wave abnormality, consider inferior ischemia  T wave abnormality, consider anterolateral ischemia  Prolonged QT  When compared with ECG of 17-Aug-2018 20:03,  Septal infarct is now present  T wave inversion now evident in Inferior leads  T wave inversion now evident in Anterolateral leads    EKG, 12 LEAD (08/17/18 20:09)    Sinus tachycardia  Moderate voltage criteria for LVH, may be normal variant  No previous ECGs available    Microbiology:     None    Other Data:     None    Admission HPI:     Kayla [name] is a 77 y.o. year old female who presents with decreased PO intake, generalized weakness since being placed on oral chemotherapy last week. Today, patient noted to be confused, lethargic. In ED, Patient awake and oriented to person however not time or place. Patient on presentation, tachycardic, and appeared dehydrated. In ED, Vancomycin and Zosyn initiated.

## 2018-08-31 NOTE — Progress Notes (Signed)
Problem: Falls - Risk of  Goal: *Absence of Falls  Description  Document Kayla [name] Fall Risk and appropriate interventions in the flowsheet.  Outcome: Progressing Towards Goal  Note:   Fall Risk Interventions:  Mobility Interventions: Bed/chair exit alarm    Mentation Interventions: Bed/chair exit alarm    Medication Interventions: Bed/chair exit alarm, Evaluate medications/consider consulting pharmacy    Elimination Interventions: Bed/chair exit alarm, Call light in reach    History of Falls Interventions: Bed/chair exit alarm         Problem: Patient Education: Go to Patient Education Activity  Goal: Patient/Family Education  Outcome: Progressing Towards Goal

## 2018-08-31 NOTE — Progress Notes (Signed)
Bedside and Verbal shift change report given to Weldon Inches (oncoming nurse) by Durward Mallard, RN (offgoing nurse). Report included the following information SBAR, Kardex, Intake/Output, MAR and Recent Results.

## 2018-08-31 NOTE — Progress Notes (Signed)
physical Therapy treatment    Patient: Kayla [name] (77 y.o. 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  Weight bearing precautions: None      ASSESSMENT :  Based on the objective data described below, the patient presents with  Able to participate with LE exercises, had just transferred OOB to chair with OT   Generalized muscle weakness affecting function   Decreased Strength  Decreased ADL/Functional Activities  Decreased Transfer Abilities  Decreased Ambulation Ability/Technique  Decreased Balance  Increased Pain  Decreased Activity Tolerance.      Patient???s rehabilitation potential is considered to be Good     Recommendations:  Physical Therapy  Discharge Recommendations: SNF, 24 hour supervision for safety, Home with home health PT and TBD, pending progress  Further Equipment Recommendations for Discharge: None        PLAN :  Planned Interventions:  Functional mobility training Gait Training Stair training Balance Training Therapeutic exercises Therapeutic activities Neuro muscular re-education AD training Patient/caregiver education      Frequency/Duration: Patient will be followed by physical therapy 3x / Week x4weeks to address goals.                 Orders reviewed, chart reviewed on [facility].    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 Back pain     ??? Breast cancer [facility])          Patient found: Bedside chair and chair alarm.    Pain Assessment before PT session: Not rated  Pain Location: all over per pt  Pain Assessment after PT session: Not rated  Pain Location:    []           Yes, patient had pain medications  []           No, Patient has not had pain medications  []           Nurse notified    COGNITIVE STATUS:     Mental Status: Oriented to, person, place and confused.  Communication: normal.  Follows commands: intact.  General Cognition: impaired safety and impulsivity.  Hearing: grossly intact.  Vision:  grossly intact.            Functional mobility and balance status:     Pt finished with OT prior to PT, had transferred OOB to chair and did not want to perform transfers again, agreed to exercises        Lower Extremities:  Seated, Glut Set, Hamstring Set, Heel Slide, Long arc quad, Straight leg raise, Hip ab/duction, Ankle pumps, BLE, Assisted, 10 reps and Cueing  Activity Tolerance:   fair    Final Location:   bedside chair, chair alarm, all needs close, agrees to call for assistance and nurse notified     COMMUNICATION/EDUCATION:   Education: Patient, Benefit of activity while hospitalized, Call for assistance, Out of bed 2-3 times/day, Staff assistance with mobility, Changes positions frequently, Sit out of bed for 45-60 minutes or as tolerated, Safety, Functional mobility, Demonstrates adequately, Verbalized understanding, Teaching method, Verbal and Role of PT  Barriers to Learning/Limitations: None    Please refer to care plan and patient education section for further details.    Thank you for this referral.  Luan Pulling, PT, DPT  Pager [phone]

## 2018-08-31 NOTE — Progress Notes (Signed)
Dc plans/ to be determined. Had wanted SNF for rehab palliative care seeing pt. Noted prognosis poor per MD will follow and assist with dc plans as appropriate. Will place updates in edc if SNF is still family choice upon dc.

## 2018-08-31 NOTE — Progress Notes (Signed)
OCCUPATIONAL THERAPY TREATMENT       Patient: Kayla [name] (77 y.o. 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]       Admit date: 08/17/2018  Insurance: Payor: HUMANA MEDICARE / Plan: [place] / Product Type: Managed Care Medicare /      Precautions: Falls, decreased safety awareness, limited verbalization, R axillary wound      Ordered Weight Bearing Status:None      ASSESSMENT:    Based on the objective data described below, the patient presents with     Increased Strength, Increased ADL/Functional Activities, Increased Transfer Abilities, Increased Balance and Increased Activity Tolerance    Progression toward goals:  '[x]'$     Improving appropriately and progressing toward goals   '[x]'$     Improving slowly and progressing toward goals  '[]'$     Not making progress toward goals and plan of care will be adjusted  '[]'$     Patient has met goals.    Patient demonstrated the following: good effort, good motivation and good response     PLAN: Continue OT POC    Patient continues to benefit from skilled intervention to address the above impairments. Continue treatment per established plan of care.    Discharge Recommendations: SNF/24hr care      EDUCATION:     Barriers to Learning/Limitations:  yes;  cognitive, sensory deficits-vision/hearing/speech and altered mental status (i.e.Sedation, Confusion)  Education provided patient on  Benefit of activity while hospitalized, Call for assistance, Out of bed 2-3 times/day, Staff assistance with mobility, Changes positions frequently, Sit out of bed for 45-60 minutes or as tolerated and Safety    SUBJECTIVE:     Patient "oww" when attempting to complete renal hygiene after BM.    OBJECTIVE DATA SUMMARY:     Orders, labs, occupational therapy and chart reviewed on [facility].  Discussed with Mitchell Heir and Evelena Peat, RN at end of session.    Last O2 entry in flow sheets:  O2 Device: Room air (08/31/18 0930)  O2 Flow Rate (L/min): 2 l/min (08/25/18 1233)    Patient found: bed, bed alarm, sheet, brief, chuck pad saturated in urine and feces. patient agreeable to getting OOB .     Pain assessment: None reported  Pain location: N/A     Cognitive:   Mental Status:   Oriented to, person and place  Communication:   impaired, soft spoken and limited verbalization  Attention Span:   fair(15-89mn)  Follows Commands:   intact  General Cognition:   impaired safety and impulsivity  Hearing:   grossly intact  Vision:   grossly intact    Activities of Daily Living:  LB Bathing -  hygiene with wipes and total A, pain with completion   LB Dressing -  total A to doff and donn new brief   Toileting -  adult brief and saturated in urine and feces when arrived. utilizing brief and having staff change it     Mobility:  Supine to sit -  min. assist  Sit to Stand -  min. assist  Stand to Sit -  min. assist    Transfers:  completed bed to bedside chair back to bed and then back to chair in order to clean buttocks. completed all t/f's with min A x1.    Balance:  Static Sitting Balance -  WFLs  Dynamic Sitting Balance -  good-  Static Standing Balance -  poor+  Dynamic Standing Balance -  poor+    Activity Tolerance: fair- and poor+.    Final Location: bedside chair, chair alarm, all needs close, agrees to call for assistance, nurse notified , nursing present and PT came in at end of session to see the patient. CP notified of need for changed sheets .    Schmid Fall Risk: Total Score: 4 (08/31/18 0930)    Alyson T Sipple, OTR/L

## 2018-08-31 NOTE — Progress Notes (Signed)
Palliative Care RN Progress Note  Palliative Care Team is available Monday-Friday 8 AM-4:30 PM       NAME:  Kayla [name]   DOB:   December 01, 1940   MRN:   [id number]     Date/Time Patient Seen:  08/31/2018 10:14 AM    Attending Physician: Tonny Bollman, MD  Primary Care Physician: Henrene Pastor, MD        Palliative Assessment/Plan:     Code Status:  DNR (confirmed by children today)  Goals of Care: Continue current treatment, children understand patients overall prognosis is poor and her only option at this time is to work on increasing her strength/conditiong to potentially be able to tolerate chemotherapy in the next few weeks.  In the meantime they want to focus on PT/OT and increasing her appetite/nutrition.  They also confirmed wanting to change her code status to DNR.      Patient awake trying to use her cell phone, no family present, no changes to [place].  OT/PT working with patient and documenting "Patient???s rehabilitation potential is considered to be Fair d/t medical complexities" and recommend SNF w/ 24/7 supervision      Disposition: TBD, potential rehab upon discharge unless patient declines any further and would need comfort/hospice    Current Capacity for Decision Making:  Awake, but lethargic and confused, not decisional      Advance Care Planning:      Advance Directives:          Medical Power of Attorney: none          Living Will: none          POLST forms:  There is no POLST (Physician orders for life-sustaining treatment) form on file for this patient     No Advance Directive: Surrogate Decision Maker: Kayla [name] (daughter) [phone]; Kayla [name] (daughter) [phone]; Kayla [name] (daughter) [phone]; Kayla [name] (son) [phone]          The following were updated:  Discussed with Attending Physician/Provider: Tonny Bollman, MD   Other Physician (consultant): reviewed chart notes   Nursing: RN  Care Manager/Discharge Planner: n/a today      Aline August RN, BSN, [facility]   Palliative Medicine  [phone] office      The Palliative Medicine team is available Monday to Friday from 8am to 4:30pm at [phone]    Interval History:       HPI:      HPI: Pt is 77 year old female with breast CA followed Dr. Brent General in [place] undergoing systemic hormal therapy with faslodex. Admitted on 08/17/18 for AMS; head/abd/pelv/chest CT in ED worrisome for brain with vasogenic edemia, lung, liver mets; pt undergoing palliative WBR but unable to tolerate lying flat and then later declined to complete simulation mapping to lumbar spine fields per Dr. Callie Fielding; planning for x1 dose of faslodex but overall poor prognosis per [facility] Dr. Rexford Maus. Pertinent comorbidities include HTN.  PTA medications per EMR: Lisinopril, fentanyl TD 42mcg/hr, oxycodone IR 10mg  PO Q4hr PRN.  Social History: 4 children  Religious/Cultural/Spiritual: CHRISTIAN  Insurance:  Payor: Psychologist, prison and probation services / Plan: [place] / Product Type: Managed Care Medicare /    ECOG Performance Status (current): 4  Prior ECOG: 4  Palliative Performance Scale (PPS): 10-20%  Prior PPS: 20-30%  FAST Scale in Dementia: n/a  PC Consulted [id number] by Dr. Nechama Guard  on 9/27  for goals of care, metastatic breast cancer     Review of Systems:   Unable to obtain  due to clinical circumstance      Physical Exam:   Blood pressure 135/71, pulse 91, temperature 97.4 ??F (36.3 ??C), resp. rate 18, height 5\' 6"  (1.676 m), weight 56.7 kg (125 lb), SpO2 98 %.  Body mass index is 20.18 kg/m??.  Wt Readings from Last 3 Encounters:   08/24/18 56.7 kg (125 lb)     Last Bowel Movement: Last Bowel Movement Date: 08/29/18

## 2018-08-31 NOTE — Progress Notes (Signed)
Bedside and Verbal shift change report given to Erica,RN (oncoming nurse) by Jessie,RN (offgoing nurse). Report included the following information SBAR and Kardex.

## 2018-08-31 NOTE — Progress Notes (Signed)
[facility]  Oncology Progress Note  NAME:  Kayla [name], Kayla [name]  SEX:   F  DOB:   01/03/1941  DATE: 08/31/2018  REFERRING PHYSICIAN:    MR#    [id number]  LOCATION: RADIATION ONCOLOGY  BILLING#  [id number]    cc:     The patient today underwent treatment #9 of 10 planned whole brain treatments.  This totals 2700 cGy.  The 10th and final treatment will be delivered tomorrow.      ___________________  Oliver Barre MD  Dictated By: .   JMB  D:08/31/2018 11:37:45  T: 08/31/2018  [id number]

## 2018-08-31 NOTE — Progress Notes (Signed)
 physical Therapy treatment    Patient: Kayla [name] (77 y.o. 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  Weight bearing precautions: None      ASSESSMENT :  Based on the objective data described below, the patient presents with  Able to participate with LE exercises, had just transferred OOB to chair with OT   Generalized muscle weakness affecting function   Decreased Strength  Decreased ADL/Functional Activities  Decreased Transfer Abilities  Decreased Ambulation Ability/Technique  Decreased Balance  Increased Pain  Decreased Activity Tolerance.      Patient's rehabilitation potential is considered to be Good     Recommendations:  Physical Therapy  Discharge Recommendations: SNF, 24 hour supervision for safety, Home with home health PT and TBD, pending progress  Further Equipment Recommendations for Discharge: None        PLAN :  Planned Interventions:  Functional mobility training Gait Training Stair training Balance Training Therapeutic exercises Therapeutic activities Neuro muscular re-education AD training Patient/caregiver education      Frequency/Duration: Patient will be followed by physical therapy 3x / Week x4weeks to address goals.                 Orders reviewed, chart reviewed on [facility].    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 Back pain    .  Breast cancer [facility])          Patient found: Bedside chair and chair alarm.    Pain Assessment before PT session: Not rated  Pain Location: all over per pt  Pain Assessment after PT session: Not rated  Pain Location:    []           Yes, patient had pain medications  []           No, Patient has not had pain medications  []           Nurse notified    COGNITIVE STATUS:     Mental Status: Oriented to, person, place and confused.  Communication: normal.  Follows commands: intact.  General Cognition: impaired safety and impulsivity.  Hearing: grossly intact.  Vision:  grossly intact.            Functional mobility and balance status:     Pt finished with OT prior to PT, had transferred OOB to chair and did not want to perform transfers again, agreed to exercises        Lower Extremities:  Seated, Glut Set, Hamstring Set, Heel Slide, Long arc quad, Straight leg raise, Hip ab/duction, Ankle pumps, BLE, Assisted, 10 reps and Cueing  Activity Tolerance:   fair    Final Location:   bedside chair, chair alarm, all needs close, agrees to call for assistance and nurse notified     COMMUNICATION/EDUCATION:   Education: Patient, Benefit of activity while hospitalized, Call for assistance, Out of bed 2-3 times/day, Staff assistance with mobility, Changes positions frequently, Sit out of bed for 45-60 minutes or as tolerated, Safety, Functional mobility, Demonstrates adequately, Verbalized understanding, Teaching method, Verbal and Role of PT  Barriers to Learning/Limitations: None    Please refer to care plan and patient education section for further details.    Thank you for this referral.  Cleatus Lemond Molt, PT, DPT  Pager [phone]

## 2018-08-31 NOTE — Progress Notes (Signed)
 Palliative Care RN Progress Note  Palliative Care Team is available Monday-Friday 8 AM-4:30 PM       NAME:  Kayla [name]   DOB:   12-Jun-1941   MRN:   [id number]     Date/Time Patient Seen:  08/31/2018 10:14 AM    Attending Physician: Kayla Rogue, MD  Primary Care Physician: [name], Michael A, MD        Palliative Assessment/Plan:     Code Status:  DNR (confirmed by children today)  Goals of Care: Continue current treatment, children understand patients overall prognosis is poor and her only option at this time is to work on increasing her strength/conditiong to potentially be able to tolerate chemotherapy in the next few weeks.  In the meantime they want to focus on PT/OT and increasing her appetite/nutrition.  They also confirmed wanting to change her code status to DNR.      Patient awake trying to use her cell phone, no family present, no changes to [place].  OT/PT working with patient and documenting Patient's rehabilitation potential is considered to be Fair d/t medical complexities and recommend SNF w/ 24/7 supervision      Disposition: TBD, potential rehab upon discharge unless patient declines any further and would need comfort/hospice    Current Capacity for Decision Making:  Awake, but lethargic and confused, not decisional      Advance Care Planning:      Advance Directives:          Medical Power of Attorney: none          Living Will: none          POLST forms:  There is no POLST (Physician orders for life-sustaining treatment) form on file for this patient     No Advance Directive: Surrogate Decision Maker: Kayla [name] (daughter) [phone]; Kayla [name] (daughter) [phone]; Kayla [name] (daughter) [phone]; Kayla [name] (son) [phone]          The following were updated:  Discussed with Attending Physician/Provider: Hassan Rogue, MD   Other Physician (consultant): reviewed chart notes   Nursing: RN  Care Manager/Discharge Planner: n/[name] today      Kayla Brain RN, BSN, Kayla [name]  Palliative  Medicine  [phone] office      The Palliative Medicine team is available Monday to Friday from 8am to 4:30pm at [phone]    Interval History:       HPI:      HPI: Pt is 77 year old female with breast CA followed Kayla [name] in [place] undergoing systemic hormal therapy with faslodex. Admitted on 08/17/18 for AMS; head/abd/pelv/chest CT in ED worrisome for [name] with vasogenic edemia, lung, liver mets; pt undergoing palliative WBR but unable to tolerate lying flat and then later declined to complete simulation mapping to lumbar spine fields per Kayla [name]; planning for x1 dose of faslodex but overall poor prognosis per Kayla [name]. Pertinent comorbidities include HTN.  PTA medications per EMR: Lisinopril, fentanyl TD 50mcg/hr, oxycodone IR 10mg  PO Q4hr PRN.  Social History: 4 children  Religious/Cultural/Spiritual: CHRISTIAN  Insurance:  Payor: [employer] / Plan: [facility] HUMANA MEDICARE / Product Type: Managed Care Medicare /    ECOG Performance Status (current): 4  Prior ECOG: 4  Palliative Performance Scale (PPS): 10-20%  Prior PPS: 20-30%  FAST Scale in Dementia: n/[name]  PC Consulted ab:Kayla [name] by Kayla [name]  on 9/27  for goals of care, metastatic breast cancer     Review of Systems:   Unable to obtain  due to clinical circumstance      Physical Exam:   Blood pressure 135/71, pulse 91, temperature 97.4 F (36.3 C), resp. rate 18, height 5' 6 (1.676 m), weight 56.7 kg (125 lb), SpO2 98 %.  Body mass index is 20.18 kg/m.  Wt Readings from Last 3 Encounters:   08/24/18 56.7 kg (125 lb)     Last Bowel Movement: Last Bowel Movement Date: 08/29/18

## 2018-08-31 NOTE — Progress Notes (Signed)
OCCUPATIONAL THERAPY TREATMENT       Patient: Kayla [name] (77 y.o. 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 ([facility]) [C79.9, C50.919]       Admit date: 08/17/2018  Insurance: Payor: HUMANA MEDICARE / Plan: [facility] HUMANA MEDICARE / Product Type: Managed Care Medicare /      Precautions: Falls, decreased safety awareness, limited verbalization, R axillary wound      Ordered Weight Bearing Status:None      ASSESSMENT:    Based on the objective data described below, the patient presents with     Increased Strength, Increased ADL/Functional Activities, Increased Transfer Abilities, Increased Balance and Increased Activity Tolerance    Progression toward goals:  [x]     Improving appropriately and progressing toward goals   [x]     Improving slowly and progressing toward goals  []     Not making progress toward goals and plan of care will be adjusted  []     Patient has met goals.    Patient demonstrated the following: good effort, good motivation and good response     PLAN: Continue OT POC    Patient continues to benefit from skilled intervention to address the above impairments. Continue treatment per established plan of care.    Discharge Recommendations: SNF/24hr care      EDUCATION:     Barriers to Learning/Limitations:  yes;  cognitive, sensory deficits-vision/hearing/speech and altered mental status (i.e.Sedation, Confusion)  Education provided patient on  Benefit of activity while hospitalized, Call for assistance, Out of bed 2-3 times/day, Staff assistance with mobility, Changes positions frequently, Sit out of bed for 45-60 minutes or as tolerated and Safety    SUBJECTIVE:     Patient "oww" when attempting to complete renal hygiene after BM.    OBJECTIVE DATA SUMMARY:     Orders, labs, occupational therapy and chart reviewed on [facility]. Discussed with Bella Kennedy and Brayton Caves, RN at end of  session.    Last O2 entry in flow sheets:  O2 Device: Room air (08/31/18 0930)  O2 Flow Rate (L/min): 2 l/min (08/25/18 1233)    Patient found: bed, bed alarm, sheet, brief, chuck pad saturated in urine and feces. patient agreeable to getting OOB .     Pain assessment: None reported  Pain location: N/A     Cognitive:   Mental Status:   Oriented to, person and place  Communication:   impaired, soft spoken and limited verbalization  Attention Span:   fair(15-65min)  Follows Commands:   intact  General Cognition:   impaired safety and impulsivity  Hearing:   grossly intact  Vision:   grossly intact    Activities of Daily Living:  LB Bathing -  hygiene with wipes and total A, pain with completion   LB Dressing -  total A to doff and donn new brief   Toileting -  adult brief and saturated in urine and feces when arrived. utilizing brief and having staff change it     Mobility:  Supine to sit -  min. assist  Sit to Stand -  min. assist  Stand to Sit -  min. assist    Transfers:  completed bed to bedside chair back to bed and then back to chair in order to clean buttocks. completed all t/f's with min A x1.    Balance:  Static Sitting Balance -  WFLs  Dynamic Sitting Balance -  good-  Static Standing Balance -  poor+  Dynamic Standing Balance -  poor+    Activity Tolerance: fair- and poor+.    Final Location: bedside chair, chair alarm, all needs close, agrees to call for assistance, nurse notified , nursing present and PT came in at end of session to see the patient. CP notified of need for changed sheets .    Schmid Fall Risk: Total Score: 4 (08/31/18 0930)    Alyson T Sipple, OTR/L

## 2018-08-31 NOTE — Progress Notes (Signed)
Progress Note    Patient: Kayla [name] Age: 77 y.o. Sex: female    Date of Birth: [birth year] Admit Date: 08/17/2018 PCP: Kayla Pastor, MD   MRN: [id number]  CSN: [id number]       Author: Hilton Cork. Huntleigh Doolen, MD  Service: [facility] Hospitalists  Pager: [phone]  Date: August 31, 2018    Interval Update:     - Unchanged clinically  - Underwent treatment #9 of 10 whole brain treatments today  - Family unwilling to consider hospice  - Prognosis very poor    Lab Trends:     K: 3.6 ? 4.3 ? 4.0 ? 3.8 ? 4.0  Cr: 0.8 ? 0.7 ? 0.7 ? 0.6 ? 0.6  Ca: 9.1 ? 8.4 ? 8.8 ? 9.0 ? 8.8  TBili: 5.3  ALT: 239  AST: 725  A0: 1,074  Lactate: 3.82 ? 5.0 ? 2.6  iCa: 4.3 ? 4.5 ? 4.0 ? 4.2 ? 4.3  WBC: 6.3 ? 6.3 ? 4.8 ? 7.1 ? 8.2 ? 8.3  Hb: 12.2 ? 10.8 ? 11.2 ? 9.6 ? 10.1 ? 10.7 ? 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 Decadron IV, Lasix PO  2. Xopenex NEB, KCl PO  3. melatonin PO, nystatin PO  4. Lovenox SC    Medication List:     dexamethasone (Decadron) 4 mg/ml injection 4 mg IV q6H  enoxaparin (Lovenox) injection 40 mg SC q24H  furosemide (Lasix) tablet 20 mg PO qD  levalbuterol (Xopenex) nebulizer soln 1.25 mg/3 ml NEB BID resp  melatonin tablet 3 mg PO qHS  nystatin (Mycostatin) 100,000 unit/ml oral suspension 500,000 units PO QID  potassium chloride sr (Klor-Co) tablet 10 meq PO qD    acetaminophen (Tylenol) tablet 650 mg PO q6H PRN  guaifenesin (Robitussin) 100 mg/5 ml oral liquid 200 mg PO q4H PRN  haloperidol (Haldol) tablet 1 mg PO bedtime PRN  ibuprofen (Motrin) tablet 400 mg PO q6H PRN  levalbuterol (Xopenex) nebulizer soln 1.25 mg/3 ml NEB q4H PRN    Physical Exam:     VITALS: Afebrile. BP controlled. HR > 100. RR  normal. O2 > 95%. Room air. I/O neutral  GENERAL: Appears well. No distress  HEART: Regular rate and rhythm. No S3  LUNGS: Normal effort. CTA. No crackles. No wheezing  ABD: Soft. Non-distended. Non-tender  SKIN: Warm. No significant edema  NEURO: Alert    Lab Results:     10/06: Na 141, K 4.0, CO2 33, AG 8, BUN 28, Cr 0.6, Ca 8.8, Mg 2.2  10/06: TBili 5.3, ALT 239, AST 725, A0 1,074, TP 6.1, Alb 2.4  10/06: WBC 8.3, Hb 10.8, Hct 32.7, Plts 145, MCV 87.0, N% 90.0, L% 1.1  10/04: Na 141, K 3.8, CO2 31, AG 11, BUN 24, Cr 0.6, Ca 9.0, Mg 2.2, PO3 2.8  10/04: WBC 8.2, Hb 10.7, Hct 32.5, Plts 171, MCV 88.6  10/02: Na 141, K 4.0, CO2 28, AG 11, BUN 26, Cr 0.7, Ca 8.8, Mg 2.2, PO3 2.5  10/02: WBC 7.1, Hb 10.1, Hct 31.2, Plts 203, MCV 87.9, N% 85.1, L% 1.6  10/01: Na 140, K 4.3, CO2 30, AG 6, BUN  26, Cr 0.7, Ca 8.4, Mg 2.0, PO3 3.0  09/30: pH 7.483, pCO2 32.6, HCO3 24.5, pO2 70.0, O2% 95.0, Room Air  09/23: U/A: SG 1.025, pH 5.5, Prot Trc, Bld -, LE -, NI -  09/23: NH3 37  09/23: NT pro-BNP 151.0    Radiology:     XR CHEST SNGL V (08/24/18 03:09)    1. Mild cardiomegaly.  2. Small bilateral pleural effusions.  3. Multiple pulmonary nodules consistent with metastases.  4. Diffuse osteolytic lesions consistent with bony metastases.    CT ABD PELV W CONT (08/17/18 21:09)    1. 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 Compression fractures L2 through L4. Small acute fracture suspected at superior lateral right acetabular margin.  2. Indeterminate right renal lesion likely represent cyst which could be confirmed with ultrasound.  3. Left hip replacement.  4. Uterus atrophic or removed.  5. Atherosclerotic vascular disease.  6. Constipation.    CT HEAD WO CONT (08/17/18 21:09)    Cerebral atrophy and nonspecific white matter changes not unusual for age; multiple lesions as described in brain and calvarium worrisome for metastatic disease on this noncontrast study. Contrast MRI suggested for further  evaluation.    CTA CHEST W OR W WO CONT (08/17/18 21:09)    1. No evidence of pulmonary embolus or aortic dissection.  2. 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  3. Indeterminate lesion right thyroid lobe which could be evaluated with ultrasound.  4. Cardiomegaly.  5. Bilateral small pleural effusions.  6. Multiple lesions throughout lungs and bones consistent with metastatic disease possibly from breast primary with asymmetric multi centric lesions throughout the right breast and overlying skin thickening with asymmetric upper normal size right axillary nodes.  7. Compression fractures T3 and T4 with retropulsion.  8. Heterogeneous liver. Metastatic disease not excluded.    XR CHEST SNGL V (08/17/18 20:09)    Edema versus multifocal pneumonia or chronic changes bilaterally. Cardiomegaly. Postsurgical changes cervical spine.    Cardiology:     EKG, 12 LEAD (08/21/18 06:09)    Sinus tachycardia  Septal infarct , age undetermined  T wave abnormality, consider inferior ischemia  T wave abnormality, consider anterolateral ischemia  Prolonged QT  When compared with ECG of 17-Aug-2018 20:03,  Septal infarct is now present  T wave inversion now evident in Inferior leads  T wave inversion now evident in Anterolateral leads    EKG, 12 LEAD (08/17/18 20:09)    Sinus tachycardia  Moderate voltage criteria for LVH, may be normal variant  No previous ECGs available    Microbiology:     None    Other Data:     None    Admission HPI:     Kayla [name] is a 77 y.o. year old female who presents with decreased PO intake, generalized weakness since being placed on oral chemotherapy last week. Today, patient noted to be confused, lethargic. In ED, Patient awake and oriented to person however not time or place. Patient on presentation, tachycardic, and appeared dehydrated. In ED, Vancomycin and Zosyn initiated.

## 2018-08-31 NOTE — Progress Notes (Signed)
[place]  Oncology Progress Note  NAME:  Kayla [name], Kayla [name]  SEX:   F  DOB:   Sep 04, 1941  DATE: 08/31/2018  REFERRING PHYSICIAN:    MR#    [id number]  LOCATION: RADIATION ONCOLOGY  BILLING#  [id number]    cc:    The patient today underwent treatment #9 of 10 planned whole brain treatments.  This totals 2700 cGy.  The 10th and final treatment will be delivered tomorrow.      ___________________  Oliver Barre MD  Dictated By: .   JMB  D:08/31/2018 11:37:45  T: 08/31/2018  [id number]

## 2018-09-01 LAB — MAGNESIUM
Magnesium: 2.2 mg/dl (ref 1.6–2.6)
Magnesium: 2.2 mg/dl (ref 1.6–2.6)

## 2018-09-01 MED ORDER — LEVALBUTEROL 1.25 MG/3 ML SOLN FOR INHALATION
1.25 mg/3 mL | Freq: Four times a day (QID) | RESPIRATORY_TRACT | Status: DC | PRN
Start: 2018-09-01 — End: 2018-09-03

## 2018-09-01 MED FILL — NYSTATIN 100,000 UNIT/ML ORAL SUSP: 100000 unit/mL | ORAL | Qty: 5

## 2018-09-01 MED FILL — LEVALBUTEROL 1.25 MG/3 ML SOLN FOR INHALATION: 1.25 mg/3 mL | RESPIRATORY_TRACT | Qty: 3

## 2018-09-01 MED FILL — DEXAMETHASONE SODIUM PHOSPHATE 4 MG/ML IJ SOLN: 4 mg/mL | INTRAMUSCULAR | Qty: 1

## 2018-09-01 MED FILL — POTASSIUM CHLORIDE SR 10 MEQ TAB: 10 mEq | ORAL | Qty: 1

## 2018-09-01 MED FILL — FUROSEMIDE 20 MG TAB: 20 mg | ORAL | Qty: 1

## 2018-09-01 MED FILL — ENOXAPARIN 40 MG/0.4 ML SUB-Q SYRINGE: 40 mg/0.4 mL | SUBCUTANEOUS | Qty: 0.4

## 2018-09-01 MED FILL — MELATONIN 3 MG TAB: 3 mg | ORAL | Qty: 1

## 2018-09-01 NOTE — Progress Notes (Addendum)
Kayla [name] Daughter Primary Decision Maker  [phone]    Called dtr about SNF. Waiting to see whats going to happen as far as her care and needs go  last radiation is today per dtr. Dtr may want home.she wants to review list of accepting SNFs she is aware of those that accepted she is looking at [facility] and [place] [facility] [place]. She is aware we will need auth and that will be asked for of SNF once she chooses. She is aware of possible dc in next few days. She will call CM back with her choice.PAGER ID: [phone]   MESSAGE: Dr Nira Retort on [facility] in [phone] got your message about dc soon. Gave dtr list of accepting SNFs she will choose and then let CM know. we will need auth when do you anticipate dc date? [name] [phone]  dtr called back and chose [facility]> placed info in edc and asked them to start auth. Aware short term rehab,/

## 2018-09-01 NOTE — Progress Notes (Signed)
Hospitalist Progress Note  Kristen Loader, MD  [facility] Hospitalists    Daily Progress Note: 09/01/2018    Assessment/Plan:     Vasogenic cerebral edema??from??brain mass??  Suspected to be metastatic breast cancer  Pt has metabolic encephalopathy from this  Pt is on Decadron 4mg  IV q6h, but will change to PO  Pt completed a 10 fraction course (3000 cGy) of XRT Mon-Friday (ended 10/8)  Dr Joylene Draft was following and noted that he offered additional XRT to different areas (spine and other areas), but the pt declined further XRT.  She looks better than on admission per the family  Prognosis is poor though  ??  Dehydration  Rehydrated.??    Dyspnea  Probably mostly metastatic dz as CxR did not show CHF/edema and her lungs are clear on exam.  Pt did respond to Lasix though so there could be some CHF contribution  Added Lasix 20mg  daily (10/1)  ??  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   Family is taking things day by day and want to see what options are available  Per Dr Olene Floss note on 9/27, it looks like it will be just hormonal therapy and XRT.  Onc noted prognosis is poor.  Dr Rexford Maus gave Faslodex on 9/26 and the plan is to continue injections q2 weeks??  I spoke with Dr. Aldona Bar of oncology (10/1) who noted that the patient's performance status is poor and until she can get better, and the patient can go to outpatient follow-up with Dr. Brent General, he noted that the patient would not be eligible for any sort of chemotherapy or immunotherapy at this time.    He noted that the patient is only able to tolerate hormonal therapy but also only if she can make it to the outpt appointments.  ??  Hypercalcemia  S/p Bisphophonate.  Ca coming down and is with some hypocalcemia  ??  SIRS  Sepsis ruled out  ??  Severe protein calorie malnutrition??   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  Pt was evaluated by Inpt wound care RN who noted a Right axillary indurated, discolored site measuring 3 x 3 cm.  There is a full-thickness opening. She recommended daily cleansing of the area. If drainage noted, a Mepilex could be applied.  If the site becomes odorous they would recommend daily Dakin's 1/4 strength dampened gauze    Generalized weakness  PT/OT re-ordered  ??  Insomnia  Melatonin for sleep  ??  Acute hypoxemic respiratory failure  Transiently noted on 9/30  Thought to be due to pulmonary edema as the pt responded to IV Lasix  Pt required hi flow O2 transiently  Resolved    Code status. DNR    Disposition  Family aware pt is doing poorly, but would like to see if pt can bounce back and get strong enough to get Chemo or Immunotherapy  Pt's XRT ended 10/8  Perhaps placement to SNF if pt able to get ins auth and acceptance at a facility  Will see what CM can find      ------------------    36 minutes were spent on the patient of which more than 50% was spent in coordination of care and counseling (time spent with patient/family  face to face, physical exam, reviewing laboratory and imaging investigations, speaking with physicians and nursing staff involved in this patient's care)    ------------------  Physical exam:  General: Sleepy, NAD.  Cooperative.   HEENT: Sclera anicteric, PERRL, OM moist, throat clear.  Chest:  CTA B, no wheeze, no rales, no rhonchi.  Good air movement.  CV: RRR, S1/S2 were normal, no murmur  Abdomen: NTND, soft, NABS, no masses were noted.  No rebound, no guarding.  Extremities: No edema.    Subjective:   No family at bedside. Said she was "ok", no CP, no SOB, no abd pain. She said she did not eat anything. Pt said she was cold and was asking if she could go home    Objective:     Visit Vitals   BP 136/73 (BP 1 Location: Left arm, BP Patient Position: Supine)   Pulse 98   Temp 98.2 ??F (36.8 ??C)   Resp 16   Ht 5\' 6"  (1.676 m)   Wt 56.7 kg (125 lb)   SpO2 100%   BMI 20.18 kg/m??    O2 Flow Rate (L/min): 2 l/min O2 Device: Room air    Temp (24hrs), Avg:97.7 ??F (36.5 ??C), Min:97.4 ??F (36.3 ??C), Max:98.2 ??F (36.8 ??C)    10/08 0701 - 10/08 1900  In: 200 [P.O.:200]  Out: -    10/06 1901 - 10/08 0700  In: 240 [P.O.:240]  Out: -       Data Review:       Recent Days:  Recent Labs     08/30/18  0658   WBC 8.3   HGB 10.8*   HCT 32.7*   PLT 145     Recent Labs     09/01/18  0724 08/31/18  1233 08/30/18  0658   NA  --   --  141   K  --   --  4.0   CL  --   --  100   CO2  --   --  33*   GLU  --   --  131*   BUN  --   --  28*   CREA  --   --  0.6   CA  --   --  8.8   MG 2.2 2.2 2.2   ALB  --   --  2.4*   TBILI  --   --  5.3*   SGOT  --   --  725*   ALT  --   --  239*     No results for input(s): PH, PCO2, PO2, HCO3, FIO2 in the last 72 hours.    24 Hour Results:  Recent Results (from the past 24 hour(s))   MAGNESIUM    Collection Time: 09/01/18  7:24 AM   Result Value Ref Range    Magnesium 2.2 1.6 - 2.6 mg/dl       Xr Chest Sngl V    Result Date: 08/24/2018  INDICATION: Wheezing, SOB   EXAMINATION: XR CHEST SNGL V COMPARISON: 08/17/2018 FINDINGS: Mild cardiomegaly.. Small bilateral pleural effusions. Multiple focal lung lesions bilaterally. Diffuse osteolytic bony changes..        IMPRESSION: 1. Mild cardiomegaly. 2. Small bilateral pleural effusions. 3. Multiple pulmonary nodules consistent with metastases. 4. Diffuse osteolytic lesions consistent with bony metastases.     Xr Chest Sngl V    Result Date: 08/17/2018  Indication: Short of breath Frontal view chest Heart mildly enlarged. Shallow inspiration with interstitial and patchy  alveolar densities throughout the lungs. Anterior cervical fusion at 3 adjacent lower cervical levels. Degenerative narrowing and sclerosis bilateral humeral joint. This rotated to the right.      IMPRESSION: Edema versus multifocal pneumonia or chronic changes bilaterally. Cardiomegaly. Postsurgical changes cervical spine.     Ct Head Wo Cont    Result Date: 08/17/2018  DICOM format image data is available to nonaffiliated external healthcare facilities or entities on a secure, media free, reciprocally searchable basis with patient authorization for 12 months following the date of the study. Indication: Altered mental status Technique:  5 millimeter axial images without contrast were obtained through the brain. Comparison: none Findings:   There is generalized moderate cerebral atrophy.  Low attenuation surrounds the lateral ventricles. There is a rounded 16 x 14 mm mass in the right thalamus surrounding low-density edema. The mass is hyperdense peripherally and low density centrally. There is also focal low-density in the right parietal white matter suggesting vasogenic edema. Focal low-density seen more laterally in the right parietal cortex measuring 2.2 cm diameter. Apparent 2.6 cm x 2.2 cm mass is seen in the right cerebellum with surrounding low-density suggesting vasogenic edema. Asymmetric 11 mm low-density irregular lytic lesion is seen in the diploic space of the right parietal bone. No hemorrhage seen. Sella, pineal, craniocervical junction, orbits, paranasal sinuses, and temporal bones are unremarkable.      Impression: cerebral atrophy and nonspecific white matter changes not unusual for age; multiple lesions as described in brain and calvarium worrisome for metastatic disease on this noncontrast study. Contrast MRI suggested for further evaluation.     Cta Chest W Or W Wo Cont    Result Date: 08/17/2018  DICOM format image data is available to nonaffiliated external healthcare facilities or entities on a secure, media free, reciprocally searchable basis with patient authorization for 12 months following the date of the study. Indication: Altered mental status Technique: 3 millimeter axial  images with IV contrast obtained from lung apices to bases. 3-D MIP CTA images obtained. Comparison: Frontal view chest 08/17/2018 Findings:  Mediastinum: No aortic dissection or pulmonary embolus. Calcified aortic arch and mild coronary calcifications. 10 mm low-density focus right thyroid lobe. Mild cardiomegaly. Small bilateral pleural effusions. Lungs: Numerous nodules of varying sizes throughout the lungs largest of which left lower lobe measures 18 x 19 mm. Focal pleural thickening adjacent to rib destruction right lower lobe posterolaterally. Bones/ chest wall: Extensive lytic lesions throughout the skeletal system of the thorax including sternum, spine, and ribs. High-grade compression fractures T3 and T4 with mild retropulsion. Anterior cervical fusion. Multiple masses are seen throughout the right breast the largest of which is partially imaged laterally at 6.8 x 3.5 cm; there is overlying right breast skin thickening. Asymmetric upper normal size nodes are seen in the right axilla. Below the diaphragm liver is heterogeneously low in density.     Impression: 1. No evidence of pulmonary embolus or aortic dissection. 2. 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 3. Indeterminate lesion right thyroid lobe which could be evaluated with ultrasound. 4. Cardiomegaly. 5. Bilateral small pleural effusions. 6. Multiple lesions throughout lungs and bones consistent with metastatic disease possibly from breast primary with asymmetric multi centric lesions throughout the right breast and overlying skin thickening with asymmetric upper normal size right axillary nodes. 7. Compression fractures T3 and T4 with retropulsion. 8. Heterogeneous liver. Metastatic disease not excluded.     Ct Abd Pelv W Cont    Result Date: 08/17/2018  DICOM  format image data is available to nonaffiliated external healthcare facilities or entities on a secure, media free, reciprocally searchable  basis with patient authorization for 12 months following the date of the study. TECHNIQUE: CT of the abdomen and pelvis WITH intravenous contrast.  The abdomen and pelvis were scanned utilizing a multidetector helical scanner from the diaphragm to the lesser trochanter without the oral administration of barium.  Coronal and sagittal reformations were obtained. COMPARISON: none INDICATION: Altered mental status DISCUSSION: HEPATOBILIARY: Multiple low-density ill-defined lesions throughout the liver with mild hepatomegaly. Gallbladder mildly thick-walled but otherwise unremarkable. SPLEEN: No splenomegaly or focal lesion. PANCREAS: Atrophic. ADRENALS: No adrenal nodules. KIDNEYS/URETERS: 17 mm rounded low-density right lower renal pole cystic cyst without hydronephrosis. PELVIC ORGANS/BLADDER: Uterus atrophic or removed. Extensive artifact in pelvis due to right hip replacement. PERITONEUM / RETROPERITONEUM: No free air or fluid. LYMPH NODES: No lymphadenopathy. VESSELS: Heavy calcification in nondilated aorta and iliac vessels. GI TRACT: No distention or wall thickening. No evidence of appendicitis. Large amount retained fecal material in colon and rectum. BONES AND SOFT TISSUES: Extensive lytic destruction of the bony structures throughout spine and pelvis with left hip replacement. Multiple compression fractures of vertebral bodies moderate grade at L3 and low-grade at L2 and L4 without significant retropulsion. Small fracture right superior acetabular margin laterally.     IMPRESSION: 1. 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 Compression fractures L2 through L4. Small acute fracture suspected at superior lateral right acetabular margin. 2. Indeterminate right renal lesion likely represent cyst which could be confirmed with ultrasound. 3. Left hip replacement. 4. Uterus atrophic or removed. 5. Atherosclerotic vascular disease. 6. Constipation.       Microbiology:  All Micro Results     None            Cardiology:  Results for orders placed or performed during the hospital encounter of 08/17/18   EKG, 12 LEAD, INITIAL   Result Value Ref Range    Ventricular Rate 120 BPM    Atrial Rate 120 BPM    P-R Interval 150 ms    QRS Duration 84 ms    Q-T Interval 384 ms    QTC Calculation (Bezet) 542 ms    Calculated P Axis 73 degrees    Calculated R Axis 0 degrees    Calculated T Axis -97 degrees    Diagnosis        Poor data quality, interpretation may be adversely affected  Sinus tachycardia  Septal infarct , age undetermined  T wave abnormality, consider inferior ischemia  T wave abnormality, consider anterolateral ischemia  Prolonged QT  When compared with ECG of 17-Aug-2018 20:03,  Septal infarct is now present  T wave inversion now evident in Inferior leads  T wave inversion now evident in Anterolateral leads  Confirmed by Marrion Coy, M.D., Ellin Saba. ([id number]) on 08/21/2018 11:43:51 AM           Problem List:  Problem List as of 09/01/2018 Never Reviewed          Codes Class Noted - Resolved    Lactic acidosis ICD-10-CM: E87.2  ICD-9-CM: 276.2  08/17/2018 - Present        Altered mental status ICD-10-CM: R41.82  ICD-9-CM: 780.97  08/17/2018 - Present        Metastasis from breast cancer [facility]) ICD-10-CM: C79.9, C50.919  ICD-9-CM: 199.1, 174.9  08/17/2018 - Present              Medications reviewed  Current Facility-Administered  Medications   Medication Dose Route Frequency   ??? levalbuterol (XOPENEX) nebulizer soln 1.25 mg/3 mL  1.25 mg Nebulization Q6H PRN   ??? guaiFENesin (ROBITUSSIN) 100 mg/5 mL oral liquid 200 mg  200 mg Oral Q4H PRN   ??? furosemide (LASIX) tablet 20 mg  20 mg Oral DAILY   ??? potassium chloride SR (KLOR-CON 10) tablet 10 mEq  10 mEq Oral DAILY   ??? ibuprofen (MOTRIN) tablet 400 mg  400 mg Oral Q6H PRN   ??? nystatin (MYCOSTATIN) 100,000 unit/mL oral suspension 500,000 Units  500,000 Units Oral QID   ??? levalbuterol (XOPENEX) nebulizer soln 1.25 mg/3 mL  1.25 mg Nebulization Q4H PRN    ??? melatonin tablet 3 mg  3 mg Oral QHS   ??? haloperidol (HALDOL) tablet 1 mg  1 mg Oral QHS PRN   ??? sodium chloride (NS) flush 5-10 mL  5-10 mL IntraVENous Q8H   ??? sodium chloride (NS) flush 5-10 mL  5-10 mL IntraVENous PRN   ??? naloxone (NARCAN) injection 0.1 mg  0.1 mg IntraVENous PRN   ??? acetaminophen (TYLENOL) tablet 650 mg  650 mg Oral Q6H PRN   ??? enoxaparin (LOVENOX) injection 40 mg  40 mg SubCUTAneous Q24H   ??? influenza vaccine 2019-20 (4 yrs+)(PF) (FLUCELVAX QUAD) injection 0.5 mL  0.5 mL IntraMUSCular PRIOR TO DISCHARGE   ??? dexamethasone (DECADRON) 4 mg/mL injection 4 mg  4 mg IntraVENous Q6H         Kristen Loader, MD

## 2018-09-01 NOTE — Other (Signed)
Bedside shift change report given to Lina (oncoming nurse) by Erica (offgoing nurse). Report included the following information SBAR.

## 2018-09-01 NOTE — Progress Notes (Signed)
Placed updates in edc for SNF to view. Including PT OT notes. Last radiation is noted to be today. Family will need to choose SNF palliative also seeing pt.

## 2018-09-01 NOTE — Progress Notes (Signed)
Problem: Falls - Risk of  Goal: *Absence of Falls  Description  Document Kayla [name] Fall Risk and appropriate interventions in the flowsheet.   Outcome: Progressing Towards Goal  Note:   Fall Risk Interventions:  Mobility Interventions: Assess mobility with egress test, Bed/chair exit alarm, Patient to call before getting OOB, Strengthening exercises (ROM-active/passive)    Mentation Interventions: Adequate sleep, hydration, pain control, Bed/chair exit alarm, Evaluate medications/consider consulting pharmacy, More frequent rounding, Reorient patient, Toileting rounds    Medication Interventions: Bed/chair exit alarm, Assess postural VS orthostatic hypotension, Evaluate medications/consider consulting pharmacy, Patient to call before getting OOB, Teach patient to arise slowly    Elimination Interventions: Bed/chair exit alarm, Call light in reach, Patient to call for help with toileting needs, Toileting schedule/hourly rounds    History of Falls Interventions: Bed/chair exit alarm, Door open when patient unattended

## 2018-09-01 NOTE — Other (Signed)
Bedside shift change report given to Maria Nena D Snak Rn   (oncoming nurse) by Catalina Rn (offgoing nurse). Report included the following information SBAR, Kardex, Intake/Output, MAR and Recent Results.

## 2018-09-01 NOTE — Discharge Summary (Signed)
[facility]  Oncology Completion Report  NAMEMarland Kitchen [name], Kayla [name]  MR#    [id number]  [place] LOCATION: RADIATION ONCOLOGY  DOB: 10-30-1941  CCLIST: cc: Annette Stable M.D., Aldona Bar M.D.  TREATMENT INITIATED: 08/17/2018  TREAMENT COMPLETED:     REFERRING PHYSICIANS:  Dr. Annette Stable, Dr. 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    SOURCE OF RADIATION:  External beam 6 mV photons.    DATE TREATMENT STARTED:   08/19/18     DATE TREATMENT COMPLETED:  09/01/18     FIELD DESCRIPTION:  Treatment field covered the whole brain.    TECHNIQUE:  Right and left opposed 3-dimensional photon portals.    DOSE:  3000 cGy in 10 fractions.    REMARKS:  The patient tolerated her palliative whole brain radiation therapy for metastatic breast cancer very well.  She remained an inpatient for medical management throughout her treatment.  Although she does have other sites of metastatic disease, she declined simulation for treatment to her spine and other spots.  This was discussed on multiple occasions with the patient and her family.  Arrangements are being made for possible skilled nursing placement.    I thank the referring physicians for allowing me to have participated in the care of this very pleasant woman.      ___________________  Oliver Barre MD  Dictated By: .   MLT  D:09/01/2018 14:30:52  T: 09/01/2018 15:27:45  [id number]

## 2018-09-01 NOTE — Progress Notes (Signed)
Kayla [name] Daughter Primary Decision Maker  [phone]    Called dtr about SNF. Waiting to see whats going to happen as far as her care and needs go  last radiation is today per dtr. Dtr may want home.she wants to review list of accepting SNFs she is aware of those that accepted she is looking at [facility] and [facility] and [facility]. She is aware we will need auth and that will be asked for of SNF once she chooses. She is aware of possible dc in next few days. She will call CM back with her choice.PAGER ID: [phone]   MESSAGE: Dr Maryelizabeth Kaufmann on [facility] in [phone] got your message about dc soon. Gave dtr list of accepting SNFs she will choose and then let CM know. we will need auth when do you anticipate dc date? [name] [phone]  dtr called back and chose [facility]> placed info in edc and asked them to start auth. Aware short term rehab,/

## 2018-09-01 NOTE — Discharge Summary (Signed)
[facility]  Oncology Completion Report  NAMEMarland Kitchen [name], Kayla [name]  MR#    [id number]  [place] LOCATION: RADIATION ONCOLOGY  DOB: October 11, 1941  CCLIST: cc: Annette Stable M.D., Aldona Bar M.D.  TREATMENT INITIATED: 08/17/2018  TREAMENT COMPLETED:     REFERRING PHYSICIANS:  Dr. Annette Stable, Dr. 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    SOURCE OF RADIATION:  External beam 6 mV photons.    DATE TREATMENT STARTED:   08/19/18     DATE TREATMENT COMPLETED:  09/01/18     FIELD DESCRIPTION:  Treatment field covered the whole brain.    TECHNIQUE:  Right and left opposed 3-dimensional photon portals.    DOSE:  3000 cGy in 10 fractions.    REMARKS:  The patient tolerated her palliative whole brain radiation therapy for metastatic breast cancer very well.  She remained an inpatient for medical management throughout her treatment.  Although she does have other sites of metastatic disease, she declined simulation for treatment to her spine and other spots.  This was discussed on multiple occasions with the patient and her family.  Arrangements are being made for possible skilled nursing placement.    I thank the referring physicians for allowing me to have participated in the care of this very pleasant woman.      ___________________  Oliver Barre MD  Dictated By: .   MLT  D:09/01/2018 14:30:52  T: 09/01/2018 15:27:45  [id number]

## 2018-09-01 NOTE — Progress Notes (Signed)
Hospitalist Progress Note  Kristen Loader, MD  [facility] Hospitalists    Daily Progress Note: 09/01/2018    Assessment/Plan:     Vasogenic cerebral edema??from??brain mass??  Suspected to be metastatic breast cancer  Pt has metabolic encephalopathy from this  Pt is on Decadron 4mg  IV q6h, but will change to PO  Pt completed a 10 fraction course (3000 cGy) of XRT Mon-Friday (ended 10/8)  Dr Joylene Draft was following and noted that he offered additional XRT to different areas (spine and other areas), but the pt declined further XRT.  She looks better than on admission per the family  Prognosis is poor though  ??  Dehydration  Rehydrated.??    Dyspnea  Probably mostly metastatic dz as CxR did not show CHF/edema and her lungs are clear on exam.  Pt did respond to Lasix though so there could be some CHF contribution  Added Lasix 20mg  daily (10/1)  ??  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   Family is taking things day by day and want to see what options are available  Per Dr Olene Floss note on 9/27, it looks like it will be just hormonal therapy and XRT.  Onc noted prognosis is poor.  Dr Rexford Maus gave Faslodex on 9/26 and the plan is to continue injections q2 weeks??  I spoke with Dr. Aldona Bar of oncology (10/1) who noted that the patient's performance status is poor and until she can get better, and the patient can go to outpatient follow-up with Dr. Brent General, he noted that the patient would not be eligible for any sort of chemotherapy or immunotherapy at this time.    He noted that the patient is only able to tolerate hormonal therapy but also only if she can make it to the outpt appointments.  ??  Hypercalcemia  S/p Bisphophonate.  Ca coming down and is with some hypocalcemia  ??  SIRS  Sepsis ruled out  ??  Severe protein calorie malnutrition??  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  Pt was evaluated by Inpt wound care RN who noted a Right axillary indurated, discolored site measuring 3 x 3 cm.  There is a full-thickness opening. She recommended daily cleansing of the area. If drainage noted, a Mepilex could be applied.  If the site becomes odorous they would recommend daily Dakin's 1/4 strength dampened gauze    Generalized weakness  PT/OT re-ordered  ??  Insomnia  Melatonin for sleep  ??  Acute hypoxemic respiratory failure  Transiently noted on 9/30  Thought to be due to pulmonary edema as the pt responded to IV Lasix  Pt required hi flow O2 transiently  Resolved    Code status. DNR    Disposition  Family aware pt is doing poorly, but would like to see if pt can bounce back and get strong enough to get Chemo or Immunotherapy  Pt's XRT ended 10/8  Perhaps placement to SNF if pt able to get ins auth and acceptance at a facility  Will see what CM can find      ------------------    36 minutes were spent on the patient of which more than 50% was spent in coordination of care and counseling (time spent with patient/family  face to face, physical exam, reviewing laboratory and imaging investigations, speaking with physicians and nursing staff involved in this patient's care)    ------------------  Physical exam:  General: Sleepy, NAD.  Cooperative.   HEENT: Sclera anicteric, PERRL, OM moist, throat clear.  Chest:  CTA B, no wheeze, no rales, no rhonchi.  Good air movement.  CV: RRR, S1/S2 were normal, no murmur  Abdomen: NTND, soft, NABS, no masses were noted.  No rebound, no guarding.  Extremities: No edema.    Subjective:   No family at bedside. Said she was "ok", no CP, no SOB, no abd pain. She said she did not eat anything. Pt said she was cold and was asking if she could go home    Objective:     Visit Vitals  BP 136/73 (BP 1 Location: Left arm, BP Patient Position: Supine)   Pulse 98   Temp 98.2  ??F (36.8 ??C)   Resp 16   Ht 5\' 6"  (1.676 m)   Wt 56.7 kg (125 lb)   SpO2 100%   BMI 20.18 kg/m??    O2 Flow Rate (L/min): 2 l/min O2 Device: Room air    Temp (24hrs), Avg:97.7 ??F (36.5 ??C), Min:97.4 ??F (36.3 ??C), Max:98.2 ??F (36.8 ??C)    10/08 0701 - 10/08 1900  In: 200 [P.O.:200]  Out: -    10/06 1901 - 10/08 0700  In: 240 [P.O.:240]  Out: -       Data Review:       Recent Days:  Recent Labs     08/30/18  0658   WBC 8.3   HGB 10.8*   HCT 32.7*   PLT 145     Recent Labs     09/01/18  0724 08/31/18  1233 08/30/18  0658   NA  --   --  141   K  --   --  4.0   CL  --   --  100   CO2  --   --  33*   GLU  --   --  131*   BUN  --   --  28*   CREA  --   --  0.6   CA  --   --  8.8   MG 2.2 2.2 2.2   ALB  --   --  2.4*   TBILI  --   --  5.3*   SGOT  --   --  725*   ALT  --   --  239*     No results for input(s): PH, PCO2, PO2, HCO3, FIO2 in the last 72 hours.    24 Hour Results:  Recent Results (from the past 24 hour(s))   MAGNESIUM    Collection Time: 09/01/18  7:24 AM   Result Value Ref Range    Magnesium 2.2 1.6 - 2.6 mg/dl       Xr Chest Sngl V    Result Date: 08/24/2018  INDICATION: Wheezing, SOB   EXAMINATION: XR CHEST SNGL V COMPARISON: 08/17/2018 FINDINGS: Mild cardiomegaly.. Small bilateral pleural effusions. Multiple focal lung lesions bilaterally. Diffuse osteolytic bony changes..        IMPRESSION: 1. Mild cardiomegaly. 2. Small bilateral pleural effusions. 3. Multiple pulmonary nodules consistent with metastases. 4. Diffuse osteolytic lesions consistent with bony metastases.     Xr Chest Sngl V    Result Date: 08/17/2018  Indication: Short of breath Frontal view chest Heart mildly enlarged. Shallow inspiration with interstitial and patchy  alveolar densities throughout the lungs. Anterior cervical fusion at 3 adjacent lower cervical levels. Degenerative narrowing and sclerosis bilateral humeral joint. This rotated to the right.     IMPRESSION: Edema versus multifocal pneumonia or chronic changes bilaterally.  Cardiomegaly. Postsurgical changes cervical spine.     Ct Head Wo Cont    Result Date: 08/17/2018  DICOM format image data is available to nonaffiliated external healthcare facilities or entities on a secure, media free, reciprocally searchable basis with patient authorization for 12 months following the date of the study. Indication: Altered mental status Technique:  5 millimeter axial images without contrast were obtained through the brain. Comparison: none Findings:   There is generalized moderate cerebral atrophy.  Low attenuation surrounds the lateral ventricles. There is a rounded 16 x 14 mm mass in the right thalamus surrounding low-density edema. The mass is hyperdense peripherally and low density centrally. There is also focal low-density in the right parietal white matter suggesting vasogenic edema. Focal low-density seen more laterally in the right parietal cortex measuring 2.2 cm diameter. Apparent 2.6 cm x 2.2 cm mass is seen in the right cerebellum with surrounding low-density suggesting vasogenic edema. Asymmetric 11 mm low-density irregular lytic lesion is seen in the diploic space of the right parietal bone. No hemorrhage seen. Sella, pineal, craniocervical junction, orbits, paranasal sinuses, and temporal bones are unremarkable.      Impression: cerebral atrophy and nonspecific white matter changes not unusual for age; multiple lesions as described in brain and calvarium worrisome for metastatic disease on this noncontrast study. Contrast MRI suggested for further evaluation.     Cta Chest W Or W Wo Cont    Result Date: 08/17/2018  DICOM format image data is available to nonaffiliated external healthcare facilities or entities on a secure, media free, reciprocally searchable basis with patient authorization for 12 months following the date of the study. Indication: Altered mental status Technique: 3 millimeter axial images with IV contrast obtained from lung apices to bases. 3-D MIP CTA images  obtained. Comparison: Frontal view chest 08/17/2018 Findings:  Mediastinum: No aortic dissection or pulmonary embolus. Calcified aortic arch and mild coronary calcifications. 10 mm low-density focus right thyroid lobe. Mild cardiomegaly. Small bilateral pleural effusions. Lungs: Numerous nodules of varying sizes throughout the lungs largest of which left lower lobe measures 18 x 19 mm. Focal pleural thickening adjacent to rib destruction right lower lobe posterolaterally. Bones/ chest wall: Extensive lytic lesions throughout the skeletal system of the thorax including sternum, spine, and ribs. High-grade compression fractures T3 and T4 with mild retropulsion. Anterior cervical fusion. Multiple masses are seen throughout the right breast the largest of which is partially imaged laterally at 6.8 x 3.5 cm; there is overlying right breast skin thickening. Asymmetric upper normal size nodes are seen in the right axilla. Below the diaphragm liver is heterogeneously low in density.     Impression: 1. No evidence of pulmonary embolus or aortic dissection. 2. 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 3. Indeterminate lesion right thyroid lobe which could be evaluated with ultrasound. 4. Cardiomegaly. 5. Bilateral small pleural effusions. 6. Multiple lesions throughout lungs and bones consistent with metastatic disease possibly from breast primary with asymmetric multi centric lesions throughout the right breast and overlying skin thickening with asymmetric upper normal size right axillary nodes. 7. Compression fractures T3 and T4 with retropulsion. 8. Heterogeneous liver. Metastatic disease not excluded.     Ct Abd Pelv W Cont    Result Date: 08/17/2018  DICOM  format image data is available to nonaffiliated external healthcare facilities or entities on a secure, media free, reciprocally searchable basis with patient authorization for 12 months following the date of the study. TECHNIQUE: CT of the abdomen  and pelvis WITH intravenous contrast.  The abdomen and pelvis were scanned utilizing a multidetector helical scanner from the diaphragm to the lesser trochanter without the oral administration of barium.  Coronal and sagittal reformations were obtained. COMPARISON: none INDICATION: Altered mental status DISCUSSION: HEPATOBILIARY: Multiple low-density ill-defined lesions throughout the liver with mild hepatomegaly. Gallbladder mildly thick-walled but otherwise unremarkable. SPLEEN: No splenomegaly or focal lesion. PANCREAS: Atrophic. ADRENALS: No adrenal nodules. KIDNEYS/URETERS: 17 mm rounded low-density right lower renal pole cystic cyst without hydronephrosis. PELVIC ORGANS/BLADDER: Uterus atrophic or removed. Extensive artifact in pelvis due to right hip replacement. PERITONEUM / RETROPERITONEUM: No free air or fluid. LYMPH NODES: No lymphadenopathy. VESSELS: Heavy calcification in nondilated aorta and iliac vessels. GI TRACT: No distention or wall thickening. No evidence of appendicitis. Large amount retained fecal material in colon and rectum. BONES AND SOFT TISSUES: Extensive lytic destruction of the bony structures throughout spine and pelvis with left hip replacement. Multiple compression fractures of vertebral bodies moderate grade at L3 and low-grade at L2 and L4 without significant retropulsion. Small fracture right superior acetabular margin laterally.     IMPRESSION: 1. 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 Compression fractures L2 through L4. Small acute fracture suspected at superior lateral right acetabular margin. 2. Indeterminate right renal lesion likely represent cyst which could be confirmed with ultrasound. 3. Left hip replacement. 4. Uterus atrophic or removed. 5. Atherosclerotic vascular disease. 6. Constipation.       Microbiology:  All Micro Results     None           Cardiology:  Results for orders placed or performed during the hospital encounter of 08/17/18   EKG, 12  LEAD, INITIAL   Result Value Ref Range    Ventricular Rate 120 BPM    Atrial Rate 120 BPM    P-R Interval 150 ms    QRS Duration 84 ms    Q-T Interval 384 ms    QTC Calculation (Bezet) 542 ms    Calculated P Axis 73 degrees    Calculated R Axis 0 degrees    Calculated T Axis -97 degrees    Diagnosis        Poor data quality, interpretation may be adversely affected  Sinus tachycardia  Septal infarct , age undetermined  T wave abnormality, consider inferior ischemia  T wave abnormality, consider anterolateral ischemia  Prolonged QT  When compared with ECG of 17-Aug-2018 20:03,  Septal infarct is now present  T wave inversion now evident in Inferior leads  T wave inversion now evident in Anterolateral leads  Confirmed by Marrion Coy, M.D., Ellin Saba. ([id number]) on 08/21/2018 11:43:51 AM           Problem List:  Problem List as of 09/01/2018 Never Reviewed          Codes Class Noted - Resolved    Lactic acidosis ICD-10-CM: E87.2  ICD-9-CM: 276.2  08/17/2018 - Present        Altered mental status ICD-10-CM: R41.82  ICD-9-CM: 780.97  08/17/2018 - Present        Metastasis from breast cancer [facility]) ICD-10-CM: C79.9, C50.919  ICD-9-CM: 199.1, 174.9  08/17/2018 - Present              Medications reviewed  Current Facility-Administered  Medications   Medication Dose Route Frequency   ??? levalbuterol (XOPENEX) nebulizer soln 1.25 mg/3 mL  1.25 mg Nebulization Q6H PRN   ??? guaiFENesin (ROBITUSSIN) 100 mg/5 mL oral liquid 200 mg  200 mg Oral Q4H PRN   ??? furosemide (LASIX) tablet 20 mg  20 mg Oral DAILY   ??? potassium chloride SR (KLOR-CON 10) tablet 10 mEq  10 mEq Oral DAILY   ??? ibuprofen (MOTRIN) tablet 400 mg  400 mg Oral Q6H PRN   ??? nystatin (MYCOSTATIN) 100,000 unit/mL oral suspension 500,000 Units  500,000 Units Oral QID   ??? levalbuterol (XOPENEX) nebulizer soln 1.25 mg/3 mL  1.25 mg Nebulization Q4H PRN   ??? melatonin tablet 3 mg  3 mg Oral QHS   ??? haloperidol (HALDOL) tablet 1 mg  1 mg Oral QHS PRN   ??? sodium chloride (NS) flush 5-10 mL  5-10  mL IntraVENous Q8H   ??? sodium chloride (NS) flush 5-10 mL  5-10 mL IntraVENous PRN   ??? naloxone (NARCAN) injection 0.1 mg  0.1 mg IntraVENous PRN   ??? acetaminophen (TYLENOL) tablet 650 mg  650 mg Oral Q6H PRN   ??? enoxaparin (LOVENOX) injection 40 mg  40 mg SubCUTAneous Q24H   ??? influenza vaccine 2019-20 (4 yrs+)(PF) (FLUCELVAX QUAD) injection 0.5 mL  0.5 mL IntraMUSCular PRIOR TO DISCHARGE   ??? dexamethasone (DECADRON) 4 mg/mL injection 4 mg  4 mg IntraVENous Q6H         Kristen Loader, MD

## 2018-09-02 LAB — RENAL FUNCTION PANEL
Albumin: 2.4 gm/dl — ABNORMAL LOW (ref 3.4–5.0)
Albumin: 2.4 gm/dl — ABNORMAL LOW (ref 3.4–5.0)
Anion Gap: 11 mmol/L (ref 5–15)
Anion gap: 11 mmol/L (ref 5–15)
BUN: 41 mg/dl — ABNORMAL HIGH (ref 7–25)
BUN: 41 mg/dl — ABNORMAL HIGH (ref 7–25)
CO2: 28 mEq/L (ref 21–32)
CO2: 28 mEq/L (ref 21–32)
Calcium: 8.3 mg/dl — ABNORMAL LOW (ref 8.5–10.1)
Calcium: 8.3 mg/dl — ABNORMAL LOW (ref 8.5–10.1)
Chloride: 102 mEq/L (ref 98–107)
Chloride: 102 mEq/L (ref 98–107)
Creatinine: 0.8 mg/dl (ref 0.6–1.3)
Creatinine: 0.8 mg/dl (ref 0.6–1.3)
EGFR IF NonAfrican American: >60
GFR African American: >60
GFR est AA: >60
GFR est non-AA: >60
Glucose: 166 mg/dl — ABNORMAL HIGH (ref 74–106)
Glucose: 166 mg/dl — ABNORMAL HIGH (ref 74–106)
Phosphorus: 3.5 mg/dl (ref 2.5–4.9)
Phosphorus: 3.5 mg/dl (ref 2.5–4.9)
Potassium: 4.2 mEq/L (ref 3.5–5.1)
Potassium: 4.2 mEq/L (ref 3.5–5.1)
Sodium: 140 mEq/L (ref 136–145)
Sodium: 140 mEq/L (ref 136–145)

## 2018-09-02 LAB — MAGNESIUM
Magnesium: 2.5 mg/dl (ref 1.6–2.6)
Magnesium: 2.5 mg/dl (ref 1.6–2.6)

## 2018-09-02 MED ORDER — DEXAMETHASONE 4 MG TAB
4 mg | Freq: Four times a day (QID) | ORAL | Status: DC
Start: 2018-09-02 — End: 2018-09-03
  Administered 2018-09-02 – 2018-09-03 (×4): via ORAL

## 2018-09-02 MED FILL — MELATONIN 3 MG TAB: 3 mg | ORAL | Qty: 1

## 2018-09-02 MED FILL — DEXAMETHASONE SODIUM PHOSPHATE 4 MG/ML IJ SOLN: 4 mg/mL | INTRAMUSCULAR | Qty: 1

## 2018-09-02 MED FILL — NYSTATIN 100,000 UNIT/ML ORAL SUSP: 100000 unit/mL | ORAL | Qty: 5

## 2018-09-02 MED FILL — LOVENOX 40 MG/0.4 ML SUBCUTANEOUS SYRINGE: 40 mg/0.4 mL | SUBCUTANEOUS | Qty: 0.4

## 2018-09-02 MED FILL — BD POSIFLUSH NORMAL SALINE 0.9 % INJECTION SYRINGE: INTRAMUSCULAR | Qty: 10

## 2018-09-02 MED FILL — FUROSEMIDE 20 MG TAB: 20 mg | ORAL | Qty: 1

## 2018-09-02 MED FILL — DEXAMETHASONE 4 MG TAB: 4 mg | ORAL | Qty: 1

## 2018-09-02 MED FILL — ACETAMINOPHEN 325 MG TABLET: 325 mg | ORAL | Qty: 2

## 2018-09-02 MED FILL — POTASSIUM CHLORIDE SR 10 MEQ TAB: 10 mEq | ORAL | Qty: 1

## 2018-09-02 NOTE — Progress Notes (Addendum)
Hospitalist Progress Note  Kristen Loader, MD  [facility]    Daily Progress Note: 09/02/2018    Assessment/Plan:     Vasogenic cerebral edema??from??brain mass??  Suspected to be metastatic breast cancer  Pt has metabolic encephalopathy from this  Pt is on Decadron 4mg  IV q6h, but will change to PO 10/9  Pt completed a 10 fraction course (3000 cGy) of XRT Mon-Friday (ended 10/8)  Dr Joylene Draft was following and noted that he offered additional XRT to different areas (spine and other areas), but the pt declined further XRT.  She looks better than on admission per the family  Prognosis is poor though  ??  Dehydration  Rehydrated.??  Recheck Renal fxn.  If dry will d/c Lasix as her PO is mediocre    Dyspnea  Probably mostly metastatic dz as CxR did not show CHF/edema and her lungs are clear on exam.  Pt did respond to Lasix though so there could be some CHF contribution  Added Lasix 20mg  daily (10/1)  ??  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   Family is taking things day by day and want to see what options are available  Per Dr Olene Floss note on 9/27, it looks like it will be just hormonal therapy and XRT.  Onc noted prognosis is poor.  Dr Rexford Maus gave Faslodex on 9/26 and the plan is to continue injections q2 weeks??  I spoke with Dr. Aldona Bar of oncology (10/1) who noted that the patient's performance status is poor and until she can get better, and the patient can go to outpatient follow-up with Dr. Brent General, he noted that the patient would not be eligible for any sort of chemotherapy or immunotherapy at this time.    He noted that the patient is only able to tolerate hormonal therapy but also only if she can make it to the outpt appointments.  ??  Hypercalcemia  S/p Bisphophonate.  Ca coming down and is with some hypocalcemia  ??  SIRS  Sepsis ruled out  ??   Severe protein calorie malnutrition??  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  Pt was evaluated by Inpt wound care RN who noted a Right axillary indurated, discolored site measuring 3 x 3 cm.  There is a full-thickness opening. She recommended daily cleansing of the area. If drainage noted, a Mepilex could be applied.  If the site becomes odorous they would recommend daily Dakin's 1/4 strength dampened gauze    Generalized weakness  PT/OT re-ordered  ??  Insomnia  Melatonin for sleep  ??  Acute hypoxemic respiratory failure  Transiently noted on 9/30  Thought to be due to pulmonary edema as the pt responded to IV Lasix  Pt required hi flow O2 transiently  Resolved    Metabolic encephalopathy.  Due to hypercalcemia and brain metz.  Improved mentation.  Pt has not had any behavioral issues recently.  D/C Haldol PRN  Pt is calm and follows simple commands.  Pt has a baseline dementia    Code status. DNR    Disposition  Family aware pt is doing poorly, but would like to see if pt can bounce back and get strong enough to get Chemo or Immunotherapy  Pt's XRT ended  10/8  Placement to SNF if pt able to get ins auth and acceptance at a facility  CM noted we are awaiting authorization      ------------------    36 minutes were spent on the patient of which more than 50% was spent in coordination of care and counseling (time spent with patient/family face to face, physical exam, reviewing laboratory and imaging investigations, speaking with physicians and nursing staff involved in this patient's care)    ------------------  Physical exam:  General: Awake, NAD.  Cooperative.   HEENT: Sclera anicteric, PERRL, OM moist, throat clear.  Chest:  CTA B, no wheeze, no rales, no rhonchi.  Good air movement.  CV: RRR, S1/S2 were normal, no murmur  Abdomen: NTND, soft, NABS, no masses were noted.  No rebound, no guarding.  Extremities: No edema.     Subjective:   No family at bedside. Said she was "ok", no CP, no SOB, no abd pain. She said she isn't really hungry.    Objective:     Visit Vitals  BP 135/60 (BP 1 Location: Left arm, BP Patient Position: Supine)   Pulse 91   Temp 97.4 ??F (36.3 ??C)   Resp 16   Ht 5\' 6"  (1.676 m)   Wt 56.7 kg (125 lb)   SpO2 100%   BMI 20.18 kg/m??    O2 Flow Rate (L/min): 2 l/min O2 Device: Room air    Temp (24hrs), Avg:97.7 ??F (36.5 ??C), Min:97.3 ??F (36.3 ??C), Max:98.4 ??F (36.9 ??C)    No intake/output data recorded.   10/07 1901 - 10/09 0700  In: 62 [P.O.:640]  Out: -       Data Review:       Recent Days:  No results for input(s): WBC, HGB, HCT, PLT, HGBEXT, HCTEXT, PLTEXT, HGBEXT, HCTEXT, PLTEXT in the last 72 hours.  Recent Labs     09/02/18  0619 09/01/18  0724 08/31/18  1233   MG 2.5 2.2 2.2     No results for input(s): PH, PCO2, PO2, HCO3, FIO2 in the last 72 hours.    24 Hour Results:  Recent Results (from the past 24 hour(s))   MAGNESIUM    Collection Time: 09/02/18  6:19 AM   Result Value Ref Range    Magnesium 2.5 1.6 - 2.6 mg/dl       Xr Chest Sngl V    Result Date: 08/24/2018  INDICATION: Wheezing, SOB   EXAMINATION: XR CHEST SNGL V COMPARISON: 08/17/2018 FINDINGS: Mild cardiomegaly.. Small bilateral pleural effusions. Multiple focal lung lesions bilaterally. Diffuse osteolytic bony changes..        IMPRESSION: 1. Mild cardiomegaly. 2. Small bilateral pleural effusions. 3. Multiple pulmonary nodules consistent with metastases. 4. Diffuse osteolytic lesions consistent with bony metastases.     Xr Chest Sngl V    Result Date: 08/17/2018  Indication: Short of breath Frontal view chest Heart mildly enlarged. Shallow inspiration with interstitial and patchy alveolar densities throughout the lungs. Anterior cervical fusion at 3 adjacent lower cervical levels. Degenerative narrowing and sclerosis bilateral humeral joint. This rotated to the right.     IMPRESSION: Edema versus multifocal pneumonia or chronic changes  bilaterally. Cardiomegaly. Postsurgical changes cervical spine.     Ct Head Wo Cont    Result Date: 08/17/2018  DICOM format image data is available to nonaffiliated external healthcare facilities or entities on a secure, media free, reciprocally searchable basis with patient authorization for 12 months following the date of the study. Indication: Altered  mental status Technique:  5 millimeter axial images without contrast were obtained through the brain. Comparison: none Findings:   There is generalized moderate cerebral atrophy.  Low attenuation surrounds the lateral ventricles. There is a rounded 16 x 14 mm mass in the right thalamus surrounding low-density edema. The mass is hyperdense peripherally and low density centrally. There is also focal low-density in the right parietal white matter suggesting vasogenic edema. Focal low-density seen more laterally in the right parietal cortex measuring 2.2 cm diameter. Apparent 2.6 cm x 2.2 cm mass is seen in the right cerebellum with surrounding low-density suggesting vasogenic edema. Asymmetric 11 mm low-density irregular lytic lesion is seen in the diploic space of the right parietal bone. No hemorrhage seen. Sella, pineal, craniocervical junction, orbits, paranasal sinuses, and temporal bones are unremarkable.      Impression: cerebral atrophy and nonspecific white matter changes not unusual for age; multiple lesions as described in brain and calvarium worrisome for metastatic disease on this noncontrast study. Contrast MRI suggested for further evaluation.     Cta Chest W Or W Wo Cont    Result Date: 08/17/2018  DICOM format image data is available to nonaffiliated external healthcare facilities or entities on a secure, media free, reciprocally searchable basis with patient authorization for 12 months following the date of the study. Indication: Altered mental status Technique: 3 millimeter axial images with IV contrast obtained from lung apices to bases. 3-D MIP CTA  images obtained. Comparison: Frontal view chest 08/17/2018 Findings:  Mediastinum: No aortic dissection or pulmonary embolus. Calcified aortic arch and mild coronary calcifications. 10 mm low-density focus right thyroid lobe. Mild cardiomegaly. Small bilateral pleural effusions. Lungs: Numerous nodules of varying sizes throughout the lungs largest of which left lower lobe measures 18 x 19 mm. Focal pleural thickening adjacent to rib destruction right lower lobe posterolaterally. Bones/ chest wall: Extensive lytic lesions throughout the skeletal system of the thorax including sternum, spine, and ribs. High-grade compression fractures T3 and T4 with mild retropulsion. Anterior cervical fusion. Multiple masses are seen throughout the right breast the largest of which is partially imaged laterally at 6.8 x 3.5 cm; there is overlying right breast skin thickening. Asymmetric upper normal size nodes are seen in the right axilla. Below the diaphragm liver is heterogeneously low in density.     Impression: 1. No evidence of pulmonary embolus or aortic dissection. 2. 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 3. Indeterminate lesion right thyroid lobe which could be evaluated with ultrasound. 4. Cardiomegaly. 5. Bilateral small pleural effusions. 6. Multiple lesions throughout lungs and bones consistent with metastatic disease possibly from breast primary with asymmetric multi centric lesions throughout the right breast and overlying skin thickening with asymmetric upper normal size right axillary nodes. 7. Compression fractures T3 and T4 with retropulsion. 8. Heterogeneous liver. Metastatic disease not excluded.     Ct Abd Pelv W Cont    Result Date: 08/17/2018  DICOM format image data is available to nonaffiliated external healthcare facilities or entities on a secure, media free, reciprocally searchable basis with patient authorization for 12 months following the date of the  study. TECHNIQUE: CT of the abdomen and pelvis WITH intravenous contrast.  The abdomen and pelvis were scanned utilizing a multidetector helical scanner from the diaphragm to the lesser trochanter without the oral administration of barium.  Coronal and sagittal reformations were obtained. COMPARISON: none INDICATION: Altered mental status DISCUSSION: HEPATOBILIARY: Multiple low-density ill-defined lesions throughout the liver with mild hepatomegaly. Gallbladder mildly  thick-walled but otherwise unremarkable. SPLEEN: No splenomegaly or focal lesion. PANCREAS: Atrophic. ADRENALS: No adrenal nodules. KIDNEYS/URETERS: 17 mm rounded low-density right lower renal pole cystic cyst without hydronephrosis. PELVIC ORGANS/BLADDER: Uterus atrophic or removed. Extensive artifact in pelvis due to right hip replacement. PERITONEUM / RETROPERITONEUM: No free air or fluid. LYMPH NODES: No lymphadenopathy. VESSELS: Heavy calcification in nondilated aorta and iliac vessels. GI TRACT: No distention or wall thickening. No evidence of appendicitis. Large amount retained fecal material in colon and rectum. BONES AND SOFT TISSUES: Extensive lytic destruction of the bony structures throughout spine and pelvis with left hip replacement. Multiple compression fractures of vertebral bodies moderate grade at L3 and low-grade at L2 and L4 without significant retropulsion. Small fracture right superior acetabular margin laterally.     IMPRESSION: 1. 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 Compression fractures L2 through L4. Small acute fracture suspected at superior lateral right acetabular margin. 2. Indeterminate right renal lesion likely represent cyst which could be confirmed with ultrasound. 3. Left hip replacement. 4. Uterus atrophic or removed. 5. Atherosclerotic vascular disease. 6. Constipation.       Microbiology:  All Micro Results     None           Cardiology:   Results for orders placed or performed during the hospital encounter of 08/17/18   EKG, 12 LEAD, INITIAL   Result Value Ref Range    Ventricular Rate 120 BPM    Atrial Rate 120 BPM    P-R Interval 150 ms    QRS Duration 84 ms    Q-T Interval 384 ms    QTC Calculation (Bezet) 542 ms    Calculated P Axis 73 degrees    Calculated R Axis 0 degrees    Calculated T Axis -97 degrees    Diagnosis        Poor data quality, interpretation may be adversely affected  Sinus tachycardia  Septal infarct , age undetermined  T wave abnormality, consider inferior ischemia  T wave abnormality, consider anterolateral ischemia  Prolonged QT  When compared with ECG of 17-Aug-2018 20:03,  Septal infarct is now present  T wave inversion now evident in Inferior leads  T wave inversion now evident in Anterolateral leads  Confirmed by Marrion Coy, M.D., Ellin Saba. ([id number]) on 08/21/2018 11:43:51 AM           Problem List:  Problem List as of 09/02/2018 Never Reviewed          Codes Class Noted - Resolved    Lactic acidosis ICD-10-CM: E87.2  ICD-9-CM: 276.2  08/17/2018 - Present        Altered mental status ICD-10-CM: R41.82  ICD-9-CM: 780.97  08/17/2018 - Present        Metastasis from breast cancer [facility]) ICD-10-CM: C79.9, C50.919  ICD-9-CM: 199.1, 174.9  08/17/2018 - Present              Medications reviewed  Current Facility-Administered Medications   Medication Dose Route Frequency   ??? levalbuterol (XOPENEX) nebulizer soln 1.25 mg/3 mL  1.25 mg Nebulization Q6H PRN   ??? guaiFENesin (ROBITUSSIN) 100 mg/5 mL oral liquid 200 mg  200 mg Oral Q4H PRN   ??? furosemide (LASIX) tablet 20 mg  20 mg Oral DAILY   ??? potassium chloride SR (KLOR-CON 10) tablet 10 mEq  10 mEq Oral DAILY   ??? ibuprofen (MOTRIN) tablet 400 mg  400 mg Oral Q6H PRN   ??? nystatin (MYCOSTATIN) 100,000 unit/mL oral suspension 500,000 Units  500,000 Units Oral QID   ??? levalbuterol (XOPENEX) nebulizer soln 1.25 mg/3 mL  1.25 mg Nebulization Q4H PRN   ??? melatonin tablet 3 mg  3 mg Oral QHS    ??? haloperidol (HALDOL) tablet 1 mg  1 mg Oral QHS PRN   ??? sodium chloride (NS) flush 5-10 mL  5-10 mL IntraVENous Q8H   ??? sodium chloride (NS) flush 5-10 mL  5-10 mL IntraVENous PRN   ??? naloxone (NARCAN) injection 0.1 mg  0.1 mg IntraVENous PRN   ??? acetaminophen (TYLENOL) tablet 650 mg  650 mg Oral Q6H PRN   ??? enoxaparin (LOVENOX) injection 40 mg  40 mg SubCUTAneous Q24H   ??? influenza vaccine 2019-20 (4 yrs+)(PF) (FLUCELVAX QUAD) injection 0.5 mL  0.5 mL IntraMUSCular PRIOR TO DISCHARGE   ??? dexamethasone (DECADRON) 4 mg/mL injection 4 mg  4 mg IntraVENous Q6H         Kristen Loader, MD

## 2018-09-02 NOTE — Other (Signed)
Bedside shift change report given to Amy, RN (oncoming nurse) by Nena, RN (offgoing nurse). Report included the following information SBAR.

## 2018-09-02 NOTE — Progress Notes (Signed)
Palliative Care RN Progress Note  Palliative Care Team is available Monday-Friday 8 AM-4:30 PM       NAME:  Kayla [name]   DOB:   [birth year]   MRN:   [id number]     Date/Time Patient Seen:  09/02/2018 10:14 AM    Attending Physician: Kristen Loader, MD  Primary Care Physician: Henrene Pastor, MD        Palliative Assessment/Plan:     Code Status:  DNR (DDNR signed by MD and placed on chart, children out of town unable to sign)  Goals of Care: Continue current treatment, children understand patients overall prognosis is poor and her only option at this time is to work on increasing her strength/conditiong to potentially be able to tolerate chemotherapy in the next few weeks.  In the meantime they want to focus on PT/OT and increasing her appetite/nutrition.  They also confirmed wanting to change her code status to DNR.      OT/PT working with patient and documenting "Patient???s rehabilitation potential is considered to be Fair d/t medical complexities" and recommend SNF w/ 24/7 supervision    PC will now sign off, GOC are established      Disposition: rehab upon discharge, family not ready for hospice    Current Capacity for Decision Making:  Awake, but lethargic and confused, not decisional      Advance Care Planning:      Advance Directives:          Medical Power of Attorney: none          Living Will: none          POLST forms:  There is no POLST (Physician orders for life-sustaining treatment) form on file for this patient     No Advance Directive: Surrogate Decision Maker: Kayla [name] (daughter) [phone]; Kayla [name] (daughter) [phone]; Kayla [name] (daughter) [phone]; Kayla [name] (son) [phone]          The following were updated:  Discussed with Attending Physician/Provider: Kristen Loader, MD   Other Physician (consultant): reviewed chart notes   Nursing: RN  Care Manager/Discharge Planner: n/a today      Aline August RN, BSN, [facility]  Palliative Medicine  [phone] office       The Palliative Medicine team is available Monday to Friday from 8am to 4:30pm at [phone]    Interval History:       HPI:      HPI: Pt is 77 year old female with breast CA followed Dr. Brent General in [place] undergoing systemic hormal therapy with faslodex. Admitted on 08/17/18 for AMS; head/abd/pelv/chest CT in ED worrisome for brain with vasogenic edemia, lung, liver mets; pt undergoing palliative WBR but unable to tolerate lying flat and then later declined to complete simulation mapping to lumbar spine fields per Dr. Callie Fielding; planning for x1 dose of faslodex but overall poor prognosis per [facility] Dr. Rexford Maus. Pertinent comorbidities include HTN.  PTA medications per EMR: Lisinopril, fentanyl TD 31mcg/hr, oxycodone IR 10mg  PO Q4hr PRN.  Social History: 4 children  Religious/Cultural/Spiritual: CHRISTIAN  Insurance:  Payor: Psychologist, prison and probation services / Plan: [place] / Product Type: Managed Care Medicare /    ECOG Performance Status (current): 4  Prior ECOG: 4  Palliative Performance Scale (PPS): 10-20%  Prior PPS: 20-30%  FAST Scale in Dementia: n/a  PC Consulted AT:[id number] by Dr. Nechama Guard  on 9/27  for goals of care, metastatic breast cancer     Review of Systems:   Unable  to obtain due to clinical circumstance      Physical Exam:   Blood pressure 135/71, pulse 93, temperature 98.4 ??F (36.9 ??C), resp. rate 16, height 5\' 6"  (1.676 m), weight 56.7 kg (125 lb), SpO2 100 %.  Body mass index is 20.18 kg/m??.  Wt Readings from Last 3 Encounters:   08/24/18 56.7 kg (125 lb)     Last Bowel Movement: Last Bowel Movement Date: 09/01/18

## 2018-09-02 NOTE — Progress Notes (Signed)
Hospitalist Progress Note  Kristen Loader, MD  [facility]    Daily Progress Note: 09/02/2018    Assessment/Plan:     Vasogenic cerebral edema??from??brain mass??  Suspected to be metastatic breast cancer  Pt has metabolic encephalopathy from this  Pt is on Decadron 4mg  IV q6h, but will change to PO 10/9  Pt completed a 10 fraction course (3000 cGy) of XRT Mon-Friday (ended 10/8)  Dr Joylene Draft was following and noted that he offered additional XRT to different areas (spine and other areas), but the pt declined further XRT.  She looks better than on admission per the family  Prognosis is poor though  ??  Dehydration  Rehydrated.??  Recheck Renal fxn.  If dry will d/c Lasix as her PO is mediocre    Dyspnea  Probably mostly metastatic dz as CxR did not show CHF/edema and her lungs are clear on exam.  Pt did respond to Lasix though so there could be some CHF contribution  Added Lasix 20mg  daily (10/1)  ??  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   Family is taking things day by day and want to see what options are available  Per Dr Olene Floss note on 9/27, it looks like it will be just hormonal therapy and XRT.  Onc noted prognosis is poor.  Dr Rexford Maus gave Faslodex on 9/26 and the plan is to continue injections q2 weeks??  I spoke with Dr. Aldona Bar of oncology (10/1) who noted that the patient's performance status is poor and until she can get better, and the patient can go to outpatient follow-up with Dr. Brent General, he noted that the patient would not be eligible for any sort of chemotherapy or immunotherapy at this time.    He noted that the patient is only able to tolerate hormonal therapy but also only if she can make it to the outpt appointments.  ??  Hypercalcemia  S/p Bisphophonate.  Ca coming down and is with some hypocalcemia  ??  SIRS  Sepsis ruled  out  ??  Severe protein calorie malnutrition??  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  Pt was evaluated by Inpt wound care RN who noted a Right axillary indurated, discolored site measuring 3 x 3 cm.  There is a full-thickness opening. She recommended daily cleansing of the area. If drainage noted, a Mepilex could be applied.  If the site becomes odorous they would recommend daily Dakin's 1/4 strength dampened gauze    Generalized weakness  PT/OT re-ordered  ??  Insomnia  Melatonin for sleep  ??  Acute hypoxemic respiratory failure  Transiently noted on 9/30  Thought to be due to pulmonary edema as the pt responded to IV Lasix  Pt required hi flow O2 transiently  Resolved    Metabolic encephalopathy.  Due to hypercalcemia and brain metz.  Improved mentation.  Pt has not had any behavioral issues recently.  D/C Haldol PRN  Pt is calm and follows simple commands.  Pt has a baseline dementia    Code status. DNR    Disposition  Family aware pt is doing poorly, but would like to see if pt can bounce back and get strong enough to get Chemo or Immunotherapy  Pt's XRT ended  10/8  Placement to SNF if pt able to get ins auth and acceptance at a facility  CM noted we are awaiting authorization      ------------------    36 minutes were spent on the patient of which more than 50% was spent in coordination of care and counseling (time spent with patient/family face to face, physical exam, reviewing laboratory and imaging investigations, speaking with physicians and nursing staff involved in this patient's care)    ------------------  Physical exam:  General: Awake, NAD.  Cooperative.   HEENT: Sclera anicteric, PERRL, OM moist, throat clear.  Chest:  CTA B, no wheeze, no rales, no rhonchi.  Good air movement.  CV: RRR, S1/S2 were normal, no murmur  Abdomen: NTND, soft, NABS, no masses were noted.  No rebound, no guarding.  Extremities: No  edema.    Subjective:   No family at bedside. Said she was "ok", no CP, no SOB, no abd pain. She said she isn't really hungry.    Objective:     Visit Vitals  BP 135/60 (BP 1 Location: Left arm, BP Patient Position: Supine)   Pulse 91   Temp 97.4 ??F (36.3 ??C)   Resp 16   Ht 5\' 6"  (1.676 m)   Wt 56.7 kg (125 lb)   SpO2 100%   BMI 20.18 kg/m??    O2 Flow Rate (L/min): 2 l/min O2 Device: Room air    Temp (24hrs), Avg:97.7 ??F (36.5 ??C), Min:97.3 ??F (36.3 ??C), Max:98.4 ??F (36.9 ??C)    No intake/output data recorded.   10/07 1901 - 10/09 0700  In: 25 [P.O.:640]  Out: -       Data Review:       Recent Days:  No results for input(s): WBC, HGB, HCT, PLT, HGBEXT, HCTEXT, PLTEXT, HGBEXT, HCTEXT, PLTEXT in the last 72 hours.  Recent Labs     09/02/18  0619 09/01/18  0724 08/31/18  1233   MG 2.5 2.2 2.2     No results for input(s): PH, PCO2, PO2, HCO3, FIO2 in the last 72 hours.    24 Hour Results:  Recent Results (from the past 24 hour(s))   MAGNESIUM    Collection Time: 09/02/18  6:19 AM   Result Value Ref Range    Magnesium 2.5 1.6 - 2.6 mg/dl       Xr Chest Sngl V    Result Date: 08/24/2018  INDICATION: Wheezing, SOB   EXAMINATION: XR CHEST SNGL V COMPARISON: 08/17/2018 FINDINGS: Mild cardiomegaly.. Small bilateral pleural effusions. Multiple focal lung lesions bilaterally. Diffuse osteolytic bony changes..        IMPRESSION: 1. Mild cardiomegaly. 2. Small bilateral pleural effusions. 3. Multiple pulmonary nodules consistent with metastases. 4. Diffuse osteolytic lesions consistent with bony metastases.     Xr Chest Sngl V    Result Date: 08/17/2018  Indication: Short of breath Frontal view chest Heart mildly enlarged. Shallow inspiration with interstitial and patchy alveolar densities throughout the lungs. Anterior cervical fusion at 3 adjacent lower cervical levels. Degenerative narrowing and sclerosis bilateral humeral joint. This rotated to the right.     IMPRESSION: Edema versus multifocal pneumonia or chronic changes  bilaterally. Cardiomegaly. Postsurgical changes cervical spine.     Ct Head Wo Cont    Result Date: 08/17/2018  DICOM format image data is available to nonaffiliated external healthcare facilities or entities on a secure, media free, reciprocally searchable basis with patient authorization for 12 months following the date of the study. Indication: Altered  mental status Technique:  5 millimeter axial images without contrast were obtained through the brain. Comparison: none Findings:   There is generalized moderate cerebral atrophy.  Low attenuation surrounds the lateral ventricles. There is a rounded 16 x 14 mm mass in the right thalamus surrounding low-density edema. The mass is hyperdense peripherally and low density centrally. There is also focal low-density in the right parietal white matter suggesting vasogenic edema. Focal low-density seen more laterally in the right parietal cortex measuring 2.2 cm diameter. Apparent 2.6 cm x 2.2 cm mass is seen in the right cerebellum with surrounding low-density suggesting vasogenic edema. Asymmetric 11 mm low-density irregular lytic lesion is seen in the diploic space of the right parietal bone. No hemorrhage seen. Sella, pineal, craniocervical junction, orbits, paranasal sinuses, and temporal bones are unremarkable.      Impression: cerebral atrophy and nonspecific white matter changes not unusual for age; multiple lesions as described in brain and calvarium worrisome for metastatic disease on this noncontrast study. Contrast MRI suggested for further evaluation.     Cta Chest W Or W Wo Cont    Result Date: 08/17/2018  DICOM format image data is available to nonaffiliated external healthcare facilities or entities on a secure, media free, reciprocally searchable basis with patient authorization for 12 months following the date of the study. Indication: Altered mental status Technique: 3 millimeter axial images with IV contrast obtained from lung apices to bases. 3-D MIP CTA  images obtained. Comparison: Frontal view chest 08/17/2018 Findings:  Mediastinum: No aortic dissection or pulmonary embolus. Calcified aortic arch and mild coronary calcifications. 10 mm low-density focus right thyroid lobe. Mild cardiomegaly. Small bilateral pleural effusions. Lungs: Numerous nodules of varying sizes throughout the lungs largest of which left lower lobe measures 18 x 19 mm. Focal pleural thickening adjacent to rib destruction right lower lobe posterolaterally. Bones/ chest wall: Extensive lytic lesions throughout the skeletal system of the thorax including sternum, spine, and ribs. High-grade compression fractures T3 and T4 with mild retropulsion. Anterior cervical fusion. Multiple masses are seen throughout the right breast the largest of which is partially imaged laterally at 6.8 x 3.5 cm; there is overlying right breast skin thickening. Asymmetric upper normal size nodes are seen in the right axilla. Below the diaphragm liver is heterogeneously low in density.     Impression: 1. No evidence of pulmonary embolus or aortic dissection. 2. 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 3. Indeterminate lesion right thyroid lobe which could be evaluated with ultrasound. 4. Cardiomegaly. 5. Bilateral small pleural effusions. 6. Multiple lesions throughout lungs and bones consistent with metastatic disease possibly from breast primary with asymmetric multi centric lesions throughout the right breast and overlying skin thickening with asymmetric upper normal size right axillary nodes. 7. Compression fractures T3 and T4 with retropulsion. 8. Heterogeneous liver. Metastatic disease not excluded.     Ct Abd Pelv W Cont    Result Date: 08/17/2018  DICOM format image data is available to nonaffiliated external healthcare facilities or entities on a secure, media free, reciprocally searchable basis with patient authorization for 12 months following the date of the study. TECHNIQUE: CT of the  abdomen and pelvis WITH intravenous contrast.  The abdomen and pelvis were scanned utilizing a multidetector helical scanner from the diaphragm to the lesser trochanter without the oral administration of barium.  Coronal and sagittal reformations were obtained. COMPARISON: none INDICATION: Altered mental status DISCUSSION: HEPATOBILIARY: Multiple low-density ill-defined lesions throughout the liver with mild hepatomegaly. Gallbladder mildly  thick-walled but otherwise unremarkable. SPLEEN: No splenomegaly or focal lesion. PANCREAS: Atrophic. ADRENALS: No adrenal nodules. KIDNEYS/URETERS: 17 mm rounded low-density right lower renal pole cystic cyst without hydronephrosis. PELVIC ORGANS/BLADDER: Uterus atrophic or removed. Extensive artifact in pelvis due to right hip replacement. PERITONEUM / RETROPERITONEUM: No free air or fluid. LYMPH NODES: No lymphadenopathy. VESSELS: Heavy calcification in nondilated aorta and iliac vessels. GI TRACT: No distention or wall thickening. No evidence of appendicitis. Large amount retained fecal material in colon and rectum. BONES AND SOFT TISSUES: Extensive lytic destruction of the bony structures throughout spine and pelvis with left hip replacement. Multiple compression fractures of vertebral bodies moderate grade at L3 and low-grade at L2 and L4 without significant retropulsion. Small fracture right superior acetabular margin laterally.     IMPRESSION: 1. 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 Compression fractures L2 through L4. Small acute fracture suspected at superior lateral right acetabular margin. 2. Indeterminate right renal lesion likely represent cyst which could be confirmed with ultrasound. 3. Left hip replacement. 4. Uterus atrophic or removed. 5. Atherosclerotic vascular disease. 6. Constipation.       Microbiology:  All Micro Results     None           Cardiology:  Results for orders placed or performed during the hospital encounter of 08/17/18    EKG, 12 LEAD, INITIAL   Result Value Ref Range    Ventricular Rate 120 BPM    Atrial Rate 120 BPM    P-R Interval 150 ms    QRS Duration 84 ms    Q-T Interval 384 ms    QTC Calculation (Bezet) 542 ms    Calculated P Axis 73 degrees    Calculated R Axis 0 degrees    Calculated T Axis -97 degrees    Diagnosis        Poor data quality, interpretation may be adversely affected  Sinus tachycardia  Septal infarct , age undetermined  T wave abnormality, consider inferior ischemia  T wave abnormality, consider anterolateral ischemia  Prolonged QT  When compared with ECG of 17-Aug-2018 20:03,  Septal infarct is now present  T wave inversion now evident in Inferior leads  T wave inversion now evident in Anterolateral leads  Confirmed by Marrion Coy, M.D., Ellin Saba. ([id number]) on 08/21/2018 11:43:51 AM           Problem List:  Problem List as of 09/02/2018 Never Reviewed          Codes Class Noted - Resolved    Lactic acidosis ICD-10-CM: E87.2  ICD-9-CM: 276.2  08/17/2018 - Present        Altered mental status ICD-10-CM: R41.82  ICD-9-CM: 780.97  08/17/2018 - Present        Metastasis from breast cancer [facility]) ICD-10-CM: C79.9, C50.919  ICD-9-CM: 199.1, 174.9  08/17/2018 - Present              Medications reviewed  Current Facility-Administered Medications   Medication Dose Route Frequency   ??? levalbuterol (XOPENEX) nebulizer soln 1.25 mg/3 mL  1.25 mg Nebulization Q6H PRN   ??? guaiFENesin (ROBITUSSIN) 100 mg/5 mL oral liquid 200 mg  200 mg Oral Q4H PRN   ??? furosemide (LASIX) tablet 20 mg  20 mg Oral DAILY   ??? potassium chloride SR (KLOR-CON 10) tablet 10 mEq  10 mEq Oral DAILY   ??? ibuprofen (MOTRIN) tablet 400 mg  400 mg Oral Q6H PRN   ??? nystatin (MYCOSTATIN) 100,000 unit/mL oral suspension 500,000 Units  500,000 Units Oral QID   ??? levalbuterol (XOPENEX) nebulizer soln 1.25 mg/3 mL  1.25 mg Nebulization Q4H PRN   ??? melatonin tablet 3 mg  3 mg Oral QHS   ??? haloperidol (HALDOL) tablet 1 mg  1 mg Oral QHS PRN   ??? sodium chloride (NS) flush  5-10 mL  5-10 mL IntraVENous Q8H   ??? sodium chloride (NS) flush 5-10 mL  5-10 mL IntraVENous PRN   ??? naloxone (NARCAN) injection 0.1 mg  0.1 mg IntraVENous PRN   ??? acetaminophen (TYLENOL) tablet 650 mg  650 mg Oral Q6H PRN   ??? enoxaparin (LOVENOX) injection 40 mg  40 mg SubCUTAneous Q24H   ??? influenza vaccine 2019-20 (4 yrs+)(PF) (FLUCELVAX QUAD) injection 0.5 mL  0.5 mL IntraMUSCular PRIOR TO DISCHARGE   ??? dexamethasone (DECADRON) 4 mg/mL injection 4 mg  4 mg IntraVENous Q6H         Kristen Loader, MD

## 2018-09-02 NOTE — Progress Notes (Signed)
 Palliative Care RN Progress Note  Palliative Care Team is available Monday-Friday 8 AM-4:30 PM       NAME:  Kayla [name]   DOB:   1941/06/11   MRN:   [id number]     Date/Time Patient Seen:  09/02/2018 10:14 AM    Attending Physician: Lorriane Oliva BRAVO, MD  Primary Care Physician: Danso, Michael A, MD        Palliative Assessment/Plan:     Code Status:  DNR (DDNR signed by MD and placed on chart, children out of town unable to sign)  Goals of Care: Continue current treatment, children understand patients overall prognosis is poor and her only option at this time is to work on increasing her strength/conditiong to potentially be able to tolerate chemotherapy in the next few weeks.  In the meantime they want to focus on PT/OT and increasing her appetite/nutrition.  They also confirmed wanting to change her code status to DNR.      OT/PT working with patient and documenting Patient's rehabilitation potential is considered to be Fair d/t medical complexities and recommend SNF w/ 24/7 supervision    PC will now sign off, GOC are established      Disposition: rehab upon discharge, family not ready for hospice    Current Capacity for Decision Making:  Awake, but lethargic and confused, not decisional      Advance Care Planning:      Advance Directives:          Medical Power of Attorney: none          Living Will: none          POLST forms:  There is no POLST (Physician orders for life-sustaining treatment) form on file for this patient     No Advance Directive: Surrogate Decision Maker: Kayla [name] (daughter) [phone]; Kayla [name] (daughter) [phone]; Kayla [name] (daughter) [phone]; Kayla [name] (son) [phone]          The following were updated:  Discussed with Attending Physician/Provider: Lorriane Oliva BRAVO, MD   Other Physician (consultant): reviewed chart notes   Nursing: RN  Care Manager/Discharge Planner: n/a today      Ronnald Brain RN, BSN, [facility]  Palliative Medicine  [phone] office      The  Palliative Medicine team is available Monday to Friday from 8am to 4:30pm at [phone]    Interval History:       HPI:      HPI: Pt is 77 year old female with breast CA followed Dr. Danso in [place] undergoing systemic hormal therapy with faslodex. Admitted on 08/17/18 for AMS; head/abd/pelv/chest CT in ED worrisome for brain with vasogenic edemia, lung, liver mets; pt undergoing palliative WBR but unable to tolerate lying flat and then later declined to complete simulation mapping to lumbar spine fields per Dr. Marge; planning for x1 dose of faslodex but overall poor prognosis per [facility] Dr. Linnette. Pertinent comorbidities include HTN.  PTA medications per EMR: Lisinopril, fentanyl TD 50mcg/hr, oxycodone IR 10mg  PO Q4hr PRN.  Social History: 4 children  Religious/Cultural/Spiritual: CHRISTIAN  Insurance:  Payor: [employer] / Plan: [facility] [employer] MEDICARE / Product Type: Managed Care Medicare /    ECOG Performance Status (current): 4  Prior ECOG: 4  Palliative Performance Scale (PPS): 10-20%  Prior PPS: 20-30%  FAST Scale in Dementia: n/a  PC Consulted ab:Rnwdlouzi by Dr. Juanna Pinal  on 9/27  for goals of care, metastatic breast cancer     Review of Systems:   Unable  to obtain due to clinical circumstance      Physical Exam:   Blood pressure 135/71, pulse 93, temperature 98.4 F (36.9 C), resp. rate 16, height 5' 6 (1.676 m), weight 56.7 kg (125 lb), SpO2 100 %.  Body mass index is 20.18 kg/m.  Wt Readings from Last 3 Encounters:   08/24/18 56.7 kg (125 lb)     Last Bowel Movement: Last Bowel Movement Date: 09/01/18

## 2018-09-03 MED ORDER — MULTIVIT-MINERAL-IRON-LUTEIN TABLET
ORAL_TABLET | Freq: Every day | ORAL | 0 refills | Status: AC
Start: 2018-09-03 — End: ?

## 2018-09-03 MED ORDER — MELATONIN 3 MG TAB
3 mg | ORAL_TABLET | Freq: Every evening | ORAL | 0 refills | Status: AC
Start: 2018-09-03 — End: ?

## 2018-09-03 MED ORDER — SODIUM CHLORIDE 0.9% BOLUS IV
0.9 % | Freq: Once | INTRAVENOUS | Status: AC
Start: 2018-09-03 — End: 2018-09-03
  Administered 2018-09-03: 15:00:00 via INTRAVENOUS

## 2018-09-03 MED ORDER — DEXAMETHASONE 4 MG TAB
4 mg | ORAL_TABLET | Freq: Four times a day (QID) | ORAL | 0 refills | Status: AC
Start: 2018-09-03 — End: ?

## 2018-09-03 MED ORDER — IBUPROFEN 400 MG TAB
400 mg | ORAL_TABLET | Freq: Four times a day (QID) | ORAL | 0 refills | Status: AC | PRN
Start: 2018-09-03 — End: ?

## 2018-09-03 MED ORDER — ACETAMINOPHEN 325 MG TABLET
325 mg | ORAL_TABLET | ORAL | 0 refills | Status: AC | PRN
Start: 2018-09-03 — End: ?

## 2018-09-03 MED FILL — BD POSIFLUSH NORMAL SALINE 0.9 % INJECTION SYRINGE: INTRAMUSCULAR | Qty: 10

## 2018-09-03 MED FILL — DEXAMETHASONE 4 MG TAB: 4 mg | ORAL | Qty: 1

## 2018-09-03 MED FILL — NYSTATIN 100,000 UNIT/ML ORAL SUSP: 100000 unit/mL | ORAL | Qty: 5

## 2018-09-03 MED FILL — MELATONIN 3 MG TAB: 3 mg | ORAL | Qty: 1

## 2018-09-03 MED FILL — FUROSEMIDE 20 MG TAB: 20 mg | ORAL | Qty: 1

## 2018-09-03 MED FILL — POTASSIUM CHLORIDE SR 10 MEQ TAB: 10 mEq | ORAL | Qty: 1

## 2018-09-03 MED FILL — SODIUM CHLORIDE 0.9 % IV: INTRAVENOUS | Qty: 500

## 2018-09-03 MED FILL — LOVENOX 40 MG/0.4 ML SUBCUTANEOUS SYRINGE: 40 mg/0.4 mL | SUBCUTANEOUS | Qty: 0.4

## 2018-09-03 NOTE — Other (Addendum)
----------  DocumentID: [id number]------------------------------------------------              [facility]                       Patient Education Report         Name: Kayla [name], Kayla [name]                  Date: 08/18/2018    MRN: [id number]                    Time: 12:19:16 AM         Patient ordered video: 'Patient Safety: Stay Safe While you are in the Hospital'    from [id number] via phone number: [phone] at 12:19:16 AM    Description: This program outlines some of the precautions patients can take to ensure a speedy recovery without extra complications. The video emphasizes the importance of communicating with the healthcare team.    ----------DocumentID: [id number]------------------------------------------------                       [facility]          Patient Education Report - Discharge Summary        Date: 09/03/2018   Time: 12:44:34 PM   Name: Kayla [name], Kayla [name]   MRN: [id number]      Account Number: [id number]      Education History:        Patient ordered video: 'Patient Safety: Stay Safe While you are in the Hospital' from [id number] on 08/18/2018 12:19:16 AM

## 2018-09-03 NOTE — Discharge Summary (Signed)
Physician Discharge Summary     Patient ID:  Patient Name: Kayla [name]  Medical Record Number: [id number]  Date of Birth: 10/02/1941  Age: 77 y.o.    Primary Care Provider: Henrene Pastor, MD    Code Status: DNR     Admit date: 08/17/2018    Discharge Date:  09/03/2018    Admitting Physician: Rosalene Billings, MD    Discharge Physician: Kristen Loader, MD    Discharge Disposition: [facility] (SNF)    Activity: PT/OT Eval and Treat    Diet: Mechanical soft diet, Ensure Enlive supplement 1 bottle qLunch/Dinner, Magic cup qDinner.    Wound Care:     R axillary wound (cutaneous metastasis):  Right axillary indurated, discolored site measures 3 x 3 cm.  There is a full-thickness opening.  Patient states she has been putting "a cream" on it.  No drainage noted at this time.  Patient states it does drain and there is an odor present.    ??    ??  Recommendation:    Daily cleansing of the area.  If drainage noted, Mepilex can be applied.  If the site becomes malodorous, recommend Dakin's 1/4 strength dampened gauze daily.       Follow-up appointments:     Follow-up Information     Follow up With Specialties Details [facility] [place]   [address]  [place]        -Oncology: Follow-up with Dr Annette Stable [facility]) upon discharge from SNF      Follow-up recommendations:     ??? Oncology, please re-evaluate pt for continued anti-hormonal therapy or Chemo/Immunotherapy as indicated.      Patient Instructions:   Current Discharge Medication List      START taking these medications    Details   acetaminophen (TYLENOL) 325 mg tablet Take 2 Tabs by mouth every four (4) hours as needed for Pain.  Qty: 30 Tab, Refills: 0      dexAMETHasone (DECADRON) 4 mg tablet Take 4 mg by mouth every six (6) hours.  Qty: 120 Tab, Refills: 0       ibuprofen (MOTRIN) 400 mg tablet Take 1 Tab by mouth every six (6) hours as needed for Pain.  Qty: 30 Tab, Refills: 0      melatonin 3 mg tablet Take 1 Tab by mouth nightly. Indications: difficulty sleeping  Qty: 30 Tab, Refills: 0      multivit-mineral-iron-lutein (WOMEN'S CENTRUM SILVER WITH LUTEIN) tab tablet Take 1 Tab by mouth daily.  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  Patient had not been acting herself for the past week prior to admission.  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 Brent General.  Upon arrival, patient was disoriented did not know where she was.  She was also noted to be dehydrated and with poor p.o. intake.  Patient was admitted to the hospital and evaluated with a CT of the head that revealed multiple brain lesions likely due to metastatic disease with associated vasogenic edema surrounding the masses.  Oncology was consulted who recommended radiation therapy but no chemotherapy at this time as the patient's performance status was very poor.  Dr. Joylene Draft evaluate the patient and started the patient on radiation therapy to the whole brain for 10 fraction course which ended on 10/8.  Patient was also placed on IV Decadron 4 mg every 6 hours which was changed to  oral by the time of discharge.  Patient's mentation improved and she is more cooperative and closer to baseline.  Oncology noted that the patient's performance status was so poor that she only qualified for hormonal therapy but this can only be done as an outpatient.  The patient was given a single dose while she was here of Faslodex.  Oncology noted that to be eligible for any additional chemotherapy or immunotherapy or even the anti-hormonal therapy, the patient would need to be able to have a good enough performance status to make it to the outpatient clinic with Dr. Brent General.  L of care was also consulted who spoke with the family and the patient.  Family noted that there is still a try to be aggressive with the patient's medical care patient was made DNR during these discussions but they know that they would like to try to get the patient physically better and able to make it to the outpatient treatments with oncology.  PT OT saw the patient and they noted that the patient was a good candidate for rehab.  Patient followed commands very well and was cooperative.  The patient was then discharged to SNF for continued PT OT with eventual follow-up with Dr. Brent General when she is discharged from SNF.    Vasogenic cerebral edema??from??brain mass??  Suspected to be metastatic breast cancer  Pt has metabolic encephalopathy from this  Pt is on Decadron 4mg  IV q6h, but will change to PO 10/9  Pt completed a 10 fraction course (3000 cGy) of XRT Mon-Friday (ended 10/8)  Dr Joylene Draft was following and noted that he offered additional XRT to different areas (spine and other areas), but the pt declined further XRT.  She looks better than on admission per the family  Mentation is better than on admission  Prognosis is poor though  ??  Dehydration  Rehydrated.??  Pt became slightly dehydrated again by the time of d/c.  I d/c'd her Lasix and give a bolus of NS.  ??  Dyspnea   Probably mostly metastatic dz as CxR did not show CHF/edema and her lungs are clear on exam.  Pt did respond to Lasix though so there could be some CHF contribution  Added Lasix 20mg  daily (10/1), but d/c'd it by discharge as the pt was with signs of pre-renal azotemia with a BUN that was trending upwards.  ??  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   Family is taking things day by day and want to see what options are available  Per Dr Olene Floss note on 9/27, it looks like it will be just hormonal therapy and XRT.  Onc noted prognosis is poor.  Dr Rexford Maus gave Faslodex on 9/26 and the plan is to continue injections q2 weeks in Dr Manya Silvas office as an outpt.  I spoke with Dr. Aldona Bar of oncology (10/1) who noted that the patient's performance status is poor and until the patient can go to outpatient follow-up with Dr. Brent General, he noted that the patient would not be eligible for any sort of chemotherapy or immunotherapy at this time.    He noted that the patient is only able to tolerate hormonal therapy but also only if she can make it to the outpt appointments (as that is where the medication is dispensed)  ??  Hypercalcemia  S/p Bisphophonate.  Ca came down  She had some hypocalcemia, but that has resolved.  ??  SIRS  Sepsis ruled out  ??  Severe protein calorie malnutrition??  Pt had been with poor PO, but it has been a little better recently  Feed pt at meals  ??  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  Pt was evaluated by Inpt wound care RN who noted a Right axillary indurated, discolored site measuring 3 x 3 cm. ??There is a full-thickness opening. She recommended daily cleansing of the area. If drainage noted, a Mepilex could be applied. ??If the site becomes malodorous they would recommend daily Dakin's 1/4 strength dampened gauze  ??  Generalized weakness   PT/OT while at SNF  ??  Insomnia  Melatonin for sleep  ??  Acute hypoxemic respiratory failure  Transiently noted on 9/30  Thought to be due to pulmonary edema as the pt responded to IV Lasix  Pt required hi flow O2 transiently  Resolved, pt is not requiring O2 at this time  ??  Metabolic encephalopathy.  Due to hypercalcemia and brain metz.  Improved mentation.  Pt has not had any behavioral issues recently.  D/C Haldol PRN  Pt is calm and follows simple commands.  Pt has a baseline dementia    Dementia.  Probably multifactorial.  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  ??  Elevated LFTs and jaundice.  Total bili 5.3, ALT 239, AST 725, AlkPhos 1074 (10/6)  LFTs have been persistently elevated likely due to [facility] to the liver causing intrahepatic obstruction  This will not get better unless she gets XRT or gets Chemo/Immunotherapy.    Code status. DNR  ??  Disposition  Family aware pt is doing poorly, but would like to see if pt can bounce back and get strong enough to get Chemo or Immunotherapy  Pt's XRT ended 10/8  Placement to SNF today for PT/OT    ------------------    65 minutes were spent on the discharge of the patient of which more than 50% was spent in coordination of care and counseling (time spent with patient/family face to face, physical exam, reviewing laboratory and imaging investigations, speaking with physicians and nursing staff involved in this patient's care)    ------------------  Physical exam:  General:  Alert, NAD, pleasant.  Cooperative. Follows commands.  HEENT: Sclera mildly icteric, PERRL, OM moist, throat clear.  Chest:  CTA B, no wheeze, no rales, no rhonchi.  Good air movement.  CV: RRR, S1/S2 were normal, no murmur  Abdomen: NTND, soft, NABS, no masses were noted.  No rebound, no guarding.  Extremities: No edema.    Subjective:     Patient says she feels well.  Denies any chest pain, no shortness of  breath, no fever chills.  She says she is been eating and drinking.  We discussed transfer to SNF and she was amenable with the plan of care        Visit Vitals  BP 162/78 (BP 1 Location: Left arm, BP Patient Position: Supine)   Pulse (!) 102   Temp 97.5 ??F (36.4 ??C)   Resp 16   Ht 5\' 6"  (1.676 m)   Wt 56.7 kg (125 lb)   SpO2 100%   BMI 20.18 kg/m??         Initial presentation and work-up:  [As per Dr  Ivin Poot Jasarevic's H+P from 08/17/2018]  #############  "  Chief Complaint   Patient presents with   ??? Altered mental status   ??  ??  ??  HPI:   Kayla [name] is a 77 y.o. year old female who presents with decreased PO intake, generalized weakness since being placed on oral chemotherapy last week. Today, patient noted to be confused, lethargic. In ED, Patient awake and oriented to person however not time or place. Patient on presentation, tachycardic, and appeared dehydrated.   In ED, Vancomycin and Zosyn initiated.   ??  ??  Review of Systems - 12 Point ROS -ve except what is noted in the HPI.   ??  Past Medical History:  No past medical history on file.  ??  Past Surgical History:  No past surgical history on file.  ??  Family History:  No family history on file.  ??  Social History:  Social History   ??        Socioeconomic History   ??? Marital status: DIVORCED   ?? ?? Spouse name: Not on file   ??? Number of children: Not on file   ??? Years of education: Not on file   ??? Highest education level: Not on file   ??  ??  Home Medications:  Prior to Admission medications    Not on File   ??  ??  Allergies:  No Known Allergies  ??  ??  Physical Exam:   ??  Visit Vitals  BP 129/72   Pulse (!) 105   Temp 98.3 ??F (36.8 ??C)   Resp 13   Ht 5\' 6"  (1.676 m)   Wt 55.3 kg (122 lb)   SpO2 100%   BMI 19.69 kg/m??   ??  ??  Physical Exam:  General appearance: alert, cooperative, no distress, appears stated age  Head: Normocephalic, without obvious abnormality, atraumatic  Neck: supple, trachea midline  Lungs: clear to auscultation bilaterally   Heart: regular rate and rhythm, S1, S2 normal, no murmur, click, rub or gallop  Abdomen: soft, non-tender. Bowel sounds normal. 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  Recent Labs     09/02/18  0619 09/02/18  0618 09/01/18  0724 08/31/18  1233   NA  --  140  --   --    K  --  4.2  --   --    CL  --  102  --   --    CO2  --  28  --   --    GLU  --  166*  --   --    BUN  --  41*  --   --    CREA  --  0.8  --   --    CA  --  8.3*  --   --    MG 2.5  --  2.2 2.2   PHOS  --  3.5  --   --    ALB  --  2.4*  --   --      No results for input(s): PH, PCO2, PO2, HCO3, FIO2 in the last 72 hours.    24 Hour Results:  No results found for this or any previous visit (from the past 24 hour(s)).    Xr Chest Sngl V    Result Date: 08/24/2018  INDICATION: Wheezing, SOB   EXAMINATION: XR CHEST SNGL V COMPARISON: 08/17/2018 FINDINGS: Mild cardiomegaly.. Small bilateral pleural effusions. Multiple focal lung lesions bilaterally. Diffuse osteolytic bony changes..        IMPRESSION: 1. Mild cardiomegaly. 2. Small bilateral pleural effusions. 3. Multiple pulmonary nodules consistent with metastases. 4. Diffuse osteolytic lesions consistent with bony metastases.      Xr Chest Sngl V    Result Date: 08/17/2018  Indication: Short of breath Frontal view chest Heart mildly enlarged. Shallow inspiration with interstitial and patchy alveolar densities throughout the lungs. Anterior cervical fusion at 3 adjacent lower cervical levels. Degenerative narrowing and sclerosis bilateral humeral joint. This rotated to the right.     IMPRESSION: Edema versus multifocal pneumonia or chronic changes bilaterally. Cardiomegaly. Postsurgical changes cervical spine.     Ct Head Wo Cont    Result Date: 08/17/2018  DICOM format image data is available to nonaffiliated external healthcare facilities or entities on a secure, media free, reciprocally searchable basis with patient authorization for 12 months following the date of the study. Indication: Altered mental status Technique:  5 millimeter axial images without contrast were obtained through the brain. Comparison: none Findings:   There is generalized moderate cerebral atrophy.  Low attenuation surrounds the lateral ventricles. There is a rounded 16 x 14 mm mass in the right thalamus surrounding low-density edema. The mass is hyperdense peripherally and low density centrally. There is also focal low-density in the right parietal white matter suggesting vasogenic edema. Focal low-density seen more laterally in the right parietal cortex measuring 2.2 cm diameter. Apparent 2.6 cm x 2.2 cm mass is seen in the right cerebellum with surrounding low-density suggesting vasogenic edema. Asymmetric 11 mm low-density irregular lytic lesion is seen in the diploic space of the right parietal bone. No hemorrhage seen. Sella,  pineal, craniocervical junction, orbits, paranasal sinuses, and temporal bones are unremarkable.      Impression: cerebral atrophy and nonspecific white matter changes not unusual for age; multiple lesions as described in brain and calvarium worrisome for metastatic disease on this noncontrast study. Contrast MRI  suggested for further evaluation.     Cta Chest W Or W Wo Cont    Result Date: 08/17/2018  DICOM format image data is available to nonaffiliated external healthcare facilities or entities on a secure, media free, reciprocally searchable basis with patient authorization for 12 months following the date of the study. Indication: Altered mental status Technique: 3 millimeter axial images with IV contrast obtained from lung apices to bases. 3-D MIP CTA images obtained. Comparison: Frontal view chest 08/17/2018 Findings:  Mediastinum: No aortic dissection or pulmonary embolus. Calcified aortic arch and mild coronary calcifications. 10 mm low-density focus right thyroid lobe. Mild cardiomegaly. Small bilateral pleural effusions. Lungs: Numerous nodules of varying sizes throughout the lungs largest of which left lower lobe measures 18 x 19 mm. Focal pleural thickening adjacent to rib destruction right lower lobe posterolaterally. Bones/ chest wall: Extensive lytic lesions throughout the skeletal system of the thorax including sternum, spine, and ribs. High-grade compression fractures T3 and T4 with mild retropulsion. Anterior cervical fusion. Multiple masses are seen throughout the right breast the largest of which is partially imaged laterally at 6.8 x 3.5 cm; there is overlying right breast skin thickening. Asymmetric upper normal size nodes are seen in the right axilla. Below the diaphragm liver is heterogeneously low in density.     Impression: 1. No evidence of pulmonary embolus or aortic dissection. 2. 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 3. Indeterminate lesion right thyroid lobe which could be evaluated with ultrasound. 4. Cardiomegaly. 5. Bilateral small pleural effusions. 6. Multiple lesions throughout lungs and bones consistent with metastatic disease possibly from breast primary with asymmetric multi centric lesions  throughout the right breast and overlying skin thickening with asymmetric upper normal size right axillary nodes. 7. Compression fractures T3 and T4 with retropulsion. 8. Heterogeneous liver. Metastatic disease not excluded.     Ct Abd Pelv W Cont    Result Date: 08/17/2018  DICOM format image data is available to nonaffiliated external healthcare facilities or entities on a secure, media free, reciprocally searchable basis with patient authorization for 12 months following the date of the study. TECHNIQUE: CT of the abdomen and pelvis WITH intravenous contrast.  The abdomen and pelvis were scanned utilizing a multidetector helical scanner from the diaphragm to the lesser trochanter without the oral administration of barium.  Coronal and sagittal reformations were obtained. COMPARISON: none INDICATION: Altered mental status DISCUSSION: HEPATOBILIARY: Multiple low-density ill-defined lesions throughout the liver with mild hepatomegaly. Gallbladder mildly thick-walled but otherwise unremarkable. SPLEEN: No splenomegaly or focal lesion. PANCREAS: Atrophic. ADRENALS: No adrenal nodules. KIDNEYS/URETERS: 17 mm rounded low-density right lower renal pole cystic cyst without hydronephrosis. PELVIC ORGANS/BLADDER: Uterus atrophic or removed. Extensive artifact in pelvis due to right hip replacement. PERITONEUM / RETROPERITONEUM: No free air or fluid. LYMPH NODES: No lymphadenopathy. VESSELS: Heavy calcification in nondilated aorta and iliac vessels. GI TRACT: No distention or wall thickening. No evidence of appendicitis. Large amount retained fecal material in colon and rectum. BONES AND SOFT TISSUES: Extensive lytic destruction of the bony structures throughout spine and pelvis with left hip replacement. Multiple compression fractures of vertebral bodies moderate grade at L3 and low-grade at L2 and L4 without significant retropulsion. Small fracture right superior  acetabular margin laterally.      IMPRESSION: 1. 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 Compression fractures L2 through L4. Small acute fracture suspected at superior lateral right acetabular margin. 2. Indeterminate right renal lesion likely represent cyst which could be confirmed with ultrasound. 3. Left hip replacement. 4. Uterus atrophic or removed. 5. Atherosclerotic vascular disease. 6. Constipation.       Microbiology:  All Micro Results     None           Cardiology:  Results for orders placed or performed during the hospital encounter of 08/17/18   EKG, 12 LEAD, INITIAL   Result Value Ref Range    Ventricular Rate 120 BPM    Atrial Rate 120 BPM    P-R Interval 150 ms    QRS Duration 84 ms    Q-T Interval 384 ms    QTC Calculation (Bezet) 542 ms    Calculated P Axis 73 degrees    Calculated R Axis 0 degrees    Calculated T Axis -97 degrees    Diagnosis        Poor data quality, interpretation may be adversely affected  Sinus tachycardia  Septal infarct , age undetermined  T wave abnormality, consider inferior ischemia  T wave abnormality, consider anterolateral ischemia  Prolonged QT  When compared with ECG of 17-Aug-2018 20:03,  Septal infarct is now present  T wave inversion now evident in Inferior leads  T wave inversion now evident in Anterolateral leads  Confirmed by Marrion Coy, M.D., Ellin Saba. 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

## 2018-09-03 NOTE — Other (Signed)
Bedside shift change report given to Yetta Flock, RN (Soil scientist) by Dyann Ruddle, RN (offgoing nurse). Report included the following information SBAR, Kardex and MAR.

## 2018-09-03 NOTE — Progress Notes (Signed)
physical Therapy treatment    Patient: Kayla [name] (77 y.o. 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  Weight bearing precautions: None      ASSESSMENT :  Based on the objective data described below, the patient presents with  Able to participate with  standing, limited by confusion today  Generalized muscle weakness affecting function   Decreased Strength  Decreased ADL/Functional Activities  Decreased Transfer Abilities  Decreased Ambulation Ability/Technique  Decreased Balance  Increased Pain  Decreased Activity Tolerance.      Patient???s rehabilitation potential is considered to be Good     Recommendations:  Physical Therapy  Discharge Recommendations: SNF, 24 hour supervision for safety, Home with home health PT and TBD, pending progress  Further Equipment Recommendations for Discharge: None        PLAN :  Planned Interventions:  Functional mobility training Gait Training Stair training Balance Training Therapeutic exercises Therapeutic activities Neuro muscular re-education AD training Patient/caregiver education      Frequency/Duration: Patient will be followed by physical therapy 3x / Week x4weeks to address goals.                 Orders reviewed, chart [facility] on [facility].    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 Back pain     ??? Breast cancer [facility])          Patient found: Bed, IV and no chair alarm.    Pain Assessment before PT session: Not rated  Pain Location: all over per pt  Pain Assessment after PT session: Not rated  Pain Location:    []           Yes, patient had pain medications  []           No, Patient has not had pain medications  []           Nurse notified    COGNITIVE STATUS:     Mental Status: Oriented to, person, place and confused.  Communication: normal.  Follows commands: intact.  General Cognition: impaired safety and impulsivity.  Hearing: grossly intact.  Vision:  grossly intact.            Functional mobility and balance status:     Supine to sit -  min. assist  Sit to Supine -  min. assist  Sit to Stand -  mod. assist, max. assist, verbal cues, safety concerns, increased time and 1 person  Stand to Sit -  mod. assist, max. assist, verbal cues, manual cues, visual cues, set-up, safety concerns, increased time and 1 person   Unable to ambulate today, pt limited  by cognition          Activity Tolerance:   fair    Final Location:   bed, bed alarm, all needs close, agrees to call for assistance and nurse notified     COMMUNICATION/EDUCATION:   Education: Patient, Benefit of activity while hospitalized, Call for assistance, Out of bed 2-3 times/day, Staff assistance with mobility, Changes positions frequently, Sit out of bed for 45-60 minutes or as tolerated, Safety, Functional mobility, Demonstrates adequately, Verbalized understanding, Teaching method, Verbal and Role of PT  Barriers to Learning/Limitations: None    Please refer to care plan and patient education section for further details.    Thank you for this referral.  Luan Pulling, PT, DPT  Pager [phone]

## 2018-09-03 NOTE — Progress Notes (Addendum)
Discharge Plan:   To go to rehab short term Jose[facility] Kaufmann is in. Dc instructions per MD and dc RN to staff/ see epic for follow up care as noted. RN Cyndee Brightly to call report to SNF> asked MD for dc.    Discharge Date:     09/03/2018         The facility confirmed that they are ready to receive the patient:     Yes/ the CM spoke with  [name] she left message [facility] is in    Is patient going to snf? Yes [facility]    If patient is dialysis , Is dialysis set up needed if going to snf? n/a    Wound vac needed at time of dc to snf n/a    BIPAP needed at snf none    FOC given :  yes    Transportation: medical will ask [facility]PA to set up    Transport Time:   7    Family, MD, patient, nurse and facility aware of pickup time    Paged MD for SNF dc  dtr here aware of dc today and has address and phone number of SNF that she chose. Aware of medicare ratings and asked if they did not like it could they move mom informed her she will need to speak with SW there about moving pt if not happy once gets there. When called dtr to give list of accepting SNFS informed her she could review medicare website for rating info she said she would just look up the ones in [place] to make her choice and she then called back and accepted [facility] and then they were asked to get auth.  [name] Daughter Primary Decision Maker  [phone] [facility]) [phone]     Called her to inform. Of dc time.

## 2018-09-03 NOTE — Progress Notes (Addendum)
[facility] called and they got auth for SNF> will page MD for dc./ PAGER ID: [phone]   MESSAGE: Dr Nira Retort [phone] LM we have auth she can go to [facility]. we will need transfer summary and any narcotics on hard script. they would like to get her early ty [place]

## 2018-09-03 NOTE — Other (Signed)
TRANSFER - OUT REPORT:    Verbal report given to Sharlica(name) on Kayla [name]  being transferred to [facility](unit) for routine progression of care       Report consisted of patient???s Situation, Background, Assessment and   Recommendations(SBAR).     Information from the following report(s) SBAR, Kardex, Intake/Output and MAR was reviewed with the receiving nurse.    Lines:       Opportunity for questions and clarification was provided.      Patient transported with:   Ambulance transport

## 2018-09-03 NOTE — Progress Notes (Addendum)
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

## 2018-09-03 NOTE — Progress Notes (Signed)
Discharge Plan:   To go to rehab short term Kayla [name] is in. Dc instructions per MD and dc RN to staff/ see epic for follow up care as noted. RN Cyndee Brightly to call report to SNF> asked MD for dc.    Discharge Date:     09/03/2018         The facility confirmed that they are ready to receive the patient:     Yes/ the CM spoke with  [name] she left message Kayla [name] is in    Is patient going to snf? Yes [facility]    If patient is dialysis , Is dialysis set up needed if going to snf? n/a    Wound vac needed at time of dc to snf n/a    BIPAP needed at snf none    FOC given :  yes    Transportation: medical will ask [facility] to set up    Transport Time:   69    Family, MD, patient, nurse and facility aware of pickup time    Paged MD for SNF dc  dtr here aware of dc today and has address and phone number of SNF that she chose. Aware of medicare ratings and asked if they did not like it could they move mom informed her she will need to speak with SW there about moving pt if not happy once gets there. When called dtr to give list of accepting SNFS informed her she could review medicare website for rating info she said she would just look up the ones in [place] to make her choice and she then called back and accepted [facility] and then they were asked to get auth.  Kayla [name] Daughter Primary Decision Maker  [phone] [facility]) [phone]     Called her to inform. Of dc time.

## 2018-09-03 NOTE — Progress Notes (Signed)
[facility] called and they got auth for SNF> will page MD for dc./ PAGER ID: [phone]   MESSAGE: Dr Nira Retort [phone] LM we have auth she can go to [facility]. we will need transfer summary and any narcotics on hard script. they would like to get her early ty [place]

## 2018-09-03 NOTE — Discharge Summary (Signed)
Discharge Summary by Kristen Loader, MD at 09/03/18 1011                Author: Kristen Loader, MD  Service: Hospitalist  Author Type: Physician       Filed: 09/03/18 1120  Date of Service: 09/03/18 1011  Status: Signed          Editor: Kristen Loader, MD (Physician)                 Physician Discharge Summary        Patient ID:   Patient Name: Kayla [name]   Medical Record Number: [id number]   Date of Birth: 1941-05-03   Age: 77 y.o.      Primary Care Provider: Henrene Pastor, MD      Code Status: DNR       Admit date: 08/17/2018      Discharge Date:  09/03/2018      Admitting Physician: Rosalene Billings, MD      Discharge Physician: Kristen Loader, MD      Discharge Disposition: [facility] (SNF)      Activity: PT/OT Eval and Treat      Diet:  Mechanical soft diet, Ensure Enlive supplement 1 bottle qLunch/Dinner, Magic cup qDinner.      Wound Care:       R axillary wound (cutaneous metastasis):   Right axillary indurated, discolored site measures 3 x 3 cm.  There is a full-thickness opening.  Patient states she has been putting "a cream" on it.  No drainage noted at  this time.  Patient states it does drain and there is an odor present.     ??        ??   Recommendation:     Daily cleansing of the area.  If drainage noted, Mepilex can be applied.  If the site becomes malodorous, recommend Dakin's 1/4 strength dampened gauze daily.          Follow-up appointments:         Follow-up Information               Follow up With  Specialties  Details  Why  [place]      [address]   [place]             -Oncology: Follow-up with Dr Annette Stable [facility]) upon discharge from SNF         Follow-up recommendations:       ? Oncology, please re-evaluate pt for continued anti-hormonal therapy or Chemo/Immunotherapy as indicated.         Patient Instructions:      Current  Discharge Medication List              START taking these medications          Details        acetaminophen (TYLENOL) 325 mg tablet  Take 2 Tabs by mouth every four (4) hours as needed for Pain.   Qty: 30 Tab, Refills: 0               dexAMETHasone (DECADRON) 4 mg tablet  Take 4 mg by mouth every six (6) hours.   Qty: 120 Tab, Refills: 0  ibuprofen (MOTRIN) 400 mg tablet  Take 1 Tab by mouth every six (6) hours as needed for Pain.   Qty: 30 Tab, Refills: 0               melatonin 3 mg tablet  Take 1 Tab by mouth nightly. Indications: difficulty sleeping   Qty: 30 Tab, Refills: 0               multivit-mineral-iron-lutein (WOMEN'S CENTRUM SILVER WITH LUTEIN) tab tablet  Take 1 Tab by mouth daily.   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  Patient had not been acting herself for the past week prior to admission.  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 Brent General.  Upon arrival, patient was disoriented did not know where she was.  She was also noted to be dehydrated and with poor p.o. intake.  Patient was admitted to the hospital and evaluated with a  CT of the head that revealed multiple brain lesions  likely due to metastatic disease with associated vasogenic edema surrounding the masses.  Oncology was consulted who recommended radiation therapy but no chemotherapy at this time as the patient's performance  status was very poor.  Dr. Joylene Draft evaluate the patient and started the patient on radiation therapy to the whole brain for 10 fraction course which ended on 10/8.  Patient was also placed on IV Decadron 4 mg every 6 hours which was changed to  oral by the time of discharge.  Patient's mentation improved and she is more cooperative and closer to baseline.  Oncology noted that the patient's performance status was so poor that she only qualified for hormonal therapy but this can only be done as  an outpatient.  The patient was given a single dose while she was here of Faslodex.  Oncology noted that to be eligible for any additional chemotherapy or immunotherapy or even the anti-hormonal therapy, the patient would need to be able to have a good  enough performance status to make it to the outpatient clinic with Dr. Brent General.  L of care was also  consulted who spoke with the family and the patient.  Family noted that there is still a try to be aggressive with the patient's medical care patient was  made DNR during these discussions but they know that they would like to try to get the patient physically better and able to make it to the outpatient treatments with oncology.  PT OT saw the patient and they noted that the patient was a good candidate  for rehab.  Patient followed commands very well and was cooperative.  The patient was then discharged to SNF for continued PT OT with eventual follow-up with Dr. Brent General when she is discharged from SNF.      Vasogenic cerebral edema??from??brain mass??   Suspected to be metastatic breast cancer   Pt has metabolic encephalopathy from this   Pt is on Decadron 4mg  IV q6h, but will change to PO 10/9   Pt completed a 10 fraction course (3000 cGy) of XRT Mon-Friday (ended  10/8)   Dr Joylene Draft was following and noted that he offered additional XRT to different areas (spine and other areas), but the pt declined further XRT.   She looks better than on admission per the family   Mentation is better than on admission   Prognosis is poor though   ??   Dehydration   Rehydrated.??   Pt became slightly dehydrated again by the time of d/c.   I d/c'd her Lasix and give a bolus of NS.   ??   Dyspnea   Probably mostly metastatic dz as CxR did not show CHF/edema and her lungs are clear on exam.   Pt did respond to Lasix though so there could be some CHF contribution   Added Lasix 20mg  daily (10/1), but d/c'd it by discharge as the pt was with signs of pre-renal azotemia with a BUN that was trending upwards.   ??   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    Family is taking things day by day and want to see what options are available   Per Dr Olene Floss note on 9/27, it looks like it will be just hormonal therapy and XRT.   Onc noted prognosis is poor.   Dr Rexford Maus gave Faslodex on 9/26 and the plan is to continue injections q2 weeks in Dr Manya Silvas office as an outpt.   I spoke with Dr. Aldona Bar of oncology (10/1) who noted that the patient's performance status is poor and until the patient can go to outpatient follow-up with Dr. Brent General, he noted that the patient would not be eligible for any sort of chemotherapy or  immunotherapy at this time.     He noted that the patient is only able to tolerate hormonal therapy but also only if she can make it to the outpt appointments (as that is where the medication is dispensed)   ??   Hypercalcemia   S/p Bisphophonate.   Ca came down   She had some hypocalcemia, but that has resolved.   ??   SIRS   Sepsis ruled out   ??   Severe protein calorie malnutrition??   Pt had been with poor PO, but it has been a little better  recently   Feed pt at meals   ??   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  Pt was evaluated by Inpt wound care RN who noted a Right axillary indurated, discolored site measuring 3 x 3 cm. ??There is a full-thickness opening. She recommended daily cleansing of the area. If drainage noted, a Mepilex could be applied. ??If  the site becomes malodorous they would recommend daily Dakin's 1/4 strength dampened gauze   ??   Generalized weakness   PT/OT while at SNF   ??   Insomnia   Melatonin for sleep   ??   Acute hypoxemic respiratory failure   Transiently noted on 9/30   Thought to be due to pulmonary edema as the pt responded to IV Lasix   Pt required hi flow O2 transiently   Resolved, pt is not requiring O2 at this time   ??   Metabolic encephalopathy.   Due to hypercalcemia and brain metz.   Improved mentation.   Pt has not had any behavioral issues recently.   D/C Haldol PRN   Pt is calm and follows simple commands.   Pt has a baseline dementia      Dementia.   Probably multifactorial.   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   ??   Elevated LFTs and jaundice.   Total bili 5.3, ALT 239, AST 725, AlkPhos 1074 (10/6)   LFTs have been persistently elevated likely due to [facility] to the liver causing intrahepatic obstruction   This will not get better unless she gets XRT or gets Chemo/Immunotherapy.      Code status. DNR   ??   Disposition   Family aware pt is doing poorly, but would like to see if pt can bounce back and get strong enough to get Chemo or Immunotherapy   Pt's XRT ended 10/8   Placement to SNF today for PT/OT      ------------------      65 minutes were spent on the discharge of the patient of which more than 50% was spent in coordination of care and counseling (time spent with patient/family face to face, physical exam, reviewing laboratory and imaging investigations, speaking with physicians  and nursing staff involved in this patient's care)       ------------------   Physical exam:   General:  Alert, NAD, pleasant.  Cooperative. Follows commands.   HEENT: Sclera mildly icteric, PERRL, OM moist, throat clear.   Chest:  CTA B, no wheeze, no rales, no rhonchi.  Good air movement.   CV: RRR, S1/S2 were normal, no murmur   Abdomen: NTND, soft, NABS, no masses were noted.  No rebound, no guarding.   Extremities: No edema.        Subjective:        Patient says she feels well.  Denies any chest pain, no shortness of breath, no fever chills.  She says she is been eating and drinking.  We discussed transfer to SNF and she was amenable with the plan of care            Visit Vitals      BP  162/78 (BP 1 Location: Left arm, BP Patient Position: Supine)     Pulse  (!) 102     Temp  97.5 ??F (36.4 ??C)     Resp  16     Ht  5\' 6"  (1.676 m)     Wt  56.7 kg (125 lb)     SpO2  100%        BMI  20.18 kg/m??  Initial presentation and work-up:   [As per Dr  Ivin Poot Jasarevic's H+P from 08/17/2018]   #############   "     Chief Complaint       Patient presents with        ?  Altered mental status     ??   ??   ??     HPI:     Yulia Ulrich is a 77 y.o. year old female who presents with decreased PO intake, generalized weakness since being placed on oral chemotherapy  last week. Today, patient noted to be confused, lethargic. In ED, Patient awake and oriented to person however not time or place. Patient on presentation, tachycardic, and appeared dehydrated.    In ED, Vancomycin and Zosyn initiated.    ??   ??   Review of Systems - 12 Point ROS -ve except what is noted in the HPI.    ??   Past Medical History:   No past medical history on file.   ??   Past Surgical History:   No past surgical history on file.   ??   Family History:   No family history on file.   ??   Social History:     Social History     ??                     Socioeconomic History         ?  Marital status:  DIVORCED          ??  ??  Spouse name:  Not on file         ?  Number of children:  Not on file     ?   Years of education:  Not on file     ?  Highest education level:  Not on file     ??   ??   Home Medications:     Prior to Admission medications        Not on File     ??   ??   Allergies:   No Known Allergies   ??   ??     Physical Exam:     ??   Visit Vitals      BP  129/72     Pulse  (!) 105     Temp  98.3 ??F (36.8 ??C)     Resp  13     Ht  5\' 6"  (1.676 m)     Wt  55.3 kg (122 lb)     SpO2  100%     BMI  19.69 kg/m??     ??   ??   Physical Exam:   General appearance: alert, cooperative, no distress, appears stated age   Head: Normocephalic, without obvious abnormality, atraumatic   Neck: supple, trachea midline   Lungs: clear to auscultation bilaterally   Heart: regular rate and rhythm, S1, S2 normal, no murmur, click, rub or gallop   Abdomen: soft, non-tender. Bowel sounds normal. 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 ([place]) [C79.9, C50.919]      CHRONIC MEDICAL DIAGNOSES:      Problem List as of 09/03/2018  Never Reviewed  Codes  Class  Noted - Resolved             Lactic acidosis  ICD-10-CM: E87.2   ICD-9-CM: 276.2    08/17/2018 - Present                       Altered mental status  ICD-10-CM: R41.82   ICD-9-CM: 780.97    08/17/2018 - Present                       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     Recent Labs              09/02/18   0619  09/02/18   0618  09/01/18   0724  08/31/18   1233     NA   --   140   --    --      K   --   4.2   --    --      CL   --   102   --    --      CO2   --   28   --    --      GLU   --   166*   --     --      BUN   --   41*   --    --      CREA   --   0.8   --    --      CA   --   8.3*   --    --      MG  2.5   --   2.2  2.2     PHOS   --   3.5   --    --            ALB   --   2.4*   --    --         No results for input(s): PH, PCO2, PO2, HCO3, FIO2 in the last 72 hours.      24 Hour Results:   No results found for this or any previous visit (from the past 24 hour(s)).      Xr Chest Sngl V      Result Date: 08/24/2018   INDICATION: Wheezing, SOB   EXAMINATION: XR CHEST SNGL V COMPARISON: 08/17/2018 FINDINGS: Mild cardiomegaly.. Small bilateral pleural effusions. Multiple focal lung lesions bilaterally. Diffuse osteolytic bony changes..          IMPRESSION: 1. Mild cardiomegaly. 2. Small bilateral pleural effusions. 3. Multiple pulmonary nodules consistent with metastases. 4. Diffuse osteolytic lesions consistent with bony metastases.       Xr Chest Sngl V      Result Date: 08/17/2018   Indication: Short of breath Frontal view chest Heart mildly enlarged. Shallow inspiration with interstitial and patchy alveolar densities throughout the lungs. Anterior cervical fusion at 3 adjacent lower cervical levels.  Degenerative narrowing and sclerosis  bilateral humeral joint. This rotated to the right.       IMPRESSION: Edema versus multifocal pneumonia or chronic changes bilaterally. Cardiomegaly. Postsurgical changes cervical spine.       Ct Head Wo Cont      Result Date: 08/17/2018   DICOM format image data is available to nonaffiliated external healthcare facilities or entities on a secure, media free, reciprocally searchable basis with patient authorization for 12 months following the date of the study. Indication: Altered mental  status Technique:  5 millimeter axial images without contrast were obtained through the brain. Comparison: none Findings:   There is generalized moderate cerebral atrophy.  Low attenuation surrounds the lateral ventricles. There is a rounded 16 x 14 mm  mass in the right thalamus surrounding  low-density edema. The mass is hyperdense peripherally and low density centrally. There is also focal low-density in the right parietal white matter suggesting vasogenic edema. Focal low-density seen more laterally  in the right parietal cortex measuring 2.2 cm diameter. Apparent 2.6 cm x 2.2 cm mass is seen in the right cerebellum with surrounding low-density suggesting vasogenic edema. Asymmetric 11 mm low-density irregular lytic lesion is seen in the diploic space  of the right parietal bone. No hemorrhage seen. Sella, pineal, craniocervical junction, orbits, paranasal sinuses, and temporal bones are unremarkable.        Impression: cerebral atrophy and nonspecific white matter changes not unusual for age; multiple lesions as described in brain and calvarium worrisome for metastatic disease on this noncontrast study. Contrast MRI suggested for further evaluation.       Cta Chest W Or W Wo Cont      Result Date: 08/17/2018   DICOM format image data is available to nonaffiliated external healthcare facilities or entities on a secure, media free, reciprocally searchable basis with patient authorization for 12 months following the date of the study. Indication: Altered mental  status Technique: 3 millimeter axial images with IV contrast obtained from lung apices to bases. 3-D MIP CTA images obtained. Comparison: Frontal view chest 08/17/2018 Findings:  Mediastinum: No aortic dissection or pulmonary embolus. Calcified aortic  arch and mild coronary calcifications. 10 mm low-density focus right thyroid lobe. Mild cardiomegaly. Small bilateral pleural effusions. Lungs: Numerous nodules of varying sizes throughout the lungs largest of which left lower lobe measures 18 x 19 mm.  Focal pleural thickening adjacent to rib destruction right lower lobe posterolaterally. Bones/ chest wall: Extensive lytic lesions throughout the skeletal system of the thorax including sternum, spine, and ribs. High-grade compression fractures T3  and  T4 with mild retropulsion. Anterior cervical fusion. Multiple masses are seen throughout the right breast the largest of which is partially imaged laterally at 6.8 x 3.5 cm; there is overlying right breast skin thickening. Asymmetric upper normal size  nodes are seen in the right axilla. Below the diaphragm liver is heterogeneously low in density.       Impression: 1. No evidence of pulmonary embolus or aortic dissection. 2. 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 3. Indeterminate lesion right thyroid lobe which could be evaluated with ultrasound. 4. Cardiomegaly. 5. Bilateral  small pleural effusions. 6. Multiple lesions throughout lungs and bones consistent with metastatic disease possibly from breast primary with asymmetric multi centric lesions throughout the right breast and overlying skin thickening with asymmetric upper  normal size right axillary nodes. 7. Compression fractures T3 and T4 with retropulsion. 8. Heterogeneous liver. Metastatic disease not excluded.  Ct Abd Pelv W Cont      Result Date: 08/17/2018   DICOM format image data is available to nonaffiliated external healthcare facilities or entities on a secure, media free, reciprocally searchable basis with patient authorization for 12 months following the date of the study. TECHNIQUE: CT of the abdomen  and pelvis WITH intravenous contrast.  The abdomen and pelvis were scanned utilizing a multidetector helical scanner from the diaphragm to the lesser trochanter without the oral administration of barium.  Coronal and sagittal reformations were obtained.  COMPARISON: none INDICATION: Altered mental status DISCUSSION: HEPATOBILIARY: Multiple low-density ill-defined lesions throughout the liver with mild hepatomegaly. Gallbladder mildly thick-walled but otherwise unremarkable. SPLEEN: No splenomegaly or  focal lesion. PANCREAS: Atrophic. ADRENALS: No adrenal nodules. KIDNEYS/URETERS: 17 mm rounded low-density right lower  renal pole cystic cyst without hydronephrosis. PELVIC ORGANS/BLADDER: Uterus atrophic or removed. Extensive artifact in pelvis due to  right hip replacement. PERITONEUM / RETROPERITONEUM: No free air or fluid. LYMPH NODES: No lymphadenopathy. VESSELS: Heavy calcification in nondilated aorta and iliac vessels. GI TRACT: No distention or wall thickening. No evidence of appendicitis. Large  amount retained fecal material in colon and rectum. BONES AND SOFT TISSUES: Extensive lytic destruction of the bony structures throughout spine and pelvis with left hip replacement. Multiple compression fractures of vertebral bodies moderate grade at  L3 and low-grade at L2 and L4 without significant retropulsion. Small fracture right superior acetabular margin laterally.       IMPRESSION: 1. 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 Compression fractures L2 through L4. Small acute fracture suspected at superior lateral right acetabular margin. 2. Indeterminate right renal lesion likely represent cyst which  could be confirmed with ultrasound. 3. Left hip replacement. 4. Uterus atrophic or removed. 5. Atherosclerotic vascular disease. 6. Constipation.          Microbiology:     All Micro Results           None                   Cardiology:     Results for orders placed or performed during the hospital encounter of 08/17/18     EKG, 12 LEAD, INITIAL         Result  Value  Ref Range            Ventricular Rate  120  BPM       Atrial Rate  120  BPM       P-R Interval  150  ms       QRS Duration  84  ms       Q-T Interval  384  ms       QTC Calculation ([place]zet)  542  ms       Calculated P Axis  73  degrees       Calculated R Axis  0  degrees       Calculated T Axis  -97  degrees       Diagnosis                  Poor data quality, interpretation may be adversely affected   Sinus tachycardia   Septal infarct , age undetermined   T wave abnormality, consider inferior ischemia   T wave abnormality, consider anterolateral  ischemia   Prolonged QT   When compared with ECG of 17-Aug-2018 20:03,   Septal infarct is now present   T wave inversion now evident in Inferior leads  T wave inversion now evident in Anterolateral leads   Confirmed by Marrion Coy, M.D., Ellin Saba. 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

## 2018-09-03 NOTE — Progress Notes (Signed)
physical Therapy treatment    Patient: Kayla [name] (77 y.o. 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  Weight bearing precautions: None      ASSESSMENT :  Based on the objective data described below, the patient presents with  Able to participate with  standing, limited by confusion today  Generalized muscle weakness affecting function   Decreased Strength  Decreased ADL/Functional Activities  Decreased Transfer Abilities  Decreased Ambulation Ability/Technique  Decreased Balance  Increased Pain  Decreased Activity Tolerance.      Patient's rehabilitation potential is considered to be Good     Recommendations:  Physical Therapy  Discharge Recommendations: SNF, 24 hour supervision for safety, Home with home health PT and TBD, pending progress  Further Equipment Recommendations for Discharge: None        PLAN :  Planned Interventions:  Functional mobility training Gait Training Stair training Balance Training Therapeutic exercises Therapeutic activities Neuro muscular re-education AD training Patient/caregiver education      Frequency/Duration: Patient will be followed by physical therapy 3x / Week x4weeks to address goals.                 Orders reviewed, chart reviewed on [facility].    SUBJECTIVE:   Patient: agreed to PT    OBJECTIVE DATA SUMMARY:   Present illness history:   Problem List  Never Reviewed          Codes Class Noted    Lactic acidosis ICD-10-CM: E87.2  ICD-9-CM: 276.2  08/17/2018        Altered mental status ICD-10-CM: R41.82  ICD-9-CM: 780.97  08/17/2018        Metastasis from breast cancer [facility]) ICD-10-CM: C79.9, C50.919  ICD-9-CM: 199.1, 174.9  08/17/2018             Past Medical history:   Past Medical History:   Diagnosis Date   . Back pain    . Breast cancer [facility])           Patient found: Bed, IV and no chair alarm.    Pain Assessment before PT session: Not rated  Pain Location: all over per pt  Pain Assessment after PT session: Not rated  Pain Location:    []           Yes, patient had pain medications  []           No, Patient has not had pain medications  []           Nurse notified    COGNITIVE STATUS:     Mental Status: Oriented to, person, place and confused.  Communication: normal.  Follows commands: intact.  General Cognition: impaired safety and impulsivity.  Hearing: grossly intact.  Vision:  grossly intact.            Functional mobility and balance status:     Supine to sit -  min. assist  Sit to Supine -  min. assist  Sit to Stand -  mod. assist, max. assist, verbal cues, safety concerns, increased time and 1 person  Stand to Sit -  mod. assist, max. assist, verbal cues, manual cues, visual cues, set-up, safety concerns, increased time and 1 person   Unable to ambulate today, pt limited  by cognition          Activity Tolerance:   fair    Final Location:   bed, bed alarm, all needs close, agrees to call for assistance and nurse notified     COMMUNICATION/EDUCATION:   Education: Patient, Benefit of activity while hospitalized, Call for assistance, Out of bed 2-3 times/day, Staff assistance with mobility, Changes positions frequently, Sit out of bed for 45-60 minutes or as tolerated, Safety, Functional mobility, Demonstrates adequately, Verbalized understanding, Teaching method, Verbal and Role of PT  Barriers to Learning/Limitations: None    Please refer to care plan and patient education section for further details.    Thank you for this referral.  Darnelle Going, PT, DPT  Pager [phone]

## 2018-09-03 NOTE — Progress Notes (Signed)
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

## 2018-09-25 DEATH — deceased

## 2022-02-24 ENCOUNTER — Encounter: Payer: Self-pay | Admitting: Hematology and Oncology

## 2022-03-23 ENCOUNTER — Other Ambulatory Visit: Payer: Self-pay | Admitting: Nurse Practitioner
